# Patient Record
Sex: Male | Born: 1955 | Race: White | Hispanic: No | Smoking: Current every day smoker
Health system: Southern US, Community
[De-identification: ages and names within clinical notes are randomized; demographics above are authoritative.]

## PROBLEM LIST (undated history)

## (undated) DIAGNOSIS — J449 Chronic obstructive pulmonary disease, unspecified: Secondary | ICD-10-CM

## (undated) DIAGNOSIS — M1990 Unspecified osteoarthritis, unspecified site: Secondary | ICD-10-CM

## (undated) HISTORY — PX: OTHER SURGICAL HISTORY: SHX169

---

## 1998-07-07 ENCOUNTER — Emergency Department (HOSPITAL_COMMUNITY): Admission: EM | Admit: 1998-07-07 | Discharge: 1998-07-07 | Payer: Self-pay | Admitting: Emergency Medicine

## 1998-07-07 ENCOUNTER — Encounter: Payer: Self-pay | Admitting: Emergency Medicine

## 2014-02-27 ENCOUNTER — Emergency Department: Payer: Self-pay | Admitting: Emergency Medicine

## 2014-02-27 LAB — CBC
HCT: 43.7 % (ref 40.0–52.0)
HGB: 14.8 g/dL (ref 13.0–18.0)
MCH: 34.6 pg — ABNORMAL HIGH (ref 26.0–34.0)
MCHC: 33.7 g/dL (ref 32.0–36.0)
MCV: 103 fL — ABNORMAL HIGH (ref 80–100)
Platelet: 320 10*3/uL (ref 150–440)
RBC: 4.26 10*6/uL — ABNORMAL LOW (ref 4.40–5.90)
RDW: 13.3 % (ref 11.5–14.5)
WBC: 6.6 10*3/uL (ref 3.8–10.6)

## 2014-02-27 LAB — PROTIME-INR
INR: 0.9
Prothrombin Time: 12.3 secs (ref 11.5–14.7)

## 2016-01-16 ENCOUNTER — Encounter: Payer: Self-pay | Admitting: Emergency Medicine

## 2016-01-16 ENCOUNTER — Emergency Department: Payer: BLUE CROSS/BLUE SHIELD

## 2016-01-16 ENCOUNTER — Inpatient Hospital Stay
Admission: EM | Admit: 2016-01-16 | Discharge: 2016-01-18 | DRG: 191 | Disposition: A | Discharge status: Home / Self Care | Payer: BLUE CROSS/BLUE SHIELD | Attending: Internal Medicine | Admitting: Internal Medicine

## 2016-01-16 DIAGNOSIS — E871 Hypo-osmolality and hyponatremia: Secondary | ICD-10-CM | POA: Diagnosis present

## 2016-01-16 DIAGNOSIS — F17210 Nicotine dependence, cigarettes, uncomplicated: Secondary | ICD-10-CM | POA: Diagnosis present

## 2016-01-16 DIAGNOSIS — J440 Chronic obstructive pulmonary disease with acute lower respiratory infection: Principal | ICD-10-CM | POA: Diagnosis present

## 2016-01-16 DIAGNOSIS — R0902 Hypoxemia: Secondary | ICD-10-CM

## 2016-01-16 DIAGNOSIS — Z8249 Family history of ischemic heart disease and other diseases of the circulatory system: Secondary | ICD-10-CM

## 2016-01-16 DIAGNOSIS — J441 Chronic obstructive pulmonary disease with (acute) exacerbation: Secondary | ICD-10-CM | POA: Diagnosis present

## 2016-01-16 DIAGNOSIS — J209 Acute bronchitis, unspecified: Secondary | ICD-10-CM | POA: Diagnosis present

## 2016-01-16 DIAGNOSIS — R0602 Shortness of breath: Secondary | ICD-10-CM | POA: Diagnosis present

## 2016-01-16 HISTORY — DX: Chronic obstructive pulmonary disease, unspecified: J44.9

## 2016-01-16 LAB — CBC
HCT: 46.2 % (ref 40.0–52.0)
HEMOGLOBIN: 15.9 g/dL (ref 13.0–18.0)
MCH: 34.3 pg — AB (ref 26.0–34.0)
MCHC: 34.4 g/dL (ref 32.0–36.0)
MCV: 99.8 fL (ref 80.0–100.0)
PLATELETS: 285 10*3/uL (ref 150–440)
RBC: 4.63 MIL/uL (ref 4.40–5.90)
RDW: 13.1 % (ref 11.5–14.5)
WBC: 5 10*3/uL (ref 3.8–10.6)

## 2016-01-16 LAB — TROPONIN I

## 2016-01-16 LAB — BASIC METABOLIC PANEL
ANION GAP: 9 (ref 5–15)
BUN: <5 mg/dL — ABNORMAL LOW (ref 6–20)
CALCIUM: 8.5 mg/dL — AB (ref 8.9–10.3)
CO2: 29 mmol/L (ref 22–32)
CREATININE: 0.41 mg/dL — AB (ref 0.61–1.24)
Chloride: 92 mmol/L — ABNORMAL LOW (ref 101–111)
Glucose, Bld: 125 mg/dL — ABNORMAL HIGH (ref 65–99)
Potassium: 3.1 mmol/L — ABNORMAL LOW (ref 3.5–5.1)
SODIUM: 130 mmol/L — AB (ref 135–145)

## 2016-01-16 LAB — OSMOLALITY: Osmolality: 274 mOsm/kg — ABNORMAL LOW (ref 275–295)

## 2016-01-16 LAB — BRAIN NATRIURETIC PEPTIDE: B NATRIURETIC PEPTIDE 5: 97 pg/mL (ref 0.0–100.0)

## 2016-01-16 MED ORDER — ACETAMINOPHEN 650 MG RE SUPP: 650.0000 mg | Freq: Four times a day (QID) | RECTAL | Status: DC | PRN

## 2016-01-16 MED ORDER — MOMETASONE FURO-FORMOTEROL FUM 200-5 MCG/ACT IN AERO
2.0000 | INHALATION_SPRAY | Freq: Two times a day (BID) | RESPIRATORY_TRACT | Status: DC
  Administered 2016-01-16 – 2016-01-18 (×4): 2 via RESPIRATORY_TRACT
  Filled 2016-01-16: qty 8.8

## 2016-01-16 MED ORDER — SODIUM CHLORIDE 0.9 % IV SOLN
INTRAVENOUS | Status: DC
  Administered 2016-01-16 – 2016-01-17 (×3): via INTRAVENOUS

## 2016-01-16 MED ORDER — SODIUM CHLORIDE 0.9% FLUSH
3.0000 mL | Freq: Two times a day (BID) | INTRAVENOUS | Status: DC
  Administered 2016-01-17: 3 mL via INTRAVENOUS

## 2016-01-16 MED ORDER — TIOTROPIUM BROMIDE MONOHYDRATE 18 MCG IN CAPS
18.0000 ug | ORAL_CAPSULE | Freq: Every day | RESPIRATORY_TRACT | Status: DC
  Administered 2016-01-16 – 2016-01-18 (×3): 18 ug via RESPIRATORY_TRACT
  Filled 2016-01-16 (×2): qty 5

## 2016-01-16 MED ORDER — IPRATROPIUM-ALBUTEROL 0.5-2.5 (3) MG/3ML IN SOLN
3.0000 mL | Freq: Four times a day (QID) | RESPIRATORY_TRACT | Status: DC
  Administered 2016-01-16 – 2016-01-18 (×7): 3 mL via RESPIRATORY_TRACT
  Filled 2016-01-16 (×7): qty 3

## 2016-01-16 MED ORDER — IPRATROPIUM-ALBUTEROL 0.5-2.5 (3) MG/3ML IN SOLN
3.0000 mL | Freq: Once | RESPIRATORY_TRACT | Status: AC
  Administered 2016-01-16: 3 mL via RESPIRATORY_TRACT
  Filled 2016-01-16: qty 3

## 2016-01-16 MED ORDER — LEVOFLOXACIN IN D5W 750 MG/150ML IV SOLN
750.0000 mg | Freq: Once | INTRAVENOUS | Status: AC
  Administered 2016-01-16: 750 mg via INTRAVENOUS
  Filled 2016-01-16: qty 150

## 2016-01-16 MED ORDER — ACETAMINOPHEN 325 MG PO TABS: 650.0000 mg | ORAL_TABLET | Freq: Four times a day (QID) | ORAL | Status: DC | PRN

## 2016-01-16 MED ORDER — INFLUENZA VAC SPLIT QUAD 0.5 ML IM SUSY: 0.5000 mL | PREFILLED_SYRINGE | INTRAMUSCULAR | Status: DC

## 2016-01-16 MED ORDER — METHYLPREDNISOLONE SODIUM SUCC 125 MG IJ SOLR
60.0000 mg | Freq: Four times a day (QID) | INTRAMUSCULAR | Status: DC
  Administered 2016-01-17 – 2016-01-18 (×6): 60 mg via INTRAVENOUS
  Filled 2016-01-16 (×6): qty 2

## 2016-01-16 MED ORDER — ENOXAPARIN SODIUM 40 MG/0.4ML ~~LOC~~ SOLN: 40.0000 mg | SUBCUTANEOUS | Status: DC

## 2016-01-16 MED ORDER — METHYLPREDNISOLONE SODIUM SUCC 125 MG IJ SOLR
60.0000 mg | Freq: Four times a day (QID) | INTRAMUSCULAR | Status: DC
  Administered 2016-01-16: 60 mg via INTRAVENOUS
  Filled 2016-01-16: qty 2

## 2016-01-16 MED ORDER — ENOXAPARIN SODIUM 30 MG/0.3ML ~~LOC~~ SOLN
30.0000 mg | SUBCUTANEOUS | Status: DC
  Administered 2016-01-16 – 2016-01-17 (×2): 30 mg via SUBCUTANEOUS
  Filled 2016-01-16 (×2): qty 0.3

## 2016-01-16 MED ORDER — PNEUMOCOCCAL VAC POLYVALENT 25 MCG/0.5ML IJ INJ: 0.5000 mL | INJECTION | INTRAMUSCULAR | Status: DC

## 2016-01-16 MED ORDER — LEVOFLOXACIN IN D5W 750 MG/150ML IV SOLN: 750.0000 mg | Freq: Once | INTRAVENOUS | Status: DC

## 2016-01-16 MED ORDER — POTASSIUM CHLORIDE CRYS ER 20 MEQ PO TBCR
40.0000 meq | EXTENDED_RELEASE_TABLET | ORAL | Status: AC
  Administered 2016-01-16 (×2): 40 meq via ORAL
  Filled 2016-01-16 (×2): qty 2

## 2016-01-16 MED ORDER — ONDANSETRON HCL 4 MG/2ML IJ SOLN: 4.0000 mg | Freq: Four times a day (QID) | INTRAMUSCULAR | Status: DC | PRN

## 2016-01-16 MED ORDER — ONDANSETRON HCL 4 MG PO TABS: 4.0000 mg | ORAL_TABLET | Freq: Four times a day (QID) | ORAL | Status: DC | PRN

## 2016-01-16 MED ORDER — NICOTINE 21 MG/24HR TD PT24
21.0000 mg | MEDICATED_PATCH | Freq: Every day | TRANSDERMAL | Status: DC
  Administered 2016-01-16 – 2016-01-18 (×3): 21 mg via TRANSDERMAL
  Filled 2016-01-16 (×3): qty 1

## 2016-01-16 MED ORDER — LEVOFLOXACIN IN D5W 500 MG/100ML IV SOLN
500.0000 mg | INTRAVENOUS | Status: DC
  Administered 2016-01-17 – 2016-01-18 (×2): 500 mg via INTRAVENOUS
  Filled 2016-01-16 (×2): qty 100

## 2016-01-16 MED ORDER — SODIUM CHLORIDE 0.9% FLUSH: 3.0000 mL | INTRAVENOUS | Status: DC | PRN

## 2016-01-16 MED ORDER — METHYLPREDNISOLONE SODIUM SUCC 125 MG IJ SOLR
125.0000 mg | Freq: Once | INTRAMUSCULAR | Status: AC
  Administered 2016-01-16: 125 mg via INTRAVENOUS
  Filled 2016-01-16: qty 2

## 2016-01-16 MED ORDER — SODIUM CHLORIDE 0.9 % IV SOLN
250.0000 mL | INTRAVENOUS | Status: DC | PRN
Start: 2016-01-16 — End: 2016-01-18

## 2016-01-16 NOTE — ED Notes (Signed)
EKG report given to MD.

## 2016-01-16 NOTE — H&P (Addendum)
[facility] [place] at [facility]   PATIENT NAME: Michael Padilla    MR#:  [id number]  DATE OF BIRTH:  Apr 07, 1956  DATE OF ADMISSION:  01/16/2016  PRIMARY CARE PHYSICIAN: No primary care provider on file.   REQUESTING/REFERRING PHYSICIAN: McShane MD  CHIEF COMPLAINT:   Chief Complaint  Patient presents with  . Shortness of Breath    HISTORY OF PRESENT ILLNESS: Michael Padilla  is a 60 y.o. male with a known history of  COPD and nicotine addiction who presents with shortness of breath and cough for one weeks duration. Patient reports that he has a history of COPD and has not use any medications in the past 7-8 years. He continues to smoke more than a pack a day. He reports that his been coughing and wheezing. Has not had any fevers or chills. Denies any chest pains.  PAST MEDICAL HISTORY:   Past Medical History  Diagnosis Date  . COPD (chronic obstructive pulmonary disease) (HCC)     PAST SURGICAL HISTORY: Past Surgical History  Procedure Laterality Date  . None      SOCIAL HISTORY:  Social History  Substance Use Topics  . Smoking status: Current Every Day Smoker -- 1.00 packs/day  . Smokeless tobacco: Not on file  . Alcohol Use: 0.6 oz/week    1 Standard drinks or equivalent per week    FAMILY HISTORY:  Family History  Problem Relation Age of Onset  . Hypertension Mother     DRUG ALLERGIES: No Known Allergies  REVIEW OF SYSTEMS:   CONSTITUTIONAL: No fever, fatigue or weakness.  EYES: No blurred or double vision.  EARS, NOSE, AND THROAT: No tinnitus or ear pain.  RESPIRATORY: Positive cough and shortness of breath, no wheezing or hemoptysis.  CARDIOVASCULAR: No chest pain, orthopnea, edema.  GASTROINTESTINAL: No nausea, vomiting, diarrhea or abdominal pain.  GENITOURINARY: No dysuria, hematuria.  ENDOCRINE: No polyuria, nocturia,  HEMATOLOGY: No anemia, easy bruising or bleeding SKIN: No rash or lesion. MUSCULOSKELETAL: No joint pain  or arthritis.   NEUROLOGIC: No tingling, numbness, weakness.  PSYCHIATRY: No anxiety or depression.   MEDICATIONS AT HOME:  Prior to Admission medications   Not on File      PHYSICAL EXAMINATION:   VITAL SIGNS: Blood pressure 112/82, pulse 73, temperature 97.6 F (36.4 C), temperature source Oral, resp. rate 18, height 5\' 7"  (1.702 m), weight 55.339 kg (122 lb), SpO2 92 %.  GENERAL:  60 y.o.-year-old patient lying in the bed with no acute distress.  EYES: Pupils equal, round, reactive to light and accommodation. No scleral icterus. Extraocular muscles intact.  HEENT: Head atraumatic, normocephalic. Oropharynx and nasopharynx clear.  NECK:  Supple, no jugular venous distention. No thyroid enlargement, no tenderness.  LUNGS: Decreased breath sounds bilaterally without any rales rhonchi or wheezing  CARDIOVASCULAR: S1, S2 normal. No murmurs, rubs, or gallops.  ABDOMEN: Soft, nontender, nondistended. Bowel sounds present. No organomegaly or mass.  EXTREMITIES: No pedal edema, cyanosis, or clubbing.  NEUROLOGIC: Cranial nerves II through XII are intact. Muscle strength 5/5 in all extremities. Sensation intact. Gait not checked.  PSYCHIATRIC: The patient is alert and oriented x 3.  SKIN: No obvious rash, lesion, or ulcer.   LABORATORY PANEL:   CBC  Recent Labs Lab 01/16/16 1130  WBC 5.0  HGB 15.9  HCT 46.2  PLT 285  MCV 99.8  MCH 34.3*  MCHC 34.4  RDW 13.1   ------------------------------------------------------------------------------------------------------------------  Chemistries   Recent Labs Lab 01/16/16 1130  NA 130*  K 3.1*  CL 92*  CO2 29  GLUCOSE 125*  BUN <5*  CREATININE 0.41*  CALCIUM 8.5*   ------------------------------------------------------------------------------------------------------------------ estimated creatinine clearance is 76.8 mL/min (by C-G formula based on Cr of  0.41). ------------------------------------------------------------------------------------------------------------------ No results for input(s): TSH, T4TOTAL, T3FREE, THYROIDAB in the last 72 hours.  Invalid input(s): FREET3   Coagulation profile No results for input(s): INR, PROTIME in the last 168 hours. ------------------------------------------------------------------------------------------------------------------- No results for input(s): DDIMER in the last 72 hours. -------------------------------------------------------------------------------------------------------------------  Cardiac Enzymes  Recent Labs Lab 01/16/16 1130 01/16/16 1404  TROPONINI <0.03 <0.03   ------------------------------------------------------------------------------------------------------------------ Invalid input(s): POCBNP  ---------------------------------------------------------------------------------------------------------------  Urinalysis No results found for: COLORURINE, APPEARANCEUR, LABSPEC, PHURINE, GLUCOSEU, HGBUR, BILIRUBINUR, KETONESUR, PROTEINUR, UROBILINOGEN, NITRITE, LEUKOCYTESUR   RADIOLOGY: Dg Chest 2 View  01/16/2016  CLINICAL DATA:  Cough and weakness for 1 week.  Smoker. EXAM: CHEST  2 VIEW COMPARISON:  None. FINDINGS: Advanced COPD with hyperinflation. No areas of consolidation or volume loss. Chronic appearing CP angle blunting. Normal heart size. Thoracic atherosclerosis. Coronary artery calcification. Thoracic spine degenerative change. IMPRESSION: Advanced COPD.  No definite active infiltrates or failure. Electronically Signed   By: Elsie Stain M.D.   On: 01/16/2016 11:46    EKG: Orders placed or performed during the hospital encounter of 01/16/16  . ED EKG  . ED EKG    IMPRESSION AND PLAN: Patient is a 60 year old with nicotine addiction presents with shortness of breath and cough  1. Acute on chronic COPD exasperation: At this time will admit the patient  to the hospital place him on nebulizers and Solu-Medrol. And antibiotics for acute bronchitis. I will start him on inhalers and use as well. Patient will  need outpatient pulmonary follow-up.  2. Acute bronchitis levaquin   3. Hyponatremia could be due to dehydration also SIADH possible with his severe COPD changes on the x-ray at this time will try normal saline. Recheck potassium in the morning I'll also check serum and urine osmolalities  4. Nicotine addiction: smoking cessation provided 4 min spent, nicotine patch started      All the records are reviewed and case discussed with ED provider. Management plans discussed with the patient, family and they are in agreement.  CODE STATUS: Code Status History    This patient does not have a recorded code status. Please follow your organizational policy for patients in this situation.       TOTAL TIME TAKING CARE OF THIS PATIENT: 55 minutes.    Auburn Bilberry M.D on 01/16/2016 at 4:13 PM  Between 7am to 6pm - Pager - [phone]  After 6pm go to [email or web address] - password [email or web address] [facility]  [place] [facility]  Office  [phone]  CC: Primary care physician; No primary care provider on file.

## 2016-01-16 NOTE — ED Notes (Signed)
Pt placed on 2L O2 via n/c for O2 sat 86-87% by MD.

## 2016-01-16 NOTE — ED Notes (Signed)
SOB x 1 week, denies fevers.

## 2016-01-16 NOTE — ED Provider Notes (Addendum)
[facility] Emergency Department Provider Note  ____________________________________________   I have reviewed the triage vital signs and the nursing notes.   HISTORY  Chief Complaint Shortness of Breath    HPI Michael Padilla is a 60 y.o. male who presents today complaining of cough. He has a history of tobacco abuse for multiple years. He states he has been having a cough for the last week which is deep and productive occasionally. No fever. He went to an urgent care and they told him that his socks and sats were somewhat low and he was sent here. Patient denies any chest pain, no lower extremity edema. He does state he is getting winded when he walks around. This is she states because of the cough. He denies any nausea or diaphoresis. No other URI symptoms. Does not know himself to have a history of COPD. Symptoms of been there for approximately one week.  History reviewed. No pertinent past medical history.  There are no active problems to display for this patient.   History reviewed. No pertinent past surgical history.  No current outpatient prescriptions on file.  Allergies Review of patient's allergies indicates no known allergies.  No family history on file.  Social History Social History  Substance Use Topics  . Smoking status: Current Every Day Smoker -- 1.00 packs/day  . Smokeless tobacco: None  . Alcohol Use: Yes    Review of Systems Constitutional: No fever/chills Eyes: No visual changes. ENT: No sore throat. No stiff neck no neck pain Cardiovascular: Denies chest pain. Respiratory: See history of present illness or shortness of breath Gastrointestinal:   no vomiting.  No diarrhea.  No constipation. Genitourinary: Negative for dysuria. Musculoskeletal: Negative lower extremity swelling Skin: Negative for rash. Neurological: Negative for headaches, focal weakness or numbness. 10-point ROS otherwise  negative.  ____________________________________________   PHYSICAL EXAM:  VITAL SIGNS: ED Triage Vitals  Enc Vitals Group     BP 01/16/16 1101 156/90 mmHg     Pulse Rate 01/16/16 1101 93     Resp 01/16/16 1101 22     Temp 01/16/16 1101 97.6 F (36.4 C)     Temp Source 01/16/16 1101 Oral     SpO2 01/16/16 1101 93 %     Weight 01/16/16 1101 122 lb (55.339 kg)     Height 01/16/16 1101  (1.702 m)     Head Cir --      Peak Flow --      Pain Score --      Pain Loc --      Pain Edu? --      Excl. in GC? --     Constitutional: Alert and oriented. Well appearing and in no acute distress.Sitting in the chair reading a newspaper with his legs crossed Eyes: Conjunctivae are normal. PERRL. EOMI. Head: Atraumatic. Nose: No congestion/rhinnorhea. Mouth/Throat: Mucous membranes are moist.  Oropharynx non-erythematous. Neck: No stridor.   Nontender with no meningismus Cardiovascular: Normal rate, regular rhythm. Grossly normal heart sounds.  Good peripheral circulation. Respiratory: Normal respiratory effort.  No retractions. Diminished in the bases otherwise no acute pathology Abdominal: Soft and nontender. No distention. No guarding no rebound Back:  There is no focal tenderness or step off there is no midline tenderness there are no lesions noted. there is no CVA tenderness Musculoskeletal: No lower extremity tenderness. No joint effusions, no DVT signs strong distal pulses no edema Neurologic:  Normal speech and language. No gross focal neurologic deficits are appreciated.  Skin:  Skin is warm, dry and intact. No rash noted. Psychiatric: Mood and affect are normal. Speech and behavior are normal.  ____________________________________________   LABS (all labs ordered are listed, but only abnormal results are displayed)  Labs Reviewed  CBC - Abnormal; Notable for the following:    MCH 34.3 (*)    All other components within normal limits  BASIC METABOLIC PANEL  TROPONIN I   BRAIN NATRIURETIC PEPTIDE   ____________________________________________  EKG  I personally interpreted any EKGs ordered by me or triage Normal sinus rhythm rate 74 bpm, there is are BB and LAFB. There is repolarization appearing with good morphology but nonetheless present S relation to 3 and aVF which I do not take to be acutely ischemic ____________________________________________  RADIOLOGY  I reviewed any imaging ordered by me or triage that were performed during my shift and, if possible, patient and/or family made aware of any abnormal findings. ____________________________________________   PROCEDURES  Procedure(s) performed: None  Critical Care performed: None  ____________________________________________   INITIAL IMPRESSION / ASSESSMENT AND PLAN / ED COURSE  Pertinent labs & imaging results that were available during my care of the patient were reviewed by me and considered in my medical decision making (see chart for details).  Patient here with a cough. We gave him an albuterol treatment is sats of improved. He is in no acute distress. There is no evidence of pneumonia on chest x-ray. He is not having any chest pain however he does have exertional dyspnea has been smoking for years. I suspect this is likely to a COPD flare and we will treat it as such however as a precaution I did do an EKG and cardiac enzymes because of his significant smoking history. If troponin is negative, we'll reassess. Patient in no distress and has had no chest pain. EKG is noted, this is not known to be acute, and again with no chest pain and no symptoms at this time I will not send to the Cath Lab based on this EKG  ----------------------------------------- 2:18 PM on 01/16/2016 -----------------------------------------  Workup reassuring here however patient persistently hypoxic. His sats are 6686 when I'm in the room after I ambulate him. Routinely he is down to 88-89%. This may be  baseline for him but it is not known to be. Family states that he has been much more sleepy recently. We will admit him for further evaluation of his persistent recurrent hypoxia while here. ____________________________________________   FINAL CLINICAL IMPRESSION(S) / ED DIAGNOSES  Final diagnoses:  None      This chart was dictated using voice recognition software.  Despite best efforts to proofread,  errors can occur which can change meaning.     Jeanmarie PlantJames A Anuja Manka, MD 01/16/16 1212  Jeanmarie PlantJames A Caylor Cerino, MD 01/16/16 1420

## 2016-01-17 LAB — BASIC METABOLIC PANEL
Anion gap: 7 (ref 5–15)
BUN: 6 mg/dL (ref 6–20)
CHLORIDE: 100 mmol/L — AB (ref 101–111)
CO2: 26 mmol/L (ref 22–32)
Calcium: 8.2 mg/dL — ABNORMAL LOW (ref 8.9–10.3)
Creatinine, Ser: 0.39 mg/dL — ABNORMAL LOW (ref 0.61–1.24)
GFR calc non Af Amer: >60 mL/min (ref >60)
Glucose, Bld: 160 mg/dL — ABNORMAL HIGH (ref 65–99)
POTASSIUM: 4.1 mmol/L (ref 3.5–5.1)
SODIUM: 133 mmol/L — AB (ref 135–145)

## 2016-01-17 LAB — CBC
HCT: 42 % (ref 40.0–52.0)
HEMOGLOBIN: 14.5 g/dL (ref 13.0–18.0)
MCH: 33.9 pg (ref 26.0–34.0)
MCHC: 34.5 g/dL (ref 32.0–36.0)
MCV: 98.5 fL (ref 80.0–100.0)
Platelets: 302 10*3/uL (ref 150–440)
RBC: 4.26 MIL/uL — AB (ref 4.40–5.90)
RDW: 13 % (ref 11.5–14.5)
WBC: 6.5 10*3/uL (ref 3.8–10.6)

## 2016-01-17 NOTE — Plan of Care (Signed)
Problem: Phase I Progression Outcomes Goal: Flu/PneumoVaccines if indicated Outcome: Not Met (add Reason) Pt refused  Problem: Discharge Progression Outcomes Goal: Flu vaccine received if indicated Outcome: Not Met (add Reason) Pt refused Goal: Pneumonia vaccine received if indicated Outcome: Not Met (add Reason) Pt refused

## 2016-01-17 NOTE — Progress Notes (Signed)
[facility] [place] at [facility]   PATIENT NAME: Michael Padilla    MR#:  [id number]  DATE OF BIRTH:  Feb 06, 1956  SUBJECTIVE:  Shortness of breath and cough improved, but still needs oxygen  REVIEW OF SYSTEMS:  CONSTITUTIONAL: No fever, fatigue or weakness.  EYES: No blurred or double vision.  EARS, NOSE, AND THROAT: No tinnitus or ear pain.  RESPIRATORY: positive cough, shortness of breath, wheezing denies hemoptysis.  CARDIOVASCULAR: No chest pain, orthopnea, edema.  GASTROINTESTINAL: No nausea, vomiting, diarrhea or abdominal pain.  GENITOURINARY: No dysuria, hematuria.  ENDOCRINE: No polyuria, nocturia,  HEMATOLOGY: No anemia, easy bruising or bleeding SKIN: No rash or lesion. MUSCULOSKELETAL: No joint pain or arthritis.   NEUROLOGIC: No tingling, numbness, weakness.  PSYCHIATRY: No anxiety or depression.   DRUG ALLERGIES:  No Known Allergies  VITALS:  Blood pressure 114/70, pulse 78, temperature 98.1 F (36.7 C), temperature source Oral, resp. rate 20, height 5\' 7"  (1.702 m), weight 53.298 kg (117 lb 8 oz), SpO2 95 %.  PHYSICAL EXAMINATION:  VITAL SIGNS: Filed Vitals:   01/17/16 0606 01/17/16 1300  BP: 117/72 114/70  Pulse: 70 78  Temp: 98 F (36.7 C) 98.1 F (36.7 C)  Resp: 18 20   GENERAL:60 y.o.male currently in no acute distress.  HEAD: Normocephalic, atraumatic.  EYES: Pupils equal, round, reactive to light. Extraocular muscles intact. No scleral icterus.  MOUTH: Moist mucosal membrane. Dentition intact. No abscess noted.  EAR, NOSE, THROAT: Clear without exudates. No external lesions.  NECK: Supple. No thyromegaly. No nodules. No JVD.  PULMONARY: scant expiratory wheeze without rails or rhonci. No use of accessory muscles, Good respiratory effort. good air entry bilaterally CHEST: Nontender to palpation.  CARDIOVASCULAR: S1 and S2. Regular rate and rhythm. No murmurs, rubs, or gallops. No edema. Pedal pulses 2+ bilaterally.   GASTROINTESTINAL: Soft, nontender, nondistended. No masses. Positive bowel sounds. No hepatosplenomegaly.  MUSCULOSKELETAL: No swelling, clubbing, or edema. Range of motion full in all extremities.  NEUROLOGIC: Cranial nerves II through XII are intact. No gross focal neurological deficits. Sensation intact. Reflexes intact.  SKIN: No ulceration, lesions, rashes, or cyanosis. Skin warm and dry. Turgor intact.  PSYCHIATRIC: Mood, affect within normal limits. The patient is awake, alert and oriented x 3. Insight, judgment intact.      LABORATORY PANEL:   CBC  Recent Labs Lab 01/17/16 0306  WBC 6.5  HGB 14.5  HCT 42.0  PLT 302   ------------------------------------------------------------------------------------------------------------------  Chemistries   Recent Labs Lab 01/17/16 0306  NA 133*  K 4.1  CL 100*  CO2 26  GLUCOSE 160*  BUN 6  CREATININE 0.39*  CALCIUM 8.2*   ------------------------------------------------------------------------------------------------------------------  Cardiac Enzymes  Recent Labs Lab 01/16/16 1404  TROPONINI <0.03   ------------------------------------------------------------------------------------------------------------------  RADIOLOGY:  Dg Chest 2 View  01/16/2016  CLINICAL DATA:  Cough and weakness for 1 week.  Smoker. EXAM: CHEST  2 VIEW COMPARISON:  None. FINDINGS: Advanced COPD with hyperinflation. No areas of consolidation or volume loss. Chronic appearing CP angle blunting. Normal heart size. Thoracic atherosclerosis. Coronary artery calcification. Thoracic spine degenerative change. IMPRESSION: Advanced COPD.  No definite active infiltrates or failure. Electronically Signed   By: Elsie StainJohn T Curnes M.D.   On: 01/16/2016 11:46    EKG:   Orders placed or performed during the hospital encounter of 01/16/16  . ED EKG  . ED EKG    ASSESSMENT AND PLAN:   60 year old caucasian male admitted 01/16/16 with copd exacerbation  1. Chronic obstructive pulmonary disease exacerbation: Provide DuoNeb treatments q. 4 hours, decrease steroids, continue levaquin 3/5. Continue with home medications.  2. Hyponatremia: gentle iv fluids, decrease rate 3. Venous thromboembolism prophylatic: lovenox       All the records are reviewed and case discussed with Care Management/Social Workerr. Management plans discussed with the patient, family and they are in agreement.  CODE STATUS: full  TOTAL TIME TAKING CARE OF THIS PATIENT: 28 minutes.   POSSIBLE D/C IN 1-2 DAYS, DEPENDING ON CLINICAL CONDITION.   [name],  [name].D on 01/17/2016 at 3:15 PM  Between 7am to 6pm - Pager - [phone]  After 6pm: House Pager: - [phone]  [name]  Office  [phone]  CC: Primary care physician; No primary care provider on file.

## 2016-01-18 MED ORDER — PREDNISONE 10 MG (21) PO TBPK: 10.0000 mg | ORAL_TABLET | Freq: Every day | ORAL | Status: DC

## 2016-01-18 MED ORDER — MOMETASONE FURO-FORMOTEROL FUM 200-5 MCG/ACT IN AERO: 2.0000 | INHALATION_SPRAY | Freq: Two times a day (BID) | RESPIRATORY_TRACT | Status: DC

## 2016-01-18 MED ORDER — TIOTROPIUM BROMIDE MONOHYDRATE 18 MCG IN CAPS: 18.0000 ug | ORAL_CAPSULE | Freq: Every day | RESPIRATORY_TRACT | Status: DC

## 2016-01-18 NOTE — Progress Notes (Signed)
To whomever it may concern  Michael Padilla  [facility] 01/16/2016 -- 01/18/2016 and will be able to return to work 01/21/16 unless feels better and thinks is able to return sooner Please call with concerns/questions Marge Duncansave Kimberli Winne MD [phone]

## 2016-01-18 NOTE — Discharge Summary (Signed)
[facility] - [place] at [facility]   PATIENT NAME: Michael Padilla    MR#:  [id number]  DATE OF BIRTH:  Mar 06, 1956  DATE OF ADMISSION:  01/16/2016 ADMITTING PHYSICIAN: Auburn Bilberry, MD  DATE OF DISCHARGE: No discharge date for patient encounter.  PRIMARY CARE PHYSICIAN: No primary care provider on file.    ADMISSION DIAGNOSIS:  Hypoxia [R09.02] Chronic obstructive pulmonary disease with acute exacerbation (HCC) [J44.1]  DISCHARGE DIAGNOSIS:  COPD exacerbation-improved  SECONDARY DIAGNOSIS:   Past Medical History  Diagnosis Date  . COPD (chronic obstructive pulmonary disease) [facility])     HOSPITAL COURSE:  Michael Padilla  is a 60 y.o. male admitted 01/16/2016 with chief complaint shortness of breath. Please see H&P performed by Dr. Allena Katz for further information. On admission patient started on breathing treatments, steroids as well as Levaquin for COPD exacerbation. He originally required oxygen supplementation to maintain oxygen saturation. Throughout hospitalization he has made improvement and is stable for discharge.  DISCHARGE CONDITIONS:   Stable/improved  CONSULTS OBTAINED:     DRUG ALLERGIES:  No Known Allergies  DISCHARGE MEDICATIONS:   Current Discharge Medication List    START taking these medications   Details  mometasone-formoterol (DULERA) 200-5 MCG/ACT AERO Inhale 2 puffs into the lungs 2 (two) times daily. Qty: 8.8 g, Refills: 0    predniSONE (STERAPRED UNI-PAK 21 TAB) 10 MG (21) TBPK tablet Take 1 tablet (10 mg total) by mouth daily.  oral 1 day, then  oral for 2 days, then  oral 2 days, then stop Qty: 10 tablet, Refills: 0    tiotropium (SPIRIVA) 18 MCG inhalation capsule Place 1 capsule (18 mcg total) into inhaler and inhale daily. Qty: 30 capsule, Refills: 12         DISCHARGE INSTRUCTIONS:    DIET:  Regular diet  DISCHARGE CONDITION:  Stable  ACTIVITY:  Activity as tolerated  OXYGEN:  Home  Oxygen: No.   Oxygen Delivery: room air  DISCHARGE LOCATION:  home   If you experience worsening of your admission symptoms, develop shortness of breath, life threatening emergency, suicidal or homicidal thoughts you must seek medical attention immediately by calling 911 or calling your MD immediately  if symptoms less severe.  You Must read complete instructions/literature along with all the possible adverse reactions/side effects for all the Medicines you take and that have been prescribed to you. Take any new Medicines after you have completely understood and accpet all the possible adverse reactions/side effects.   Please note  You were cared for by a hospitalist during your hospital stay. If you have any questions about your discharge medications or the care you received while you were in the hospital after you are discharged, you can call the unit and asked to speak with the hospitalist on call if the hospitalist that took care of you is not available. Once you are discharged, your primary care physician will handle any further medical issues. Please note that NO REFILLS for any discharge medications will be authorized once you are discharged, as it is imperative that you return to your primary care physician (or establish a relationship with a primary care physician if you do not have one) for your aftercare needs so that they can reassess your need for medications and monitor your lab values.    On the day of Discharge:   VITAL SIGNS:  Blood pressure 131/77, pulse 87, temperature 97.9 F (36.6 C), temperature source Axillary, resp. rate 17, height  (1.702 m),  weight 53.298 kg (117 lb 8 oz), SpO2 91 %.  I/O:   Intake/Output Summary (Last 24 hours) at 01/18/16 0858 Last data filed at 01/18/16 [id number]  Gross per 24 hour  Intake 3149.05 ml  Output   1450 ml  Net 1699.05 ml    PHYSICAL EXAMINATION:  GENERAL:  60 y.o.-year-old patient lying in the bed with no acute distress.   EYES: Pupils equal, round, reactive to light and accommodation. No scleral icterus. Extraocular muscles intact.  HEENT: Head atraumatic, normocephalic. Oropharynx and nasopharynx clear.  NECK:  Supple, no jugular venous distention. No thyroid enlargement, no tenderness.  LUNGS: Normal breath sounds bilaterally, no wheezing, rales,rhonchi or crepitation. No use of accessory muscles of respiration.  CARDIOVASCULAR: S1, S2 normal. No murmurs, rubs, or gallops.  ABDOMEN: Soft, non-tender, non-distended. Bowel sounds present. No organomegaly or mass.  EXTREMITIES: No pedal edema, cyanosis, or clubbing.  NEUROLOGIC: Cranial nerves II through XII are intact. Muscle strength 5/5 in all extremities. Sensation intact. Gait not checked.  PSYCHIATRIC: The patient is alert and oriented x 3.  SKIN: No obvious rash, lesion, or ulcer.   DATA REVIEW:   CBC  Recent Labs Lab 01/17/16 0306  WBC 6.5  HGB 14.5  HCT 42.0  PLT 302    Chemistries   Recent Labs Lab 01/17/16 0306  NA 133*  K 4.1  CL 100*  CO2 26  GLUCOSE 160*  BUN 6  CREATININE 0.39*  CALCIUM 8.2*    Cardiac Enzymes  Recent Labs Lab 01/16/16 1404  TROPONINI <0.03    Microbiology Results  No results found for this or any previous visit.  RADIOLOGY:  Dg Chest 2 View  01/16/2016  CLINICAL DATA:  Cough and weakness for 1 week.  Smoker. EXAM: CHEST  2 VIEW COMPARISON:  None. FINDINGS: Advanced COPD with hyperinflation. No areas of consolidation or volume loss. Chronic appearing CP angle blunting. Normal heart size. Thoracic atherosclerosis. Coronary artery calcification. Thoracic spine degenerative change. IMPRESSION: Advanced COPD.  No definite active infiltrates or failure. Electronically Signed   By: Elsie StainJohn T Curnes M.D.   On: 01/16/2016 11:46     Management plans discussed with the patient, family and they are in agreement.  CODE STATUS:     Code Status Orders        Start     Ordered   01/16/16 1747  Full code    Continuous     01/16/16 1746    Code Status History    Date Active Date Inactive Code Status Order ID Comments User Context   This patient has a current code status but no historical code status.      TOTAL TIME TAKING CARE OF THIS PATIENT: 28 minutes.    Keyasia Jolliff,  Mardi MainlandDavid K M.D on 01/18/2016 at 8:58 AM  Between 7am to 6pm - Pager - [phone]  After 6pm go to [email or web address] - password [email or web address] [facility]  [facility]  Office  [phone]  CC: Primary care physician; No primary care provider on file.

## 2016-01-18 NOTE — Progress Notes (Signed)
Pt A and O x 4. VSS. Pt tolerating diet well. No complaints of pain or nausea. IV removed intact, prescriptions given. Pt voiced understanding of discharge instructions with no further questions. Pt discharged via wheelchair with axillary.   

## 2016-01-18 NOTE — Progress Notes (Signed)
Notified Dr. Clint GuyHower of O2 sats on exertion. No complaints of shortness of breath. 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 MD believes that pt does not need home O2.

## 2016-12-16 ENCOUNTER — Emergency Department
Admission: EM | Admit: 2016-12-16 | Discharge: 2016-12-16 | Disposition: A | Discharge status: Home / Self Care | Payer: BLUE CROSS/BLUE SHIELD | Attending: Emergency Medicine | Admitting: Emergency Medicine

## 2016-12-16 ENCOUNTER — Emergency Department: Payer: BLUE CROSS/BLUE SHIELD

## 2016-12-16 DIAGNOSIS — M79605 Pain in left leg: Secondary | ICD-10-CM | POA: Diagnosis present

## 2016-12-16 DIAGNOSIS — F17200 Nicotine dependence, unspecified, uncomplicated: Secondary | ICD-10-CM | POA: Diagnosis not present

## 2016-12-16 DIAGNOSIS — J449 Chronic obstructive pulmonary disease, unspecified: Secondary | ICD-10-CM | POA: Insufficient documentation

## 2016-12-16 DIAGNOSIS — M5410 Radiculopathy, site unspecified: Secondary | ICD-10-CM | POA: Diagnosis not present

## 2016-12-16 MED ORDER — MELOXICAM 15 MG PO TABS: 15.0000 mg | ORAL_TABLET | Freq: Every day | ORAL | 0 refills | Status: DC

## 2016-12-16 NOTE — ED Notes (Signed)
Pt c/o L leg pain that started yesterday while he was in the shower. Pt c/o pain from L hip to L knee that is sharp shooting in nature, and heaviness in his calf. Denies any redness, swelling, heat to the touch. Pt is able to walk with some difficulty at this time.

## 2016-12-16 NOTE — Discharge Instructions (Signed)
Your x-rays show some degenerative changes in your lower back. Follow up with either your primary care doctor or orthopedics. Take the meloxicam daily.

## 2016-12-16 NOTE — ED Provider Notes (Signed)
[facility] Emergency Department Provider Note ____________________________________________  Time seen: Approximately 12:58 PM  I have reviewed the triage vital signs and the nursing notes.   HISTORY  Chief Complaint Leg Pain    HPI Michael Padilla is a 61 y.o. male who presents to the emergency department for evaluation of left leg pain. He states that yesterday, he was getting out of the shower and his leg "gave out." He states that today he has had sharp shooting pains down the front of his leg. He denies hip pain or knee pain. He does have a history of low back pain that radiated into his right leg in the past, but never the left. He has not taken any medications for his current symptoms.   Past Medical History:  Diagnosis Date  . COPD (chronic obstructive pulmonary disease) [facility])     Patient Active Problem List   Diagnosis Date Noted  . SOB (shortness of breath) 01/16/2016    Past Surgical History:  Procedure Laterality Date  . none      Prior to Admission medications   Medication Sig Start Date End Date Taking? Authorizing Provider  meloxicam (MOBIC) 15 MG tablet Take 1 tablet (15 mg total) by mouth daily. 12/16/16   Chinita Pester, FNP  mometasone-formoterol (DULERA) 200-5 MCG/ACT AERO Inhale 2 puffs into the lungs 2 (two) times daily. 01/18/16   Wyatt Haste, MD  predniSONE (STERAPRED UNI-PAK 21 TAB) 10 MG (21) TBPK tablet Take 1 tablet (10 mg total) by mouth daily. 40mg  oral 1 day, then 20mg  oral for 2 days, then 10mg  oral 2 days, then stop 01/18/16   Wyatt Haste, MD  tiotropium (SPIRIVA) 18 MCG inhalation capsule Place 1 capsule (18 mcg total) into inhaler and inhale daily. 01/18/16   Wyatt Haste, MD    Allergies Patient has no known allergies.  Family History  Problem Relation Age of Onset  . Hypertension Mother     Social History Social History  Substance Use Topics  . Smoking status: Current Every Day Smoker    Packs/day:  1.00  . Smokeless tobacco: Not on file  . Alcohol use 0.6 oz/week    1 Standard drinks or equivalent per week    Review of Systems Constitutional: No recent illness. Cardiovascular: Denies chest pain or palpitations. Respiratory: Denies shortness of breath. Musculoskeletal: Pain in left upper leg. Skin: Negative for rash, wound, lesion. Neurological: Negative for focal weakness or numbness.  ____________________________________________   PHYSICAL EXAM:  VITAL SIGNS: ED Triage Vitals  Enc Vitals Group     BP 12/16/16 0823 (!) 152/93     Pulse Rate 12/16/16 0823 (!) 109     Resp 12/16/16 0823 18     Temp 12/16/16 0823 98.9 F (37.2 C)     Temp Source 12/16/16 0823 Oral     SpO2 12/16/16 0823 96 %     Weight 12/16/16 0823 120 lb (54.4 kg)     Height 12/16/16 0823 5\' 6"  (1.676 m)     Head Circumference --      Peak Flow --      Pain Score 12/16/16 0824 7     Pain Loc --      Pain Edu? --      Excl. in GC? --     Constitutional: Alert and oriented. Well appearing and in no acute distress. Eyes: Conjunctivae are normal. EOMI. Head: Atraumatic. Neck: No stridor.  Respiratory: Normal respiratory effort.   Musculoskeletal:  Full, unrestricted motion of the left hip and knee. No obvious injury or deformity. No focal tenderness of the lumbar spine or femur.  Neurologic:  Normal speech and language. No gross focal neurologic deficits are appreciated. Speech is normal. No gait instability. Skin:  Skin is warm, dry and intact. Atraumatic. Psychiatric: Mood and affect are normal. Speech and behavior are normal.  ____________________________________________   LABS (all labs ordered are listed, but only abnormal results are displayed)  Labs Reviewed - No data to display ____________________________________________  RADIOLOGY  Diffuse multi-level degenerative changes in the lumbar spine.  ____________________________________________   PROCEDURES  Procedure(s) performed:  None   ____________________________________________   INITIAL IMPRESSION / ASSESSMENT AND PLAN / ED COURSE  Patient was advised to follow up with his primary care provider or orthopedics for symptoms that are not improving over the week. He was given a prescription for meloxicam. He was advised to return to the ER for symptoms that change or worsen if unable to schedule an appointment.  Pertinent labs & imaging results that were available during my care of the patient were reviewed by me and considered in my medical decision making (see chart for details).  ____________________________________________   FINAL CLINICAL IMPRESSION(S) / ED DIAGNOSES  Final diagnoses:  Radicular pain of left lower extremity       Chinita PesterCari B Seleena Reimers, FNP 12/16/16 1326    Sharman CheekPhillip Stafford, MD 12/20/16 1110

## 2016-12-16 NOTE — ED Triage Notes (Signed)
Pt states having left leg pain throughout the day yesterday with shooting pains radiating through the left leg, pt states last night he attempted to shower and the left leg gave out, pt denies hx of problems with the left leg or back issues

## 2017-01-04 ENCOUNTER — Other Ambulatory Visit: Payer: Self-pay | Admitting: Orthopedic Surgery

## 2017-01-04 DIAGNOSIS — M47816 Spondylosis without myelopathy or radiculopathy, lumbar region: Secondary | ICD-10-CM

## 2017-01-04 DIAGNOSIS — M5416 Radiculopathy, lumbar region: Secondary | ICD-10-CM

## 2017-01-04 DIAGNOSIS — R29898 Other symptoms and signs involving the musculoskeletal system: Secondary | ICD-10-CM

## 2017-01-18 ENCOUNTER — Ambulatory Visit: Payer: BLUE CROSS/BLUE SHIELD

## 2019-12-04 ENCOUNTER — Emergency Department
Admission: EM | Admit: 2019-12-04 | Discharge: 2019-12-04 | Disposition: A | Discharge status: Home / Self Care | Payer: Self-pay | Attending: Emergency Medicine | Admitting: Emergency Medicine

## 2019-12-04 ENCOUNTER — Encounter: Payer: Self-pay | Admitting: Emergency Medicine

## 2019-12-04 ENCOUNTER — Other Ambulatory Visit: Payer: Self-pay

## 2019-12-04 ENCOUNTER — Emergency Department: Payer: Self-pay

## 2019-12-04 DIAGNOSIS — S39012A Strain of muscle, fascia and tendon of lower back, initial encounter: Secondary | ICD-10-CM

## 2019-12-04 DIAGNOSIS — K5900 Constipation, unspecified: Secondary | ICD-10-CM

## 2019-12-04 DIAGNOSIS — Y9389 Activity, other specified: Secondary | ICD-10-CM | POA: Insufficient documentation

## 2019-12-04 DIAGNOSIS — Y929 Unspecified place or not applicable: Secondary | ICD-10-CM | POA: Insufficient documentation

## 2019-12-04 DIAGNOSIS — X58XXXA Exposure to other specified factors, initial encounter: Secondary | ICD-10-CM | POA: Insufficient documentation

## 2019-12-04 DIAGNOSIS — J449 Chronic obstructive pulmonary disease, unspecified: Secondary | ICD-10-CM | POA: Insufficient documentation

## 2019-12-04 DIAGNOSIS — F17210 Nicotine dependence, cigarettes, uncomplicated: Secondary | ICD-10-CM | POA: Insufficient documentation

## 2019-12-04 DIAGNOSIS — S22080A Wedge compression fracture of T11-T12 vertebra, initial encounter for closed fracture: Secondary | ICD-10-CM

## 2019-12-04 DIAGNOSIS — Y999 Unspecified external cause status: Secondary | ICD-10-CM | POA: Insufficient documentation

## 2019-12-04 DIAGNOSIS — M5136 Other intervertebral disc degeneration, lumbar region: Secondary | ICD-10-CM | POA: Insufficient documentation

## 2019-12-04 HISTORY — DX: Unspecified osteoarthritis, unspecified site: M19.90

## 2019-12-04 LAB — URINALYSIS, COMPLETE (UACMP) WITH MICROSCOPIC
Bacteria, UA: NONE SEEN
Bilirubin Urine: NEGATIVE
Glucose, UA: NEGATIVE mg/dL
Hgb urine dipstick: NEGATIVE
Ketones, ur: 20 mg/dL — AB
Leukocytes,Ua: NEGATIVE
Nitrite: NEGATIVE
Protein, ur: 30 mg/dL — AB
Specific Gravity, Urine: 1.02 (ref 1.005–1.030)
Squamous Epithelial / LPF: NONE SEEN (ref 0–5)
pH: 6 (ref 5.0–8.0)

## 2019-12-04 MED ORDER — HYDROMORPHONE HCL 1 MG/ML IJ SOLN
0.5000 mg | Freq: Once | INTRAMUSCULAR | Status: AC
  Administered 2019-12-04: 13:00:00 0.5 mg via INTRAMUSCULAR
  Filled 2019-12-04: qty 1

## 2019-12-04 MED ORDER — TRAMADOL HCL 50 MG PO TABS: 50.0000 mg | ORAL_TABLET | Freq: Four times a day (QID) | ORAL | 0 refills | Status: DC | PRN

## 2019-12-04 MED ORDER — KETOROLAC TROMETHAMINE 30 MG/ML IJ SOLN
30.0000 mg | Freq: Once | INTRAMUSCULAR | Status: AC
  Administered 2019-12-04: 30 mg via INTRAMUSCULAR
  Filled 2019-12-04: qty 1

## 2019-12-04 MED ORDER — SENNOSIDES-DOCUSATE SODIUM 8.6-50 MG PO TABS: 2.0000 | ORAL_TABLET | Freq: Every day | ORAL | 0 refills | Status: DC | PRN

## 2019-12-04 MED ORDER — CYCLOBENZAPRINE HCL 10 MG PO TABS: 10.0000 mg | ORAL_TABLET | Freq: Three times a day (TID) | ORAL | 0 refills | Status: DC | PRN

## 2019-12-04 MED ORDER — ORPHENADRINE CITRATE 30 MG/ML IJ SOLN
30.0000 mg | Freq: Two times a day (BID) | INTRAMUSCULAR | Status: DC
  Administered 2019-12-04: 30 mg via INTRAMUSCULAR
  Filled 2019-12-04: qty 2

## 2019-12-04 NOTE — ED Triage Notes (Signed)
Pt in via [facility] from home, complaints of lower back pain x 3 days, denies any recent injury.  Per EMS pt ambulatory on scene with assistance.  NAD noted upon arrival.

## 2019-12-04 NOTE — ED Notes (Signed)
Pt states he has a walker at home.  

## 2019-12-04 NOTE — ED Notes (Signed)
Received call from son, Thor Nannini, states pt's other son is on the way; approximate ETA 45 minutes.    Pt updated.

## 2019-12-04 NOTE — ED Notes (Signed)
First nurse made aware of pt waiting in lobby

## 2019-12-04 NOTE — ED Notes (Signed)
Pt transported to Xray. 

## 2019-12-04 NOTE — ED Provider Notes (Signed)
[facility] Emergency Department Provider Note   ____________________________________________   First MD Initiated Contact with Patient 12/04/19 1300     (approximate)  I have reviewed the triage vital signs and the nursing notes.   HISTORY  Chief Complaint Back Pain    HPI Michael Padilla is a 64 y.o. male patient complain of 3 days of bilateral nontraumatic back pain.  Patient state requires assistance getting in and out of bed.  Patient denies radicular component to his back pain.  Patient has bladder bowel dysfunction.  Patient has history of degenerative disc disease lumbar spine.  Patient rates his pain as a 9/10.  Patient described pain as "achy".  No palliative measure for complaint.         Past Medical History:  Diagnosis Date  . Arthritis   . COPD (chronic obstructive pulmonary disease) [facility])     Patient Active Problem List   Diagnosis Date Noted  . SOB (shortness of breath) 01/16/2016    Past Surgical History:  Procedure Laterality Date  . none      Prior to Admission medications   Medication Sig Start Date End Date Taking? Authorizing Provider  cyclobenzaprine (FLEXERIL) 10 MG tablet Take 1 tablet (10 mg total) by mouth 3 (three) times daily as needed. 12/04/19   Sable Feil, PA-C  meloxicam (MOBIC) 15 MG tablet Take 1 tablet (15 mg total) by mouth daily. 12/16/16   Triplett, Cari B, FNP  mometasone-formoterol (DULERA) 200-5 MCG/ACT AERO Inhale 2 puffs into the lungs 2 (two) times daily. 01/18/16   Hower, Aaron Mose, MD  predniSONE (STERAPRED UNI-PAK 21 TAB) 10 MG (21) TBPK tablet Take 1 tablet (10 mg total) by mouth daily. 40mg  oral 1 day, then 20mg  oral for 2 days, then 10mg  oral 2 days, then stop 01/18/16   Hower, Aaron Mose, MD  senna-docusate (SENOKOT-S) 8.6-50 MG tablet Take 2 tablets by mouth daily as needed for mild constipation. 12/04/19 12/03/20  Sable Feil, PA-C  tiotropium (SPIRIVA) 18 MCG inhalation capsule Place 1  capsule (18 mcg total) into inhaler and inhale daily. 01/18/16   Hower, Aaron Mose, MD  traMADol (ULTRAM) 50 MG tablet Take 1 tablet (50 mg total) by mouth every 6 (six) hours as needed for moderate pain. 12/04/19   Sable Feil, PA-C    Allergies Patient has no known allergies.  Family History  Problem Relation Age of Onset  . Hypertension Mother     Social History Social History   Tobacco Use  . Smoking status: Current Every Day Smoker    Packs/day: 1.00    Types: Cigarettes  Substance Use Topics  . Alcohol use: Yes    Alcohol/week: 1.0 standard drinks    Types: 1 Standard drinks or equivalent per week  . Drug use: No    Review of Systems Constitutional: No fever/chills Eyes: No visual changes. ENT: No sore throat. Cardiovascular: Denies chest pain. Respiratory: Denies shortness of breath. Gastrointestinal: No abdominal pain.  No nausea, no vomiting.  No diarrhea.  No constipation. Genitourinary: Negative for dysuria. Musculoskeletal: Positive for back pain. Skin: Negative for rash. Neurological: Negative for headaches, focal weakness or numbness.   ____________________________________________   PHYSICAL EXAM:  VITAL SIGNS: ED Triage Vitals  Enc Vitals Group     BP 12/04/19 1303 135/89     Pulse Rate 12/04/19 1303 (!) 116     Resp 12/04/19 1303 20     Temp 12/04/19 1303 99.7 F (37.6 C)  Temp Source 12/04/19 1303 Oral     SpO2 12/04/19 1303 93 %     Weight --      Height --      Head Circumference --      Peak Flow --      Pain Score 12/04/19 1304 9     Pain Loc --      Pain Edu? --      Excl. in GC? --     Constitutional: Alert and oriented. Well appearing and in no acute distress. Neck: No cervical spine tenderness to palpation. Hematological/Lymphatic/Immunilogical: No cervical lymphadenopathy. Cardiovascular: Normal rate, regular rhythm. Grossly normal heart sounds.  Good peripheral circulation. Respiratory: Normal respiratory effort.  No  retractions. Lungs CTAB. Gastrointestinal: Soft and nontender. No distention. No abdominal bruits. No CVA tenderness. Musculoskeletal: No lower extremity tenderness nor edema.  No joint effusions.  Patient's decreased range of motion is all fields.  Patient ambulates with assistance. Neurologic:  Normal speech and language. No gross focal neurologic deficits are appreciated. No gait instability. Skin:  Skin is warm, dry and intact. No rash noted. Psychiatric: Mood and affect are normal. Speech and behavior are normal.  ____________________________________________   LABS (all labs ordered are listed, but only abnormal results are displayed)  Labs Reviewed  URINALYSIS, COMPLETE (UACMP) WITH MICROSCOPIC - Abnormal; Notable for the following components:      Result Value   Color, Urine YELLOW (*)    APPearance CLEAR (*)    Ketones, ur 20 (*)    Protein, ur 30 (*)    All other components within normal limits   ____________________________________________  EKG   ____________________________________________  RADIOLOGY  ED MD interpretation:    Official radiology report(s): DG Lumbar Spine 2-3 Views  Result Date: 12/04/2019 CLINICAL DATA:  Progressive lumbosacral back pain for 3 days. No radicular component. No recent injury. EXAM: LUMBAR SPINE - 2-3 VIEW COMPARISON:  Radiograph 12/16/2016 FINDINGS: Mild T12 superior endplate compression fracture, new from 2018 but age indeterminate. Lumbar vertebral body heights are preserved. The alignment is maintained. Disc space narrowing at L4-L5 and L5-S1 with endplate spurring. Lower lumbar facet hypertrophy. Posterior elements appear intact. Sacroiliac joints are congruent. Aortic atherosclerosis. IMPRESSION: 1. Mild T12 superior endplate compression fracture, new from 2018 but age indeterminate. 2. Degenerative disc disease and facet hypertrophy, most prominent at L4-L5 and L5-S1. This is not significantly changed from 2018. Electronically Signed    By: Narda Rutherford M.D.   On: 12/04/2019 14:17    ____________________________________________   PROCEDURES  Procedure(s) performed (including Critical Care):  Procedures   ____________________________________________   INITIAL IMPRESSION / ASSESSMENT AND PLAN / ED COURSE  As part of my medical decision making, I reviewed the following data within the electronic MEDICAL RECORD NUMBER     Patient presents with 3 days of back pain.  Patient denies injury.  Patient ambulates with assistance of walker.  Discussed x-ray findings with patient was remarkable only for mild compression fracture T12 and degenerative changes of the lumbar spine.  It was noted that the patient had moderate stool burden.  Discussed lab results with patient and advised to increase fluid intake.  Patient given discharge care instruction advised to establish care for PCP.    Terel Kandis Ban was evaluated in Emergency Department on 12/04/2019 for the symptoms described in the history of present illness. He was evaluated in the context of the global COVID-19 pandemic, which necessitated consideration that the patient might be at risk for infection with  the SARS-CoV-2 virus that causes COVID-19. Institutional protocols and algorithms that pertain to the evaluation of patients at risk for COVID-19 are in a state of rapid change based on information released by regulatory bodies including the CDC and federal and state organizations. These policies and algorithms were followed during the patient's care in the ED.       ____________________________________________   FINAL CLINICAL IMPRESSION(S) / ED DIAGNOSES  Final diagnoses:  Strain of lumbar region, initial encounter  Compression fracture of T12 vertebra, initial encounter (HCC)  Constipation in male     ED Discharge Orders         Ordered    traMADol (ULTRAM) 50 MG tablet  Every 6 hours PRN     12/04/19 1507    cyclobenzaprine (FLEXERIL) 10 MG tablet  3  times daily PRN     12/04/19 1507    senna-docusate (SENOKOT-S) 8.6-50 MG tablet  Daily PRN     12/04/19 1507           Note:  This document was prepared using Dragon voice recognition software and may include unintentional dictation errors.    Joni Reining, PA-C 12/04/19 1514    Sharman Cheek, MD 12/04/19 [phone]

## 2019-12-04 NOTE — Discharge Instructions (Signed)
Follow discharge care instructions.  Advised to ambulate with assistance of walker.  Take medication as needed for pain.  Advised establish care for PCP.

## 2019-12-04 NOTE — ED Notes (Signed)
Pt ambulatory with walker to and from bathroom, approximately 100 feet.  Pt stable with walker.  PA, [employer].

## 2020-05-05 ENCOUNTER — Telehealth: Payer: Self-pay | Admitting: General Practice

## 2020-05-05 NOTE — Telephone Encounter (Signed)
Individual has been contacted 3+ times regarding ED referral. No further attempts to contact individual will be made. 

## 2020-11-17 ENCOUNTER — Other Ambulatory Visit: Payer: Self-pay

## 2020-11-17 ENCOUNTER — Inpatient Hospital Stay
Admission: EM | Admit: 2020-11-17 | Discharge: 2020-11-25 | DRG: 177 | Disposition: A | Discharge status: Home / Self Care | Payer: Medicare HMO | Attending: Internal Medicine | Admitting: Internal Medicine

## 2020-11-17 ENCOUNTER — Encounter: Payer: Self-pay | Admitting: *Deleted

## 2020-11-17 ENCOUNTER — Emergency Department: Payer: Medicare HMO

## 2020-11-17 DIAGNOSIS — Z791 Long term (current) use of non-steroidal anti-inflammatories (NSAID): Secondary | ICD-10-CM | POA: Diagnosis not present

## 2020-11-17 DIAGNOSIS — Z7951 Long term (current) use of inhaled steroids: Secondary | ICD-10-CM

## 2020-11-17 DIAGNOSIS — J9601 Acute respiratory failure with hypoxia: Secondary | ICD-10-CM | POA: Diagnosis not present

## 2020-11-17 DIAGNOSIS — Z8249 Family history of ischemic heart disease and other diseases of the circulatory system: Secondary | ICD-10-CM

## 2020-11-17 DIAGNOSIS — Z79899 Other long term (current) drug therapy: Secondary | ICD-10-CM | POA: Diagnosis not present

## 2020-11-17 DIAGNOSIS — E876 Hypokalemia: Secondary | ICD-10-CM | POA: Diagnosis present

## 2020-11-17 DIAGNOSIS — E440 Moderate protein-calorie malnutrition: Secondary | ICD-10-CM | POA: Diagnosis present

## 2020-11-17 DIAGNOSIS — E871 Hypo-osmolality and hyponatremia: Secondary | ICD-10-CM | POA: Diagnosis present

## 2020-11-17 DIAGNOSIS — J441 Chronic obstructive pulmonary disease with (acute) exacerbation: Secondary | ICD-10-CM | POA: Diagnosis not present

## 2020-11-17 DIAGNOSIS — U071 COVID-19: Secondary | ICD-10-CM | POA: Diagnosis not present

## 2020-11-17 DIAGNOSIS — J449 Chronic obstructive pulmonary disease, unspecified: Secondary | ICD-10-CM | POA: Diagnosis not present

## 2020-11-17 DIAGNOSIS — W19XXXA Unspecified fall, initial encounter: Secondary | ICD-10-CM | POA: Diagnosis not present

## 2020-11-17 DIAGNOSIS — R531 Weakness: Secondary | ICD-10-CM | POA: Diagnosis not present

## 2020-11-17 DIAGNOSIS — B342 Coronavirus infection, unspecified: Secondary | ICD-10-CM | POA: Diagnosis not present

## 2020-11-17 DIAGNOSIS — Z6821 Body mass index (BMI) 21.0-21.9, adult: Secondary | ICD-10-CM | POA: Diagnosis not present

## 2020-11-17 DIAGNOSIS — E860 Dehydration: Secondary | ICD-10-CM | POA: Diagnosis present

## 2020-11-17 DIAGNOSIS — J440 Chronic obstructive pulmonary disease with acute lower respiratory infection: Secondary | ICD-10-CM | POA: Diagnosis present

## 2020-11-17 DIAGNOSIS — E861 Hypovolemia: Secondary | ICD-10-CM | POA: Diagnosis present

## 2020-11-17 DIAGNOSIS — J9690 Respiratory failure, unspecified, unspecified whether with hypoxia or hypercapnia: Secondary | ICD-10-CM | POA: Diagnosis not present

## 2020-11-17 DIAGNOSIS — F17210 Nicotine dependence, cigarettes, uncomplicated: Secondary | ICD-10-CM | POA: Diagnosis present

## 2020-11-17 DIAGNOSIS — J1282 Pneumonia due to coronavirus disease 2019: Secondary | ICD-10-CM | POA: Diagnosis present

## 2020-11-17 DIAGNOSIS — J984 Other disorders of lung: Secondary | ICD-10-CM | POA: Diagnosis present

## 2020-11-17 DIAGNOSIS — A0839 Other viral enteritis: Secondary | ICD-10-CM | POA: Diagnosis present

## 2020-11-17 DIAGNOSIS — E222 Syndrome of inappropriate secretion of antidiuretic hormone: Secondary | ICD-10-CM | POA: Diagnosis not present

## 2020-11-17 DIAGNOSIS — K529 Noninfective gastroenteritis and colitis, unspecified: Secondary | ICD-10-CM | POA: Diagnosis not present

## 2020-11-17 DIAGNOSIS — R0602 Shortness of breath: Secondary | ICD-10-CM | POA: Diagnosis not present

## 2020-11-17 DIAGNOSIS — J189 Pneumonia, unspecified organism: Secondary | ICD-10-CM | POA: Diagnosis not present

## 2020-11-17 DIAGNOSIS — R000 Tachycardia, unspecified: Secondary | ICD-10-CM | POA: Diagnosis not present

## 2020-11-17 DIAGNOSIS — J439 Emphysema, unspecified: Secondary | ICD-10-CM | POA: Diagnosis not present

## 2020-11-17 DIAGNOSIS — J9621 Acute and chronic respiratory failure with hypoxia: Secondary | ICD-10-CM | POA: Diagnosis not present

## 2020-11-17 DIAGNOSIS — Y92009 Unspecified place in unspecified non-institutional (private) residence as the place of occurrence of the external cause: Secondary | ICD-10-CM

## 2020-11-17 LAB — COMPREHENSIVE METABOLIC PANEL
ALT: 34 U/L (ref 0–44)
AST: 62 U/L — ABNORMAL HIGH (ref 15–41)
Albumin: 3.3 g/dL — ABNORMAL LOW (ref 3.5–5.0)
Alkaline Phosphatase: 65 U/L (ref 38–126)
Anion gap: 15 (ref 5–15)
BUN: 7 mg/dL — ABNORMAL LOW (ref 8–23)
CO2: 19 mmol/L — ABNORMAL LOW (ref 22–32)
Calcium: 8 mg/dL — ABNORMAL LOW (ref 8.9–10.3)
Chloride: 83 mmol/L — ABNORMAL LOW (ref 98–111)
Creatinine, Ser: 0.57 mg/dL — ABNORMAL LOW (ref 0.61–1.24)
GFR, Estimated: >60 mL/min (ref >60)
Glucose, Bld: 129 mg/dL — ABNORMAL HIGH (ref 70–99)
Potassium: 3.5 mmol/L (ref 3.5–5.1)
Sodium: 117 mmol/L — CL (ref 135–145)
Total Bilirubin: 1.2 mg/dL (ref 0.3–1.2)
Total Protein: 6.8 g/dL (ref 6.5–8.1)

## 2020-11-17 LAB — LACTIC ACID, PLASMA: Lactic Acid, Venous: 2.3 mmol/L (ref 0.5–1.9)

## 2020-11-17 LAB — CBC WITH DIFFERENTIAL/PLATELET
Abs Immature Granulocytes: 0.07 10*3/uL (ref 0.00–0.07)
Basophils Absolute: 0 10*3/uL (ref 0.0–0.1)
Basophils Relative: 0 %
Eosinophils Absolute: 0 10*3/uL (ref 0.0–0.5)
Eosinophils Relative: 0 %
HCT: 46.4 % (ref 39.0–52.0)
Hemoglobin: 17.4 g/dL — ABNORMAL HIGH (ref 13.0–17.0)
Immature Granulocytes: 1 %
Lymphocytes Relative: 5 %
Lymphs Abs: 0.3 10*3/uL — ABNORMAL LOW (ref 0.7–4.0)
MCH: 32.8 pg (ref 26.0–34.0)
MCHC: 37.5 g/dL — ABNORMAL HIGH (ref 30.0–36.0)
MCV: 87.5 fL (ref 80.0–100.0)
Monocytes Absolute: 1 10*3/uL (ref 0.1–1.0)
Monocytes Relative: 15 %
Neutro Abs: 5.1 10*3/uL (ref 1.7–7.7)
Neutrophils Relative %: 79 %
Platelets: 361 10*3/uL (ref 150–400)
RBC: 5.3 MIL/uL (ref 4.22–5.81)
RDW: 12.9 % (ref 11.5–15.5)
WBC: 6.5 10*3/uL (ref 4.0–10.5)
nRBC: 0 % (ref 0.0–0.2)

## 2020-11-17 LAB — PROTIME-INR
INR: 1 (ref 0.8–1.2)
Prothrombin Time: 13 seconds (ref 11.4–15.2)

## 2020-11-17 LAB — OSMOLALITY: Osmolality: 245 mOsm/kg — CL (ref 275–295)

## 2020-11-17 LAB — POC SARS CORONAVIRUS 2 AG -  ED: SARS Coronavirus 2 Ag: POSITIVE — AB

## 2020-11-17 LAB — APTT: aPTT: 41 seconds — ABNORMAL HIGH (ref 24–36)

## 2020-11-17 LAB — TROPONIN I (HIGH SENSITIVITY): Troponin I (High Sensitivity): 11 ng/L (ref <18)

## 2020-11-17 MED ORDER — ALBUTEROL SULFATE HFA 108 (90 BASE) MCG/ACT IN AERS
2.0000 | INHALATION_SPRAY | RESPIRATORY_TRACT | Status: DC | PRN
  Administered 2020-11-18 – 2020-11-21 (×2): 2 via RESPIRATORY_TRACT
  Filled 2020-11-17: qty 6.7

## 2020-11-17 MED ORDER — GUAIFENESIN-DM 100-10 MG/5ML PO SYRP: 10.0000 mL | ORAL_SOLUTION | ORAL | Status: DC | PRN

## 2020-11-17 MED ORDER — METHYLPREDNISOLONE SODIUM SUCC 125 MG IJ SOLR
125.0000 mg | INTRAMUSCULAR | Status: AC
  Administered 2020-11-17: 125 mg via INTRAVENOUS
  Filled 2020-11-17: qty 2

## 2020-11-17 MED ORDER — HYDROCOD POLST-CPM POLST ER 10-8 MG/5ML PO SUER: 5.0000 mL | Freq: Two times a day (BID) | ORAL | Status: DC | PRN

## 2020-11-17 MED ORDER — SODIUM CHLORIDE 0.9 % IV SOLN
100.0000 mg | Freq: Every day | INTRAVENOUS | Status: AC
  Administered 2020-11-18 – 2020-11-21 (×4): 100 mg via INTRAVENOUS
  Filled 2020-11-17 (×4): qty 20

## 2020-11-17 MED ORDER — ACETAMINOPHEN 325 MG PO TABS: 650.0000 mg | ORAL_TABLET | Freq: Four times a day (QID) | ORAL | Status: DC | PRN

## 2020-11-17 MED ORDER — ENOXAPARIN SODIUM 40 MG/0.4ML ~~LOC~~ SOLN
40.0000 mg | SUBCUTANEOUS | Status: DC
  Administered 2020-11-17 – 2020-11-24 (×8): 40 mg via SUBCUTANEOUS
  Filled 2020-11-17 (×8): qty 0.4

## 2020-11-17 MED ORDER — IPRATROPIUM-ALBUTEROL 0.5-2.5 (3) MG/3ML IN SOLN: 3.0000 mL | Freq: Once | RESPIRATORY_TRACT | Status: DC

## 2020-11-17 MED ORDER — ONDANSETRON HCL 4 MG/2ML IJ SOLN
4.0000 mg | Freq: Four times a day (QID) | INTRAMUSCULAR | Status: DC | PRN
  Administered 2020-11-24: 4 mg via INTRAVENOUS
  Filled 2020-11-17: qty 2

## 2020-11-17 MED ORDER — METHYLPREDNISOLONE SODIUM SUCC 40 MG IJ SOLR
40.0000 mg | Freq: Two times a day (BID) | INTRAMUSCULAR | Status: DC
  Administered 2020-11-18 – 2020-11-23 (×11): 40 mg via INTRAVENOUS
  Filled 2020-11-17 (×11): qty 1

## 2020-11-17 MED ORDER — SODIUM CHLORIDE 0.9 % IV SOLN: INTRAVENOUS | Status: AC

## 2020-11-17 MED ORDER — NICOTINE 21 MG/24HR TD PT24
21.0000 mg | MEDICATED_PATCH | Freq: Every day | TRANSDERMAL | Status: DC
  Administered 2020-11-17 – 2020-11-25 (×9): 21 mg via TRANSDERMAL
  Filled 2020-11-17 (×9): qty 1

## 2020-11-17 MED ORDER — LACTATED RINGERS IV BOLUS
1000.0000 mL | Freq: Once | INTRAVENOUS | Status: AC
  Administered 2020-11-17: 1000 mL via INTRAVENOUS

## 2020-11-17 MED ORDER — ONDANSETRON HCL 4 MG PO TABS: 4.0000 mg | ORAL_TABLET | Freq: Four times a day (QID) | ORAL | Status: DC | PRN

## 2020-11-17 MED ORDER — IPRATROPIUM-ALBUTEROL 20-100 MCG/ACT IN AERS
1.0000 | INHALATION_SPRAY | Freq: Four times a day (QID) | RESPIRATORY_TRACT | Status: DC
  Administered 2020-11-17 – 2020-11-25 (×31): 1 via RESPIRATORY_TRACT
  Filled 2020-11-17: qty 4

## 2020-11-17 MED ORDER — ZINC SULFATE 220 (50 ZN) MG PO CAPS
220.0000 mg | ORAL_CAPSULE | Freq: Every day | ORAL | Status: DC
  Administered 2020-11-18 – 2020-11-25 (×8): 220 mg via ORAL
  Filled 2020-11-17 (×8): qty 1

## 2020-11-17 MED ORDER — ASCORBIC ACID 500 MG PO TABS
500.0000 mg | ORAL_TABLET | Freq: Every day | ORAL | Status: DC
  Administered 2020-11-18 – 2020-11-25 (×8): 500 mg via ORAL
  Filled 2020-11-17 (×8): qty 1

## 2020-11-17 MED ORDER — SODIUM CHLORIDE 0.9 % IV SOLN
200.0000 mg | Freq: Once | INTRAVENOUS | Status: AC
  Administered 2020-11-18: 200 mg via INTRAVENOUS
  Filled 2020-11-17: qty 200

## 2020-11-17 NOTE — Progress Notes (Signed)
Remdesivir - Pharmacy Brief Note   O:  CXR: "Hyperinflation with emphysematous disease and chronic bronchitic changes. Vague left lung base opacity may reflect acute superimposed mild pneumonia." SpO2: 72-95% on RA   A/P:  Remdesivir 200 mg IVPB once followed by 100 mg IVPB daily x 4 days.   Otelia Sergeant, PharmD, MBA 11/17/2020 10:20 PM

## 2020-11-17 NOTE — ED Triage Notes (Addendum)
Pt tio triage via wheelchair.  Son states pt was in the floor today for approx 8 hours while he was at work . Pt reports weakness and that his legs gave out.  Pt has sob.  No chest pain.   Hx copd.  Pt has a cough.  cig smoker 1ppd.  Pt alert  Speech clear.  Pt has had diarrhea for 4-5 days, drinks 40 oz each day of etoh.

## 2020-11-17 NOTE — ED Notes (Signed)
Date and time results received: 11/17/20 2053 (use smartphrase ".now" to insert current time)  Test: sodium Critical Value: 117  Name of Provider Notified: Fanny Bien, MD  Orders Received? Or Actions Taken?: Orders Received - See Orders for details

## 2020-11-17 NOTE — ED Provider Notes (Signed)
[facility] Emergency Department Provider Note   ____________________________________________   Event Date/Time   First MD Initiated Contact with Patient 11/17/20 1936     (approximate)  I have reviewed the triage vital signs and the nursing notes.   HISTORY  Chief Complaint Weakness    HPI Michael Padilla is a 65 y.o. male with a history of COPD  Patient has had 4 to 5 days of loose watery stools.  Denies any pain with it no abdominal pain no black or bloody stools.  He reports he has been having frequent diarrhea for 4 to 5 days.  He is also had a little bit of shortness of breath because he spent 8 hours "on the floor today"  He got up this morning and was so weak that he felt weak in both legs, he ended up having to go to the ground and laid on the ground unable to get himself up for 8 hours.  Since then he developed a slight feeling of shortness of breath and some wheezing but reports he usually use an inhaler treatment at home.  Additionally, has had very loose watery nonbloody stools.  No chest pain or trouble.  No fevers.  No evidence of recent antibiotic use   Past Medical History:  Diagnosis Date  . Arthritis   . COPD (chronic obstructive pulmonary disease) [facility])     Patient Active Problem List   Diagnosis Date Noted  . Gastroenteritis due to COVID-19 virus 11/17/2020  . Pneumonia due to COVID-19 virus 11/17/2020  . Hyponatremia 11/17/2020  . Generalized weakness 11/17/2020  . COPD with acute exacerbation (HCC) 11/17/2020  . SOB (shortness of breath) 01/16/2016    Past Surgical History:  Procedure Laterality Date  . none      Prior to Admission medications   Medication Sig Start Date End Date Taking? Authorizing Provider  cyclobenzaprine (FLEXERIL) 10 MG tablet Take 1 tablet (10 mg total) by mouth 3 (three) times daily as needed. 12/04/19   Joni Reining, PA-C  meloxicam (MOBIC) 15 MG tablet Take 1 tablet (15 mg total) by mouth  daily. 12/16/16   Triplett, Cari B, FNP  mometasone-formoterol (DULERA) 200-5 MCG/ACT AERO Inhale 2 puffs into the lungs 2 (two) times daily. 01/18/16   Hower, Cletis [place], MD  predniSONE (STERAPRED UNI-PAK 21 TAB) 10 MG (21) TBPK tablet Take 1 tablet (10 mg total) by mouth daily. 40mg  oral 1 day, then 20mg  oral for 2 days, then 10mg  oral 2 days, then stop 01/18/16   Hower, , MD  senna-docusate (SENOKOT-S) 8.6-50 MG tablet Take 2 tablets by mouth daily as needed for mild constipation. 12/04/19 12/03/20  01-30-1979, PA-C  tiotropium (SPIRIVA) 18 MCG inhalation capsule Place 1 capsule (18 mcg total) into inhaler and inhale daily. 01/18/16   Hower, 01/31/21, MD  traMADol (ULTRAM) 50 MG tablet Take 1 tablet (50 mg total) by mouth every 6 (six) hours as needed for moderate pain. 12/04/19   01/20/16, PA-C    Allergies Patient has no known allergies.  Family History  Problem Relation Age of Onset  . Hypertension Mother     Social History Social History   Tobacco Use  . Smoking status: Current Every Day Smoker    Packs/day: 1.00    Types: Cigarettes  . Smokeless tobacco: Never Used  Substance Use Topics  . Alcohol use: Yes    Alcohol/week: 1.0 standard drink    Types: 1 Standard drinks  or equivalent per week  . Drug use: No    Review of Systems Constitutional: No fever/chills but feeling progressively more weak over the last few days Denies any injury or falling today, but reports he got up and fell just so weak he ended up lowering himself to the floor and couldn't get up Eyes: No visual changes. ENT: No sore throat. Cardiovascular: Denies chest pain. Respiratory: Slight shortness of breath reports from his COPD.  No cough. Gastrointestinal: No abdominal pain.  See HPI Genitourinary: Negative for dysuria. Musculoskeletal: Negative for back pain. Skin: Negative for rash.  Reports that the skin on his nose and upper chest and back of his ears his eyes had a bit of a purplish  color to it and its been normal for him. Neurological: Negative for headaches, areas of focal weakness or numbness.    ____________________________________________   PHYSICAL EXAM:  VITAL SIGNS: ED Triage Vitals  Enc Vitals Group     BP 11/17/20 1922 (!) 92/57     Pulse Rate 11/17/20 1922 72     Resp 11/17/20 1922 20     Temp 11/17/20 1922 97.8 F (36.6 C)     Temp Source 11/17/20 1922 Oral     SpO2 11/17/20 1922 91 %     Weight 11/17/20 1923 140 lb (63.5 kg)     Height 11/17/20 1923 5\' 8"  (1.727 m)     Head Circumference --      Peak Flow --      Pain Score --      Pain Loc --      Pain Edu? --      Excl. in GC? --     Constitutional: Alert and oriented.  He is ill-appearing, appears very fatigued somewhat pale in complexion and potentially a bit cyanotic in his nailbeds.  He is not in acute distress or extremitas but appears ill.  No respiratory distress Eyes: Conjunctivae are normal. Head: Atraumatic. Nose: No congestion/rhinnorhea. Mouth/Throat: Mucous membranes are very dry. Neck: No stridor.  Cardiovascular: Moderately tachycardic rate, regular rhythm. Grossly normal heart sounds.  Good peripheral circulation. Respiratory: Normal respiratory effort.  No retractions.  Patient has mild end expiratory wheezing throughout all lobes.  He speaks in full clear sentences however.  Does not show signs of respiratory distress Gastrointestinal: Soft and nontender. No distention.  He appears somewhat cachectic Musculoskeletal: No lower extremity tenderness nor edema. Neurologic:  Normal speech and language. No gross focal neurologic deficits are appreciated.  Skin:  Skin is cool, slow perfusion in the nailbeds. No rash noted. Psychiatric: Mood and affect are normal. Speech and behavior are normal.  ____________________________________________   LABS (all labs ordered are listed, but only abnormal results are displayed)  Labs Reviewed  LACTIC ACID, PLASMA - Abnormal;  Notable for the following components:      Result Value   Lactic Acid, Venous 2.3 (*)    All other components within normal limits  COMPREHENSIVE METABOLIC PANEL - Abnormal; Notable for the following components:   Sodium 117 (*)    Chloride 83 (*)    CO2 19 (*)    Glucose, Bld 129 (*)    BUN 7 (*)    Creatinine, Ser 0.57 (*)    Calcium 8.0 (*)    Albumin 3.3 (*)    AST 62 (*)    All other components within normal limits  CBC WITH DIFFERENTIAL/PLATELET - Abnormal; Notable for the following components:   Hemoglobin 17.4 (*)  MCHC 37.5 (*)    Lymphs Abs 0.3 (*)    All other components within normal limits  APTT - Abnormal; Notable for the following components:   aPTT 41 (*)    All other components within normal limits  POC SARS CORONAVIRUS 2 AG -  ED - Abnormal; Notable for the following components:   SARS Coronavirus 2 Ag Positive (*)    All other components within normal limits  CULTURE, BLOOD (SINGLE)  PROTIME-INR  URINALYSIS, COMPLETE (UACMP) WITH MICROSCOPIC  BASIC METABOLIC PANEL  CK  PROCALCITONIN  HIV ANTIBODY (ROUTINE TESTING W REFLEX)  CBC WITH DIFFERENTIAL/PLATELET  COMPREHENSIVE METABOLIC PANEL  C-REACTIVE PROTEIN  FIBRIN DERIVATIVES D-DIMER ([facility] ONLY)  FERRITIN  MAGNESIUM  PHOSPHORUS  OSMOLALITY, URINE  OSMOLALITY  TROPONIN I (HIGH SENSITIVITY)   ____________________________________________  EKG  Reviewed inter by me at 1955 Heart rate 130 QRS 120 QTc 500 Sinus tachycardia, frequent PACs.  Somnolent difficult baseline, but overall appears sinus tachycardia without obvious evidence of ischemia.  Nonspecific T wave abnormality.  EKG also performed at 1920 reviewed interpreted as same except heart rate higher at 140 ____________________________________________  RADIOLOGY  DG Chest Port 1 View  Result Date: 11/17/2020 CLINICAL DATA:  Weakness EXAM: PORTABLE CHEST 1 VIEW COMPARISON:  01/16/2016 FINDINGS: Hyperinflation with emphysematous disease and  chronic bronchitic changes. Vague left lung base opacity may reflect acute superimposed mild pneumonia. Normal cardiomediastinal silhouette with aortic atherosclerosis. No pneumothorax IMPRESSION: Hyperinflation with emphysematous disease and chronic bronchitic changes. Vague left lung base opacity may reflect acute superimposed mild pneumonia. Electronically Signed   By: Jasmine PangKim  Fujinaga M.D.   On: 11/17/2020 19:58    Imaging reviewed, possible mild lung opacity of lung base on left ____________________________________________   PROCEDURES  Procedure(s) performed: None  Procedures  Critical Care performed: Yes, see critical care note(s)  CRITICAL CARE Performed by: Sharyn CreamerMark Quale   Total critical care time: 30 minutes  Critical care time was exclusive of separately billable procedures and treating other patients.  Critical care was necessary to treat or prevent imminent or life-threatening deterioration.  Critical care was time spent personally by me on the following activities: development of treatment plan with patient and/or surrogate as well as nursing, discussions with consultants, evaluation of patient's response to treatment, examination of patient, obtaining history from patient or surrogate, ordering and performing treatments and interventions, ordering and review of laboratory studies, ordering and review of radiographic studies, pulse oximetry and re-evaluation of patient's condition.  ____________________________________________   INITIAL IMPRESSION / ASSESSMENT AND PLAN / ED COURSE  Pertinent labs & imaging results that were available during my care of the patient were reviewed by me and considered in my medical decision making (see chart for details).     Clinical Course as of 11/17/20 2314  Tue Nov 17, 2020  1941 Just seen evaluated by me.  He does appear somewhat poorly perfused in his nailbeds with slow capillary refill.  Will order fluid bolus and sepsis panel [MQ]   2017 Discussed COVID-19 positive test result with patient.  His son who is also at the bedside received this information.  They do live together and has been helping his father for the last several days.  Patient himself is not vaccinated but is amenable to receiving Covid specific treatments as needed.  I certainly anticipate admission to hospital awaiting return of additional lab testing at this time.  Patient and son understand agreeable with this plan.  I did also discussed with the patient's son  and notified him that COVID-19 extremely contagious and signs and symptoms to watch for, patient son reports they are in close contact and he is himself not experiencing symptoms at this time [MQ]  2055 Patient's labs resulted with significant hyponatremia.  Will hold on further fluid resuscitation at this time as patient has not received 1 L of fluid, do not wish to rapidly correct sodium at this time.  Patient remains mildly hypotensive but does feel improved.  He and his son understand plan for admission. [MQ]  2231 Repeat chemistry is pending, was delayed as the patient had 2 previous samples clotted [MQ]    Clinical Course User Index [MQ] Sharyn Creamer, MD   Patient appears to have symptoms consistent with COVID-19 gastroenteritis with associated mild pulmonary infiltrate potentially.  He appears dehydrated with significant hyponatremia.  Discussed case and care with the hospitalist Dr. Allena Katz.  Patient will be admitted for further care and management.  Repeat chemistry pending  ____________________________________________   FINAL CLINICAL IMPRESSION(S) / ED DIAGNOSES  Final diagnoses:  Weakness  Dehydration  Gastroenteritis due to COVID-19 virus  COPD with acute exacerbation (HCC)        Note:  This document was prepared using Dragon voice recognition software and may include unintentional dictation errors       Sharyn Creamer, MD 11/17/20 2315

## 2020-11-17 NOTE — H&P (Signed)
History and Physical    Michael Padilla JYN:[id number] DOB: 01-Apr-1956 DOA: 11/17/2020  PCP: Patient, No Pcp Per  Patient coming from: Home  I have personally briefly reviewed patient's old medical records in [place]  Chief Complaint: Weakness, diarrhea  HPI: Michael Padilla is a 65 y.o. male with medical history significant for COPD and tobacco use who presents to the ED for evaluation of generalized weakness and diarrhea.  Patient states he has been having almost 1 week of frequent watery nonbloody diarrhea.  He has become progressively weak.  He has had associated lightheadedness/dizziness.  Today he fell at home due to his weakness and was unable to get up on his own.  He says he laid on the floor for almost 8 hours.  He has a chronic cough productive of white sputum which he says is unchanged from baseline.  He says he has not really felt short of breath until earlier today while laying on the ground.  He denies any subjective fevers, chills, diaphoresis, nausea, vomiting, abdominal pain, chest pain, dysuria.  Patient states he is not currently taking any medications.  ED Course:  Initial vitals showed BP 92/57, pulse 128, RR 20, temp 97.8 F, SPO2 91% on room air.  Labs significant for sodium 117, potassium 3.5, chloride 83, bicarb 19, BUN 7, creatinine 0.57, serum glucose 129, AST 62, ALT 34, alk phos 65, total bilirubin 1.2, WBC 6.5, hemoglobin 17.4, platelets 361,000, lactic acid 2.3, high-sensitivity troponin I 11.  CK pending.  Blood cultures obtained and pending.  POC SARS-CoV-2 antigen test is positive.  Portable chest x-ray shows hyperinflated lung fields with emphysematous disease and chronic bronchitic changes.  Left lung base opacity noted.  Patient was given 1 L LR and IV Solu-Medrol 125 mg.  The hospitalist service was consulted to admit for further evaluation and management.  Review of Systems: All systems reviewed and are negative except as documented in  history of present illness above.   Past Medical History:  Diagnosis Date  . Arthritis   . COPD (chronic obstructive pulmonary disease) ([place])     Past Surgical History:  Procedure Laterality Date  . none      Social History:  reports that he has been smoking cigarettes. He has been smoking about 1.00 pack per day. He has never used smokeless tobacco. He reports current alcohol use of about 1.0 standard drink of alcohol per week. He reports that he does not use drugs.  No Known Allergies  Family History  Problem Relation Age of Onset  . Hypertension Mother      Prior to Admission medications   Medication Sig Start Date End Date Taking? Authorizing Provider  cyclobenzaprine (FLEXERIL) 10 MG tablet Take 1 tablet (10 mg total) by mouth 3 (three) times daily as needed. 12/04/19   Sable Feil, PA-C  meloxicam (MOBIC) 15 MG tablet Take 1 tablet (15 mg total) by mouth daily. 12/16/16   Triplett, Cari B, FNP  mometasone-formoterol (DULERA) 200-5 MCG/ACT AERO Inhale 2 puffs into the lungs 2 (two) times daily. 01/18/16   Hower, Aaron Mose, MD  predniSONE (STERAPRED UNI-PAK 21 TAB) 10 MG (21) TBPK tablet Take 1 tablet (10 mg total) by mouth daily. 65m oral 1 day, then 221moral for 2 days, then 1082mral 2 days, then stop 01/18/16   Hower, DavAaron MoseD  senna-docusate (SENOKOT-S) 8.6-50 MG tablet Take 2 tablets by mouth daily as needed for mild constipation. 12/04/19 12/03/20  SmiMardee Postin  K, PA-C  tiotropium (SPIRIVA) 18 MCG inhalation capsule Place 1 capsule (18 mcg total) into inhaler and inhale daily. 01/18/16   Hower, Aaron Mose, MD  traMADol (ULTRAM) 50 MG tablet Take 1 tablet (50 mg total) by mouth every 6 (six) hours as needed for moderate pain. 12/04/19   Sable Feil, PA-C    Physical Exam: Vitals:   11/17/20 2030 11/17/20 2037 11/17/20 2045 11/17/20 2100  BP: (!) 97/51  97/60 (!) 93/52  Pulse: (!) 109  (!) 101 (!) 106  Resp: (!) _0 Temp:      TempSrc:      SpO2: (!) 89%  95% (!) 72% 92%  Weight:      Height:       Constitutional: Thin man resting supine in bed, NAD, calm, comfortable Eyes: PERRL, lids and conjunctivae normal ENMT: Mucous membranes are dry. Posterior pharynx clear of any exudate or lesions.Normal dentition.  Neck: normal, supple, no masses. Respiratory: Distant breath sounds with faint end expiratory wheezing.  Normal respiratory effort. No accessory muscle use.  Cardiovascular: Tachycardic, no murmurs / rubs / gallops. No extremity edema. 2+ pedal pulses. Abdomen: no tenderness, no masses palpated. No hepatosplenomegaly. Bowel sounds positive.  Musculoskeletal: no clubbing / cyanosis. No joint deformity upper and lower extremities. Good ROM, no contractures. Normal muscle tone.  Skin: no rashes, lesions, ulcers. No induration Neurologic: CN 2-12 grossly intact. Sensation intact. Strength 5/5 in all 4.  Psychiatric: Normal judgment and insight. Alert and oriented x 3. Normal mood.  Labs on Admission: I have personally reviewed following labs and imaging studies  CBC: Recent Labs  Lab 11/17/20 1931  WBC 6.5  NEUTROABS 5.1  HGB 17.4*  HCT 46.4  MCV 87.5  PLT 485   Basic Metabolic Panel: Recent Labs  Lab 11/17/20 1931  NA 117*  K 3.5  CL 83*  CO2 19*  GLUCOSE 129*  BUN 7*  CREATININE 0.57*  CALCIUM 8.0*   GFR: Estimated Creatinine Clearance: 82.7 mL/min (A) (by C-G formula based on SCr of 0.57 mg/dL (L)). Liver Function Tests: Recent Labs  Lab 11/17/20 1931  AST 62*  ALT 34  ALKPHOS 65  BILITOT 1.2  PROT 6.8  ALBUMIN 3.3*   No results for input(s): LIPASE, AMYLASE in the last 168 hours. No results for input(s): AMMONIA in the last 168 hours. Coagulation Profile: Recent Labs  Lab 11/17/20 2006  INR 1.0   Cardiac Enzymes: No results for input(s): CKTOTAL, CKMB, CKMBINDEX, TROPONINI in the last 168 hours. BNP (last 3 results) No results for input(s): PROBNP in the last 8760 hours. HbA1C: No results for  input(s): HGBA1C in the last 72 hours. CBG: No results for input(s): GLUCAP in the last 168 hours. Lipid Profile: No results for input(s): CHOL, HDL, LDLCALC, TRIG, CHOLHDL, LDLDIRECT in the last 72 hours. Thyroid Function Tests: No results for input(s): TSH, T4TOTAL, FREET4, T3FREE, THYROIDAB in the last 72 hours. Anemia Panel: No results for input(s): VITAMINB12, FOLATE, FERRITIN, TIBC, IRON, RETICCTPCT in the last 72 hours. Urine analysis:    Component Value Date/Time   COLORURINE YELLOW (A) 12/04/2019 1435   APPEARANCEUR CLEAR (A) 12/04/2019 1435   LABSPEC 1.020 12/04/2019 1435   PHURINE 6.0 12/04/2019 1435   GLUCOSEU NEGATIVE 12/04/2019 1435   HGBUR NEGATIVE 12/04/2019 1435   BILIRUBINUR NEGATIVE 12/04/2019 1435   KETONESUR 20 (A) 12/04/2019 1435   PROTEINUR 30 (A) 12/04/2019 1435   NITRITE NEGATIVE 12/04/2019 1435   LEUKOCYTESUR NEGATIVE 12/04/2019  [place] on Admission: DG Chest Port 1 View  Result Date: 11/17/2020 CLINICAL DATA:  Weakness EXAM: PORTABLE CHEST 1 VIEW COMPARISON:  01/16/2016 FINDINGS: Hyperinflation with emphysematous disease and chronic bronchitic changes. Vague left lung base opacity may reflect acute superimposed mild pneumonia. Normal cardiomediastinal silhouette with aortic atherosclerosis. No pneumothorax IMPRESSION: Hyperinflation with emphysematous disease and chronic bronchitic changes. Vague left lung base opacity may reflect acute superimposed mild pneumonia. Electronically Signed   By: Donavan Foil M.D.   On: 11/17/2020 19:58    EKG: Personally reviewed. Sinus tachycardia, RBBB, PVCs, motion artifact.  Assessment/Plan Principal Problem:   Gastroenteritis due to COVID-19 virus Active Problems:   Pneumonia due to COVID-19 virus   Hyponatremia   Generalized weakness   COPD with acute exacerbation ([place])  Michael Padilla is a 65 y.o. male with medical history significant for COPD and tobacco use who was admitted with COVID-19  pneumonia and gastroenteritis.  COVID-19 pneumonia and gastroenteritis: SARS-CoV-2 antigen + 11/17/2020.  Patient with persistent GI symptoms and CXR findings suggestive of developing early left lung base pneumonia. -Start IV Solu-Medrol 40 mg twice daily -Start IV remdesivir -Supplemental oxygen as needed -Incentive spirometer, flutter valve, scheduled Combivent with as needed albuterol -Mobilize with PT  Hyponatremia: Hypovolemic hyponatremia due to GI losses.  Sodium 117 on admission.  Given 1 L LR in the ED.  Start gentle IV NS_0  mL/hour overnight.  COPD with acute exacerbation: Triggered by COVID-19 pneumonia.  Continue steroids and inhalers as above.  Generalized weakness: Secondary to COVID-19 viral illness and dehydration.  Request PT eval.  Tobacco use: Smoking half pack per day.  Nicotine patch ordered.  DVT prophylaxis: Lovenox Code Status: Full code, confirmed with patient Family Communication: Discussed with patient, he has discussed with family Disposition Plan: From home and likely discharge to home pending improvement in COVID-19 pneumonia, gastroenteritis, and hyponatremia Consults called: None Level of care: Med-Surg Admission status:  Status is: Inpatient  Remains inpatient appropriate because:Hemodynamically unstable, Persistent severe electrolyte disturbances, IV treatments appropriate due to intensity of illness or inability to take PO and Inpatient level of care appropriate due to severity of illness   Dispo: The patient is from: Home              Anticipated d/c is to: Home              Anticipated d/c date is: 2 days              Patient currently is not medically stable to d/c.   Difficult to place patient No  Zada Finders MD [facility]  If 7PM-7AM, please contact night-coverage [email or web address]  11/17/2020, 9:35 PM

## 2020-11-18 DIAGNOSIS — J1282 Pneumonia due to coronavirus disease 2019: Secondary | ICD-10-CM

## 2020-11-18 DIAGNOSIS — E871 Hypo-osmolality and hyponatremia: Secondary | ICD-10-CM

## 2020-11-18 DIAGNOSIS — J441 Chronic obstructive pulmonary disease with (acute) exacerbation: Secondary | ICD-10-CM

## 2020-11-18 LAB — COMPREHENSIVE METABOLIC PANEL
ALT: 30 U/L (ref 0–44)
AST: 46 U/L — ABNORMAL HIGH (ref 15–41)
Albumin: 2.9 g/dL — ABNORMAL LOW (ref 3.5–5.0)
Alkaline Phosphatase: 59 U/L (ref 38–126)
Anion gap: 11 (ref 5–15)
BUN: 7 mg/dL — ABNORMAL LOW (ref 8–23)
CO2: 26 mmol/L (ref 22–32)
Calcium: 7.8 mg/dL — ABNORMAL LOW (ref 8.9–10.3)
Chloride: 84 mmol/L — ABNORMAL LOW (ref 98–111)
Creatinine, Ser: 0.43 mg/dL — ABNORMAL LOW (ref 0.61–1.24)
GFR, Estimated: >60 mL/min (ref >60)
Glucose, Bld: 140 mg/dL — ABNORMAL HIGH (ref 70–99)
Potassium: 3.6 mmol/L (ref 3.5–5.1)
Sodium: 121 mmol/L — ABNORMAL LOW (ref 135–145)
Total Bilirubin: 0.7 mg/dL (ref 0.3–1.2)
Total Protein: 6.1 g/dL — ABNORMAL LOW (ref 6.5–8.1)

## 2020-11-18 LAB — CBC WITH DIFFERENTIAL/PLATELET
Abs Immature Granulocytes: 0.05 10*3/uL (ref 0.00–0.07)
Basophils Absolute: 0 10*3/uL (ref 0.0–0.1)
Basophils Relative: 0 %
Eosinophils Absolute: 0 10*3/uL (ref 0.0–0.5)
Eosinophils Relative: 0 %
HCT: 46.4 % (ref 39.0–52.0)
Hemoglobin: 16.9 g/dL (ref 13.0–17.0)
Immature Granulocytes: 1 %
Lymphocytes Relative: 5 %
Lymphs Abs: 0.3 10*3/uL — ABNORMAL LOW (ref 0.7–4.0)
MCH: 32.4 pg (ref 26.0–34.0)
MCHC: 36.4 g/dL — ABNORMAL HIGH (ref 30.0–36.0)
MCV: 89.1 fL (ref 80.0–100.0)
Monocytes Absolute: 0.3 10*3/uL (ref 0.1–1.0)
Monocytes Relative: 6 %
Neutro Abs: 4.4 10*3/uL (ref 1.7–7.7)
Neutrophils Relative %: 88 %
Platelets: 325 10*3/uL (ref 150–400)
RBC: 5.21 MIL/uL (ref 4.22–5.81)
RDW: 13.1 % (ref 11.5–15.5)
WBC: 5 10*3/uL (ref 4.0–10.5)
nRBC: 0 % (ref 0.0–0.2)

## 2020-11-18 LAB — BASIC METABOLIC PANEL
Anion gap: 14 (ref 5–15)
BUN: 7 mg/dL — ABNORMAL LOW (ref 8–23)
CO2: 22 mmol/L (ref 22–32)
Calcium: 8 mg/dL — ABNORMAL LOW (ref 8.9–10.3)
Chloride: 82 mmol/L — ABNORMAL LOW (ref 98–111)
Creatinine, Ser: 0.48 mg/dL — ABNORMAL LOW (ref 0.61–1.24)
GFR, Estimated: >60 mL/min (ref >60)
Glucose, Bld: 112 mg/dL — ABNORMAL HIGH (ref 70–99)
Potassium: 3.7 mmol/L (ref 3.5–5.1)
Sodium: 118 mmol/L — CL (ref 135–145)

## 2020-11-18 LAB — PHOSPHORUS: Phosphorus: 2.6 mg/dL (ref 2.5–4.6)

## 2020-11-18 LAB — FIBRIN DERIVATIVES D-DIMER (ARMC ONLY): Fibrin derivatives D-dimer (ARMC): 1144.36 ng/mL (FEU) — ABNORMAL HIGH (ref 0.00–499.00)

## 2020-11-18 LAB — CK: Total CK: 167 U/L (ref 49–397)

## 2020-11-18 LAB — PROCALCITONIN: Procalcitonin: 0.12 ng/mL

## 2020-11-18 LAB — MAGNESIUM: Magnesium: 2.3 mg/dL (ref 1.7–2.4)

## 2020-11-18 LAB — HIV ANTIBODY (ROUTINE TESTING W REFLEX): HIV Screen 4th Generation wRfx: NONREACTIVE

## 2020-11-18 LAB — C-REACTIVE PROTEIN: CRP: 10.2 mg/dL — ABNORMAL HIGH (ref <1.0)

## 2020-11-18 LAB — LACTIC ACID, PLASMA: Lactic Acid, Venous: 1.3 mmol/L (ref 0.5–1.9)

## 2020-11-18 LAB — FERRITIN: Ferritin: 476 ng/mL — ABNORMAL HIGH (ref 24–336)

## 2020-11-18 MED ORDER — SODIUM CHLORIDE 1 G PO TABS
1.0000 g | ORAL_TABLET | Freq: Every day | ORAL | Status: DC
  Administered 2020-11-18 – 2020-11-19 (×2): 1 g via ORAL
  Filled 2020-11-18 (×3): qty 1

## 2020-11-18 MED ORDER — SODIUM CHLORIDE 0.9 % IV SOLN: INTRAVENOUS | Status: DC

## 2020-11-18 NOTE — ED Notes (Signed)
Report given to Kendall RN 

## 2020-11-18 NOTE — Evaluation (Signed)
Physical Therapy Evaluation Patient Details Name: Michael Padilla MRN: [id number] DOB: 1956-03-24 Today's Date: 11/18/2020   History of Present Illness  Pt is a 65 y.o. male with medical history significant for COPD and tobacco use who presents to the ED for evaluation of generalized weakness and diarrhea. Pt also did have a fall at home, unable to get up.    Clinical Impression  Patient alert, oriented to self, place, situation but could not recall month/year (knew it was Wednesday). Reported at baseline he is independent, occasionally takes a sponge bath if he feels tired to avoid the tub/shower. No other falls.   The patient demonstrated supine <> sit with supervision. Sit <> stand with RW and CGA twice from elevated surface. First attempt on 3L pt desaturated to 70s, HR in 130s-140s. Returned to sitting and bumped to 6L, but had to return to supine to rest to increased to high 80s low 90s. Returned to standing and pt able to ambulate ~25ft with RW and CGA, but HR elevated to 150s. Returned to 3L at end of session, in bed with all needs in reach.  Overall the patient demonstrated deficits (see "PT Problem List") that impede the patient's functional abilities, safety, and mobility and would benefit from skilled PT intervention. Recommendation is SNF due to current level of assistance needed, decreased endurance/activity tolerance and acute changes from PLOF.     Follow Up Recommendations SNF    Equipment Recommendations  Rolling walker with 5" wheels    Recommendations for Other Services       Precautions / Restrictions Precautions Precautions: Fall Precaution Comments: watch HR/spO2 Restrictions Weight Bearing Restrictions: No      Mobility  Bed Mobility Overal bed mobility: Needs Assistance Bed Mobility: Supine to Sit;Sit to Supine     Supine to sit: Supervision;HOB elevated Sit to supine: Supervision;HOB elevated        Transfers Overall transfer level: Needs  assistance Equipment used: Rolling walker (2 wheeled) Transfers: Sit to/from Stand Sit to Stand: Min guard;From elevated surface            Ambulation/Gait Ambulation/Gait assistance: Min guard Gait Distance (Feet): 5 Feet Assistive device: Rolling walker (2 wheeled)       General Gait Details: pt able to step forwards/backwards at EOB, HR in 150s further mobility deferred  Stairs            Wheelchair Mobility    Modified Rankin (Stroke Patients Only)       Balance Overall balance assessment: Needs assistance Sitting-balance support: Bilateral upper extremity supported Sitting balance-Leahy Scale: Fair       Standing balance-Leahy Scale: Poor Standing balance comment: reliant on UE support                             Pertinent Vitals/Pain Pain Assessment: No/denies pain    Home Living Family/patient expects to be discharged to:: Private residence Living Arrangements: Children Available Help at Discharge: Available PRN/intermittently Type of Home: Apartment Home Access: Level entry     Home Layout: One level Home Equipment: Walker - 4 wheels;Walker - standard      Prior Function Level of Independence: Independent         Comments: no other falls prior to fall before admission to ED     Hand Dominance   Dominant Hand: Right    Extremity/Trunk Assessment   Upper Extremity Assessment Upper Extremity Assessment: Overall WFL for tasks assessed  Lower Extremity Assessment Lower Extremity Assessment: Generalized weakness    Cervical / Trunk Assessment Cervical / Trunk Assessment: Normal  Communication   Communication: No difficulties  Cognition Arousal/Alertness: Awake/alert Behavior During Therapy: WFL for tasks assessed/performed Overall Cognitive Status: Within Functional Limits for tasks assessed                                        General Comments      Exercises Other Exercises Other  Exercises: spO2 into the 70s with first standing, increasd to 6L. with rest break and time increased to high 80s with 6L, and able to ambulate a short distance at bedside. Returned to 3L at rest, spO2 >88%   Assessment/Plan    PT Assessment Patient needs continued PT services  PT Problem List Decreased strength;Decreased mobility;Decreased activity tolerance;Decreased balance;Decreased knowledge of use of DME       PT Treatment Interventions DME instruction;Therapeutic exercise;Balance training;Gait training;Stair training;Neuromuscular re-education;Functional mobility training;Therapeutic activities;Patient/family education    PT Goals (Current goals can be found in the Care Plan section)  Acute Rehab PT Goals Patient Stated Goal: to get stronger PT Goal Formulation: With patient Time For Goal Achievement: 12/02/20 Potential to Achieve Goals: Good    Frequency Min 2X/week   Barriers to discharge        Co-evaluation               AM-PAC PT "6 Clicks" Mobility  Outcome Measure Help needed turning from your back to your side while in a flat bed without using bedrails?: None Help needed moving from lying on your back to sitting on the side of a flat bed without using bedrails?: None Help needed moving to and from a bed to a chair (including a wheelchair)?: A Little Help needed standing up from a chair using your arms (e.g., wheelchair or bedside chair)?: A Little Help needed to walk in hospital room?: A Lot Help needed climbing 3-5 steps with a railing? : A Lot 6 Click Score: 18    End of Session Equipment Utilized During Treatment: Gait belt Activity Tolerance: Treatment limited secondary to medical complications (Comment);Other (comment) (elevated HR) Patient left: in bed;with call bell/phone within reach Nurse Communication: Mobility status PT Visit Diagnosis: Other abnormalities of gait and mobility (R26.89);Muscle weakness (generalized) (M62.81);Difficulty in  walking, not elsewhere classified (R26.2)    Time: 5329-9242 PT Time Calculation (min) (ACUTE ONLY): 34 min   Charges:   PT Evaluation $PT Eval Low Complexity: 1 Low PT Treatments $Therapeutic Exercise: 8-22 mins       Olga Coaster PT, DPT 1:18 PM,11/18/20

## 2020-11-18 NOTE — ED Notes (Signed)
Will move pt as soon as room on floor is clean

## 2020-11-18 NOTE — Progress Notes (Addendum)
PROGRESS NOTE    Michael Padilla  LDJ:[id number] DOB: 10-12-1956 DOA: 11/17/2020 PCP: Patient, No Pcp Per   Chief complaint.  Nausea vomiting diarrhea. Brief Narrative:  Michael Padilla is a 65 y.o. male with medical history significant for COPD and tobacco use who presents to the ED for evaluation of generalized weakness and diarrhea.  Sodium level was 117, bicarb 19.  He was giving IV fluids for dehydration.  Patient was also positive for Covid.  Currently on 2 L oxygen, he is on remdesivir and steroids.   Assessment & Plan:   Principal Problem:   Gastroenteritis due to COVID-19 virus Active Problems:   Pneumonia due to COVID-19 virus   Hyponatremia   Generalized weakness   COPD with acute exacerbation (HCC)  1.  Hyponatremia secondary to dehydration. COVID-19 pneumonia. Viral gastroenteritis secondary to Covid. Saturation dropped down to 89%, is placed on 2 L oxygen.  Currently does not have a significant short of breath. Continue steroids and remdesivir. Patient also has significant dehydration secondary to gastroenteritis. Sodium level still low today, will continue normal saline.  2.  COPD exacerbation. Continue IV steroids. Patient does not have additional bronchospasm today.  Addendum: 0300.Patient has increased O2 requirement. DC NS. Fluid restriction, sodium chloride 1 gram daily.     DVT prophylaxis: Lovenox Code Status: Full Family Communication:  Disposition Plan:  .   Status is: Inpatient  Remains inpatient appropriate because:Inpatient level of care appropriate due to severity of illness   Dispo: The patient is from: Home              Anticipated d/c is to: Home              Anticipated d/c date is: 2 days              Patient currently is not medically stable to d/c.   Difficult to place patient No        I/O last 3 completed shifts: In: 1165.5 [IV Piggyback:1165.5] Out: -  No intake/output data recorded.     Consultants:    None  Procedures: None  Antimicrobials: None  Subjective: Patient feels better today, nausea vomiting improving, tolerating diet.  No additional bowel movement since admission to the hospital. On 2 L oxygen, denies any short of breath, no cough. No fever or chills. No dysuria or hematuria. No headache or dizziness.   Objective: Vitals:   11/18/20 1015 11/18/20 1100 11/18/20 1115 11/18/20 1227  BP: 121/90 120/89 (!) 117/91 (!) 139/94  Pulse: 65  (!) 111 (!) 113  Resp: 18  16 20   Temp: 97.8 F (36.6 C)     TempSrc: Oral     SpO2: 94%  90% 93%  Weight:      Height:        Intake/Output Summary (Last 24 hours) at 11/18/2020 1258 Last data filed at 11/17/2020 2125 Gross per 24 hour  Intake 1165.5 ml  Output --  Net 1165.5 ml   Filed Weights   11/17/20 1923  Weight: 63.5 kg    Examination:  General exam: Appears calm and comfortable  Respiratory system: Clear to auscultation. Respiratory effort normal. Cardiovascular system: S1 & S2 heard, RRR. No JVD, murmurs, rubs, gallops or clicks. No pedal edema. Gastrointestinal system: Abdomen is nondistended, soft and nontender. No organomegaly or masses felt. Normal bowel sounds heard. Central nervous system: Alert and oriented. No focal neurological deficits. Extremities: Symmetric 5 x 5 power. Skin: No rashes, lesions or ulcers Psychiatry:  Mood & affect appropriate.     Data Reviewed: I have personally reviewed following labs and imaging studies  CBC: Recent Labs  Lab 11/17/20 1931 11/18/20 0455  WBC 6.5 5.0  NEUTROABS 5.1 4.4  HGB 17.4* 16.9  HCT 46.4 46.4  MCV 87.5 89.1  PLT 361 325   Basic Metabolic Panel: Recent Labs  Lab 11/17/20 1931 11/17/20 2221 11/18/20 0455  NA 117* 118* 121*  K 3.5 3.7 3.6  CL 83* 82* 84*  CO2 19* 22 26  GLUCOSE 129* 112* 140*  BUN 7* 7* 7*  CREATININE 0.57* 0.48* 0.43*  CALCIUM 8.0* 8.0* 7.8*  MG  --   --  2.3  PHOS  --   --  2.6   GFR: Estimated Creatinine  Clearance: 82.7 mL/min (A) (by C-G formula based on SCr of 0.43 mg/dL (L)). Liver Function Tests: Recent Labs  Lab 11/17/20 1931 11/18/20 0455  AST 62* 46*  ALT 34 30  ALKPHOS 65 59  BILITOT 1.2 0.7  PROT 6.8 6.1*  ALBUMIN 3.3* 2.9*   No results for input(s): LIPASE, AMYLASE in the last 168 hours. No results for input(s): AMMONIA in the last 168 hours. Coagulation Profile: Recent Labs  Lab 11/17/20 2006  INR 1.0   Cardiac Enzymes: Recent Labs  Lab 11/17/20 2221  CKTOTAL 167   BNP (last 3 results) No results for input(s): PROBNP in the last 8760 hours. HbA1C: No results for input(s): HGBA1C in the last 72 hours. CBG: No results for input(s): GLUCAP in the last 168 hours. Lipid Profile: No results for input(s): CHOL, HDL, LDLCALC, TRIG, CHOLHDL, LDLDIRECT in the last 72 hours. Thyroid Function Tests: No results for input(s): TSH, T4TOTAL, FREET4, T3FREE, THYROIDAB in the last 72 hours. Anemia Panel: Recent Labs    11/18/20 0455  FERRITIN 476*   Sepsis Labs: Recent Labs  Lab 11/17/20 1948 11/17/20 2221 11/18/20 0620  PROCALCITON  --  0.12  --   LATICACIDVEN 2.3*  --  1.3    Recent Results (from the past 240 hour(s))  Blood culture (routine single)     Status: None (Preliminary result)   Collection Time: 11/17/20  8:06 PM   Specimen: BLOOD  Result Value Ref Range Status   Specimen Description BLOOD LEFT ANTECUBITAL  Final   Special Requests   Final    BOTTLES DRAWN AEROBIC AND ANAEROBIC Blood Culture adequate volume   Culture   Final    NO GROWTH < 12 HOURS Performed at [facility], [address] [place], [place] [address]    Report Status PENDING  Incomplete         Radiology Studies: DG Chest Port 1 View  Result Date: 11/17/2020 CLINICAL DATA:  Weakness EXAM: PORTABLE CHEST 1 VIEW COMPARISON:  01/16/2016 FINDINGS: Hyperinflation with emphysematous disease and chronic bronchitic changes. Vague left lung base opacity may reflect  acute superimposed mild pneumonia. Normal cardiomediastinal silhouette with aortic atherosclerosis. No pneumothorax IMPRESSION: Hyperinflation with emphysematous disease and chronic bronchitic changes. Vague left lung base opacity may reflect acute superimposed mild pneumonia. Electronically Signed   By: Jasmine Pang M.D.   On: 11/17/2020 19:58        Scheduled Meds: . vitamin C  500 mg Oral Daily  . enoxaparin (LOVENOX) injection  40 mg Subcutaneous Q24H  . Ipratropium-Albuterol  1 puff Inhalation Q6H  . methylPREDNISolone (SOLU-MEDROL) injection  40 mg Intravenous Q12H  . nicotine  21 mg Transdermal Daily  . zinc sulfate  220 mg Oral  Daily   Continuous Infusions: . remdesivir 100 mg in NS 100 mL Stopped (11/18/20 1020)     LOS: 1 day    Time spent: 27 minutes     Marrion Coy, MD [facility]   To contact the attending provider between 7A-7P or the covering provider during after hours 7P-7A, please log into the web site [email or web address] and access using universal [place] password for that web site. If you do not have the password, please call the hospital operator.  11/18/2020, 12:58 PM

## 2020-11-18 NOTE — ED Notes (Signed)
MD Para March was notified of Osmolality 245

## 2020-11-19 DIAGNOSIS — J9601 Acute respiratory failure with hypoxia: Secondary | ICD-10-CM

## 2020-11-19 DIAGNOSIS — U071 COVID-19: Secondary | ICD-10-CM

## 2020-11-19 LAB — COMPREHENSIVE METABOLIC PANEL
ALT: 25 U/L (ref 0–44)
AST: 33 U/L (ref 15–41)
Albumin: 2.7 g/dL — ABNORMAL LOW (ref 3.5–5.0)
Alkaline Phosphatase: 53 U/L (ref 38–126)
Anion gap: 9 (ref 5–15)
BUN: 12 mg/dL (ref 8–23)
CO2: 26 mmol/L (ref 22–32)
Calcium: 7.9 mg/dL — ABNORMAL LOW (ref 8.9–10.3)
Chloride: 92 mmol/L — ABNORMAL LOW (ref 98–111)
Creatinine, Ser: 0.45 mg/dL — ABNORMAL LOW (ref 0.61–1.24)
GFR, Estimated: >60 mL/min (ref >60)
Glucose, Bld: 144 mg/dL — ABNORMAL HIGH (ref 70–99)
Potassium: 3.2 mmol/L — ABNORMAL LOW (ref 3.5–5.1)
Sodium: 127 mmol/L — ABNORMAL LOW (ref 135–145)
Total Bilirubin: 0.7 mg/dL (ref 0.3–1.2)
Total Protein: 5.6 g/dL — ABNORMAL LOW (ref 6.5–8.1)

## 2020-11-19 LAB — URINALYSIS, COMPLETE (UACMP) WITH MICROSCOPIC
Bacteria, UA: NONE SEEN
Bilirubin Urine: NEGATIVE
Glucose, UA: 50 mg/dL — AB
Hgb urine dipstick: NEGATIVE
Ketones, ur: NEGATIVE mg/dL
Leukocytes,Ua: NEGATIVE
Nitrite: NEGATIVE
Protein, ur: NEGATIVE mg/dL
Specific Gravity, Urine: 1.019 (ref 1.005–1.030)
Squamous Epithelial / HPF: NONE SEEN (ref 0–5)
pH: 6 (ref 5.0–8.0)

## 2020-11-19 LAB — CBC WITH DIFFERENTIAL/PLATELET
Abs Immature Granulocytes: 0.08 10*3/uL — ABNORMAL HIGH (ref 0.00–0.07)
Basophils Absolute: 0 10*3/uL (ref 0.0–0.1)
Basophils Relative: 0 %
Eosinophils Absolute: 0 10*3/uL (ref 0.0–0.5)
Eosinophils Relative: 0 %
HCT: 41.4 % (ref 39.0–52.0)
Hemoglobin: 15.1 g/dL (ref 13.0–17.0)
Immature Granulocytes: 1 %
Lymphocytes Relative: 5 %
Lymphs Abs: 0.6 10*3/uL — ABNORMAL LOW (ref 0.7–4.0)
MCH: 32.6 pg (ref 26.0–34.0)
MCHC: 36.5 g/dL — ABNORMAL HIGH (ref 30.0–36.0)
MCV: 89.4 fL (ref 80.0–100.0)
Monocytes Absolute: 1 10*3/uL (ref 0.1–1.0)
Monocytes Relative: 8 %
Neutro Abs: 10.4 10*3/uL — ABNORMAL HIGH (ref 1.7–7.7)
Neutrophils Relative %: 86 %
Platelets: 397 10*3/uL (ref 150–400)
RBC: 4.63 MIL/uL (ref 4.22–5.81)
RDW: 13.2 % (ref 11.5–15.5)
WBC: 12 10*3/uL — ABNORMAL HIGH (ref 4.0–10.5)
nRBC: 0 % (ref 0.0–0.2)

## 2020-11-19 LAB — FERRITIN: Ferritin: 486 ng/mL — ABNORMAL HIGH (ref 24–336)

## 2020-11-19 LAB — PHOSPHORUS: Phosphorus: 2.1 mg/dL — ABNORMAL LOW (ref 2.5–4.6)

## 2020-11-19 LAB — FIBRIN DERIVATIVES D-DIMER (ARMC ONLY): Fibrin derivatives D-dimer (ARMC): 734.63 ng/mL (FEU) — ABNORMAL HIGH (ref 0.00–499.00)

## 2020-11-19 LAB — MAGNESIUM: Magnesium: 2.3 mg/dL (ref 1.7–2.4)

## 2020-11-19 LAB — C-REACTIVE PROTEIN: CRP: 5 mg/dL — ABNORMAL HIGH (ref <1.0)

## 2020-11-19 LAB — OSMOLALITY, URINE: Osmolality, Ur: 637 mOsm/kg (ref 300–900)

## 2020-11-19 MED ORDER — POTASSIUM & SODIUM PHOSPHATES 280-160-250 MG PO PACK
2.0000 | PACK | Freq: Three times a day (TID) | ORAL | Status: AC
  Administered 2020-11-19 – 2020-11-20 (×4): 2 via ORAL
  Filled 2020-11-19 (×4): qty 2

## 2020-11-19 MED ORDER — POTASSIUM CHLORIDE 10 MEQ/100ML IV SOLN
10.0000 meq | INTRAVENOUS | Status: AC
  Administered 2020-11-19 (×2): 10 meq via INTRAVENOUS
  Filled 2020-11-19 (×2): qty 100

## 2020-11-19 NOTE — Progress Notes (Signed)
PROGRESS NOTE    Michael Padilla  VZD:[id number] DOB: 1956-04-06 DOA: 11/17/2020 PCP: Patient, No Pcp Per   Chief complaint.  Nausea vomiting and diarrhea. Brief Narrative:  Michael Padilla a 65 y.o.malewith medical history significant forCOPDand tobacco usewho presents to the ED for evaluation of generalized weakness and diarrhea.  Sodium level was 117, bicarb 19.  He was giving IV fluids for dehydration.  Patient was also positive for Covid.  Currently on 2 L oxygen, he is on remdesivir and steroids.   Assessment & Plan:   Principal Problem:   Gastroenteritis due to COVID-19 virus Active Problems:   Pneumonia due to COVID-19 virus   Hyponatremia   Generalized weakness   COPD with acute exacerbation (HCC)  #1.  Hyponatremia secondary to dehydration. Hypokalemia and hypophosphatemia. COVID-19 pneumonia. Viral gastroenteritis secondary to Covid. Hypoxemia secondary to Covid infection. Patient is now requiring 3 L oxygen, fluids were discontinued later yesterday. Continue salt tablets 1 g daily for hyponatremia, sodium level gradually better. Continue remdesivir and steroids for hypoxemia associated with Covid. Obtain potassium and phosphorus.  2.  COPD exacerbation. Continue IV steroids.     DVT prophylaxis: Lovenox Code Status: full Family Communication: Called, could not reach son  Disposition Plan:  .   Status is: Inpatient  Remains inpatient appropriate because:Inpatient level of care appropriate due to severity of illness   Dispo: The patient is from: Home              Anticipated d/c is to: Home              Anticipated d/c date is: 2 days              Patient currently is not medically stable to d/c.   Difficult to place patient No        I/O last 3 completed shifts: In: 1485.5 [P.O.:320; IV Piggyback:1165.5] Out: -  Total I/O In: -  Out: 500 [Urine:500]     Consultants:   None  Procedures: None  Antimicrobials:  None  Subjective: Currently on 3 L oxygen, but he does not feel short of breath. Denies any abdominal pain or nausea vomiting.  Diarrhea has resolved. No fever or chills No dysuria hematuria. No headache or dizziness. No chest pain or palpitation.  Objective: Vitals:   11/18/20 2207 11/18/20 2245 11/19/20 0601 11/19/20 0745  BP: (!) 129/102 (!) 124/98 131/87 (!) 123/100  Pulse: (!) 112  (!) 57 91  Resp: 18  19   Temp: 98.2 F (36.8 C)  97.7 F (36.5 C) 98 F (36.7 C)  TempSrc: Oral  Oral Oral  SpO2: 95%  97% 98%  Weight:      Height:        Intake/Output Summary (Last 24 hours) at 11/19/2020 1029 Last data filed at 11/19/2020 0911 Gross per 24 hour  Intake 320 ml  Output 500 ml  Net -180 ml   Filed Weights   11/17/20 1923  Weight: 63.5 kg    Examination:  General exam: Appears calm and comfortable  Respiratory system: Decreased breathing sounds. Respiratory effort normal. Cardiovascular system: S1 & S2 heard, RRR. No JVD, murmurs, rubs, gallops or clicks. No pedal edema. Gastrointestinal system: Abdomen is nondistended, soft and nontender. No organomegaly or masses felt. Normal bowel sounds heard. Central nervous system: Alert and oriented. No focal neurological deficits. Extremities: Symmetric Skin: No rashes, lesions or ulcers Psychiatry: Mood & affect appropriate.     Data Reviewed: I have personally reviewed  following labs and imaging studies  CBC: Recent Labs  Lab 11/17/20 1931 11/18/20 0455 11/19/20 0558  WBC 6.5 5.0 12.0*  NEUTROABS 5.1 4.4 10.4*  HGB 17.4* 16.9 15.1  HCT 46.4 46.4 41.4  MCV 87.5 89.1 89.4  PLT 361 325 397   Basic Metabolic Panel: Recent Labs  Lab 11/17/20 1931 11/17/20 2221 11/18/20 0455 11/19/20 0558  NA 117* 118* 121* 127*  K 3.5 3.7 3.6 3.2*  CL 83* 82* 84* 92*  CO2 19* 22 26 26   GLUCOSE 129* 112* 140* 144*  BUN 7* 7* 7* 12  CREATININE 0.57* 0.48* 0.43* 0.45*  CALCIUM 8.0* 8.0* 7.8* 7.9*  MG  --   --  2.3 2.3   PHOS  --   --  2.6 2.1*   GFR: Estimated Creatinine Clearance: 82.7 mL/min (A) (by C-G formula based on SCr of 0.45 mg/dL (L)). Liver Function Tests: Recent Labs  Lab 11/17/20 1931 11/18/20 0455 11/19/20 0558  AST 62* 46* 33  ALT 34 30 25  ALKPHOS 65 59 53  BILITOT 1.2 0.7 0.7  PROT 6.8 6.1* 5.6*  ALBUMIN 3.3* 2.9* 2.7*   No results for input(s): LIPASE, AMYLASE in the last 168 hours. No results for input(s): AMMONIA in the last 168 hours. Coagulation Profile: Recent Labs  Lab 11/17/20 2006  INR 1.0   Cardiac Enzymes: Recent Labs  Lab 11/17/20 2221  CKTOTAL 167   BNP (last 3 results) No results for input(s): PROBNP in the last 8760 hours. HbA1C: No results for input(s): HGBA1C in the last 72 hours. CBG: No results for input(s): GLUCAP in the last 168 hours. Lipid Profile: No results for input(s): CHOL, HDL, LDLCALC, TRIG, CHOLHDL, LDLDIRECT in the last 72 hours. Thyroid Function Tests: No results for input(s): TSH, T4TOTAL, FREET4, T3FREE, THYROIDAB in the last 72 hours. Anemia Panel: Recent Labs    11/18/20 0455 11/19/20 0558  FERRITIN 476* 486*   Sepsis Labs: Recent Labs  Lab 11/17/20 1948 11/17/20 2221 11/18/20 0620  PROCALCITON  --  0.12  --   LATICACIDVEN 2.3*  --  1.3    Recent Results (from the past 240 hour(s))  Blood culture (routine single)     Status: None (Preliminary result)   Collection Time: 11/17/20  8:06 PM   Specimen: BLOOD  Result Value Ref Range Status   Specimen Description BLOOD LEFT ANTECUBITAL  Final   Special Requests   Final    BOTTLES DRAWN AEROBIC AND ANAEROBIC Blood Culture adequate volume   Culture   Final    NO GROWTH 2 DAYS Performed at [facility], [address] [place], [place]    Report Status PENDING  Incomplete         Radiology Studies: DG Chest Port 1 View  Result Date: 11/17/2020 CLINICAL DATA:  Weakness EXAM: PORTABLE CHEST 1 VIEW COMPARISON:  01/16/2016 FINDINGS:  Hyperinflation with emphysematous disease and chronic bronchitic changes. Vague left lung base opacity may reflect acute superimposed mild pneumonia. Normal cardiomediastinal silhouette with aortic atherosclerosis. No pneumothorax IMPRESSION: Hyperinflation with emphysematous disease and chronic bronchitic changes. Vague left lung base opacity may reflect acute superimposed mild pneumonia. Electronically Signed   By: 01/18/2016 M.D.   On: 11/17/2020 19:58        Scheduled Meds: . vitamin C  500 mg Oral Daily  . enoxaparin (LOVENOX) injection  40 mg Subcutaneous Q24H  . Ipratropium-Albuterol  1 puff Inhalation Q6H  . methylPREDNISolone (SOLU-MEDROL) injection  40 mg Intravenous Q12H  .  nicotine  21 mg Transdermal Daily  . sodium chloride  1 g Oral Daily  . zinc sulfate  220 mg Oral Daily   Continuous Infusions: . potassium chloride 10 mEq (11/19/20 1029)  . remdesivir 100 mg in NS 100 mL 100 mg (11/19/20 0752)     LOS: 2 days    Time spent: 28 minutes    Marrion Coy, MD [facility]   To contact the attending provider between 7A-7P or the covering provider during after hours 7P-7A, please log into the web site [email or web address] and access using universal [place] password for that web site. If you do not have the password, please call the hospital operator.  11/19/2020, 10:29 AM

## 2020-11-20 ENCOUNTER — Inpatient Hospital Stay: Payer: Medicare HMO

## 2020-11-20 LAB — COMPREHENSIVE METABOLIC PANEL
ALT: 24 U/L (ref 0–44)
AST: 32 U/L (ref 15–41)
Albumin: 2.6 g/dL — ABNORMAL LOW (ref 3.5–5.0)
Alkaline Phosphatase: 54 U/L (ref 38–126)
Anion gap: 8 (ref 5–15)
BUN: 12 mg/dL (ref 8–23)
CO2: 28 mmol/L (ref 22–32)
Calcium: 7.6 mg/dL — ABNORMAL LOW (ref 8.9–10.3)
Chloride: 96 mmol/L — ABNORMAL LOW (ref 98–111)
Creatinine, Ser: 0.55 mg/dL — ABNORMAL LOW (ref 0.61–1.24)
GFR, Estimated: >60 mL/min (ref >60)
Glucose, Bld: 149 mg/dL — ABNORMAL HIGH (ref 70–99)
Potassium: 3.5 mmol/L (ref 3.5–5.1)
Sodium: 132 mmol/L — ABNORMAL LOW (ref 135–145)
Total Bilirubin: 0.8 mg/dL (ref 0.3–1.2)
Total Protein: 5.5 g/dL — ABNORMAL LOW (ref 6.5–8.1)

## 2020-11-20 LAB — CBC WITH DIFFERENTIAL/PLATELET
Abs Immature Granulocytes: 0.07 10*3/uL (ref 0.00–0.07)
Basophils Absolute: 0 10*3/uL (ref 0.0–0.1)
Basophils Relative: 0 %
Eosinophils Absolute: 0 10*3/uL (ref 0.0–0.5)
Eosinophils Relative: 0 %
HCT: 41.7 % (ref 39.0–52.0)
Hemoglobin: 14.7 g/dL (ref 13.0–17.0)
Immature Granulocytes: 1 %
Lymphocytes Relative: 5 %
Lymphs Abs: 0.5 10*3/uL — ABNORMAL LOW (ref 0.7–4.0)
MCH: 32 pg (ref 26.0–34.0)
MCHC: 35.3 g/dL (ref 30.0–36.0)
MCV: 90.7 fL (ref 80.0–100.0)
Monocytes Absolute: 0.8 10*3/uL (ref 0.1–1.0)
Monocytes Relative: 7 %
Neutro Abs: 9.2 10*3/uL — ABNORMAL HIGH (ref 1.7–7.7)
Neutrophils Relative %: 87 %
Platelets: 442 10*3/uL — ABNORMAL HIGH (ref 150–400)
RBC: 4.6 MIL/uL (ref 4.22–5.81)
RDW: 13.6 % (ref 11.5–15.5)
WBC: 10.6 10*3/uL — ABNORMAL HIGH (ref 4.0–10.5)
nRBC: 0 % (ref 0.0–0.2)

## 2020-11-20 LAB — FERRITIN: Ferritin: 429 ng/mL — ABNORMAL HIGH (ref 24–336)

## 2020-11-20 LAB — MAGNESIUM: Magnesium: 2.3 mg/dL (ref 1.7–2.4)

## 2020-11-20 LAB — BRAIN NATRIURETIC PEPTIDE: B Natriuretic Peptide: 177.1 pg/mL — ABNORMAL HIGH (ref 0.0–100.0)

## 2020-11-20 LAB — C-REACTIVE PROTEIN: CRP: 2.8 mg/dL — ABNORMAL HIGH (ref <1.0)

## 2020-11-20 LAB — PHOSPHORUS: Phosphorus: 2.8 mg/dL (ref 2.5–4.6)

## 2020-11-20 LAB — FIBRIN DERIVATIVES D-DIMER (ARMC ONLY): Fibrin derivatives D-dimer (ARMC): 567.62 ng/mL (FEU) — ABNORMAL HIGH (ref 0.00–499.00)

## 2020-11-20 MED ORDER — FUROSEMIDE 40 MG PO TABS
40.0000 mg | ORAL_TABLET | Freq: Once | ORAL | Status: AC
  Administered 2020-11-20: 40 mg via ORAL
  Filled 2020-11-20: qty 1

## 2020-11-20 NOTE — Care Plan (Signed)
Tele called to notify leads are not on. CNA asked to change leads as this Clinical research associate was in with a low sugar in another room. This Clinical research associate asked CNA to help and job was not completed. This writer went into room, changed battery, tele leads and oxygen probe at this time and called Chasity and verified that all monitors were working and that she can see them. Pt in bed with callbell in reach.

## 2020-11-20 NOTE — Care Plan (Signed)
Oxygen wean to 90% on room air. No distress noted. Pulse is 95.Lasix po given at this time. Pt in chair.

## 2020-11-20 NOTE — Progress Notes (Signed)
Physical Therapy Treatment Patient Details Name: Michael Padilla MRN: [id number] DOB: Mar 23, 1956 Today's Date: 11/20/2020    History of Present Illness Pt is a 65 y.o. male with medical history significant for COPD and tobacco use who presents to the ED for evaluation of generalized weakness and diarrhea. Pt also did have a fall at home, unable to get up.    PT Comments    Patient received in bed, agrees to PT session. He reports he is feeling pretty good. O2 sats on [place] at 95% at rest. Patient is mod independent with bed mobility. Sit to stand with min assist. Ambulated 10 feet in room with hand held assist. Patient is very unsteady with ambulation. O2 desaturated to 79% after ambulating short distance. Patient not aware of this, feels like he is always a little sob due to smoking and COPD. Patient will continue to benefit from skilled PT while here to improve activity tolerance, strength and safety with mobility.       Follow Up Recommendations  SNF     Equipment Recommendations  Rolling walker with 5" wheels    Recommendations for Other Services       Precautions / Restrictions Precautions Precautions: Fall Restrictions Weight Bearing Restrictions: No    Mobility  Bed Mobility Overal bed mobility: Modified Independent Bed Mobility: Supine to Sit     Supine to sit: Modified independent (Device/Increase time)        Transfers Overall transfer level: Needs assistance Equipment used: 1 person hand held assist Transfers: Sit to/from Stand Sit to Stand: Min assist         General transfer comment: patient able to stand with min assist. Able to stand without UE support to use urinal.  Ambulation/Gait Ambulation/Gait assistance: Min assist Gait Distance (Feet): 10 Feet Assistive device: 1 person hand held assist Gait Pattern/deviations: Step-to pattern;Decreased stride length;Staggering right;Staggering left Gait velocity: decr   General Gait Details: Patient able  to ambulate 10 feet in room. Very unsteady, would benefit from RW, but was not using prior to admission. Patient HR remianed in normal limits, but O2 sats on 2-3 liters dropped into 70s. Recovering back to 80s with seated rest.   Stairs             Wheelchair Mobility    Modified Rankin (Stroke Patients Only)       Balance Overall balance assessment: Needs assistance Sitting-balance support: Feet supported Sitting balance-Leahy Scale: Good     Standing balance support: Single extremity supported;During functional activity Standing balance-Leahy Scale: Poor Standing balance comment: needs rw at this time for safety                            Cognition Arousal/Alertness: Awake/alert Behavior During Therapy: WFL for tasks assessed/performed Overall Cognitive Status: Within Functional Limits for tasks assessed                                        Exercises Other Exercises Other Exercises: B LAQ in sitting x 10 reps    General Comments        Pertinent Vitals/Pain Pain Assessment: No/denies pain    Home Living                      Prior Function  PT Goals (current goals can now be found in the care plan section) Acute Rehab PT Goals Patient Stated Goal: to get stronger PT Goal Formulation: With patient Time For Goal Achievement: 12/02/20 Potential to Achieve Goals: Good Progress towards PT goals: Progressing toward goals    Frequency    Min 2X/week      PT Plan Current plan remains appropriate    Co-evaluation              AM-PAC PT "6 Clicks" Mobility   Outcome Measure  Help needed turning from your back to your side while in a flat bed without using bedrails?: None Help needed moving from lying on your back to sitting on the side of a flat bed without using bedrails?: None Help needed moving to and from a bed to a chair (including a wheelchair)?: A Little Help needed standing up from a  chair using your arms (e.g., wheelchair or bedside chair)?: A Little Help needed to walk in hospital room?: A Lot Help needed climbing 3-5 steps with a railing? : A Lot 6 Click Score: 18    End of Session Equipment Utilized During Treatment: Gait belt;Oxygen Activity Tolerance: Treatment limited secondary to medical complications (Comment);Other (comment) (patient quickly desaturates with mobility. Patient with decreased awareness of this.) Patient left: in chair;with call bell/phone within reach Nurse Communication: Mobility status PT Visit Diagnosis: Other abnormalities of gait and mobility (R26.89);Muscle weakness (generalized) (M62.81);Difficulty in walking, not elsewhere classified (R26.2);History of falling (Z91.81);Unsteadiness on feet (R26.81)     Time: 5631-4970 PT Time Calculation (min) (ACUTE ONLY): 21 min  Charges:  $Therapeutic Activity: 8-22 mins                     Pharoah Goggins, PT, GCS 11/20/20,11:57 AM

## 2020-11-20 NOTE — Care Management Important Message (Signed)
Important Message  Patient Details  Name: Michael Padilla MRN: [id number] Date of Birth: 1955/11/27   Medicare Important Message Given:  Yes  Reviewed with patient via room phone due to isolation status.  Copy of Medicare IM to be delivered to patient via nursing staff.     [name] 11/20/2020, 1:16 PM

## 2020-11-20 NOTE — NC FL2 (Signed)
[place] MEDICAID FL2 LEVEL OF CARE SCREENING TOOL     IDENTIFICATION  Patient Name: Michael Padilla Birthdate: 1956-08-26 Sex: male Admission Date (Current Location): 11/17/2020  [place] and [place] Number:  Chiropodist and Address:  [facility], [address] [place], [place] [address]      Provider Number: [id number]  Attending Physician Name and Address:  Marrion Coy, MD  Relative Name and Phone Number:       Current Level of Care: Hospital Recommended Level of Care: Skilled Nursing Facility Prior Approval Number:    Date Approved/Denied:   PASRR Number: [id number]  Discharge Plan: SNF    Current Diagnoses: Patient Active Problem List   Diagnosis Date Noted  . Acute hypoxemic respiratory failure due to COVID-19 (HCC) 11/19/2020  . Gastroenteritis due to COVID-19 virus 11/17/2020  . Pneumonia due to COVID-19 virus 11/17/2020  . Hyponatremia 11/17/2020  . Generalized weakness 11/17/2020  . COPD with acute exacerbation (HCC) 11/17/2020  . SOB (shortness of breath) 01/16/2016    Orientation RESPIRATION BLADDER Height & Weight     Self,Time,Situation,Place  O2 ([place] 2L) Continent Weight: 140 lb (63.5 kg) Height:  5\' 8"  (172.7 cm)  BEHAVIORAL SYMPTOMS/MOOD NEUROLOGICAL BOWEL NUTRITION STATUS      Continent Diet (regular diet, liquid restriction )  AMBULATORY STATUS COMMUNICATION OF NEEDS Skin   Limited Assist Verbally Normal                       Personal Care Assistance Level of Assistance  Dressing,Feeding,Bathing Bathing Assistance: Limited assistance Feeding assistance: Independent Dressing Assistance: Limited assistance     Functional Limitations Info  Sight,Hearing,Speech Sight Info: Adequate Hearing Info: Adequate Speech Info: Adequate    SPECIAL CARE FACTORS FREQUENCY  PT (By licensed PT),OT (By licensed OT)     PT Frequency: 5x OT Frequency: 5x            Contractures Contractures  Info: Not present    Additional Factors Info  Code Status,Allergies Code Status Info: full code Allergies Info: no known allergies           Current Medications (11/20/2020):  This is the current hospital active medication list Current Facility-Administered Medications  Medication Dose Route Frequency Provider Last Rate Last Admin  . acetaminophen (TYLENOL) tablet 650 mg  650 mg Oral Q6H PRN 11/22/2020 R, MD      . albuterol (VENTOLIN HFA) 108 (90 Base) MCG/ACT inhaler 2 puff  2 puff Inhalation Q4H PRN Darreld Mclean, MD   2 puff at 11/18/20 0904  . ascorbic acid (VITAMIN C) tablet 500 mg  500 mg Oral Daily 11/20/20 R, MD   500 mg at 11/20/20 11/22/20  . chlorpheniramine-HYDROcodone (TUSSIONEX) 10-8 MG/5ML suspension 5 mL  5 mL Oral Q12H PRN 6195 R, MD      . enoxaparin (LOVENOX) injection 40 mg  40 mg Subcutaneous Q24H Darreld Mclean, MD   40 mg at 11/19/20 2026  . guaiFENesin-dextromethorphan (ROBITUSSIN DM) 100-10 MG/5ML syrup 10 mL  10 mL Oral Q4H PRN 2027 R, MD      . Ipratropium-Albuterol (COMBIVENT) respimat 1 puff  1 puff Inhalation Q6H Darreld Mclean, MD   1 puff at 11/20/20 1158  . methylPREDNISolone sodium succinate (SOLU-MEDROL) 40 mg/mL injection 40 mg  40 mg Intravenous Q12H 11/22/20, MD   40 mg at 11/20/20 0752  . nicotine (NICODERM CQ - dosed in mg/24 hours) patch 21  mg  21 mg Transdermal Daily Charlsie Quest, MD   21 mg at 11/20/20 0753  . ondansetron (ZOFRAN) tablet 4 mg  4 mg Oral Q6H PRN Charlsie Quest, MD       Or  . ondansetron (ZOFRAN) injection 4 mg  4 mg Intravenous Q6H PRN Charlsie Quest, MD      . remdesivir 100 mg in sodium chloride 0.9 % 100 mL IVPB  100 mg Intravenous Daily Darreld Mclean R, MD 200 mL/hr at 11/20/20 0752 100 mg at 11/20/20 0752  . zinc sulfate capsule 220 mg  220 mg Oral Daily Charlsie Quest, MD   220 mg at 11/20/20 0141     Discharge Medications: Please see discharge summary for a list of discharge  medications.  Relevant Imaging Results:  Relevant Lab Results:   Additional Information SSN:[id number]  Reuel Boom [place] Held, LCSW

## 2020-11-20 NOTE — Progress Notes (Signed)
PROGRESS NOTE    ROSSER Padilla  ZHY:[id number] DOB: 07-May-1956 DOA: 11/17/2020 PCP: Patient, No Pcp Per   Chief complaint of shortness of breath. Brief Narrative:  Michael L Braxtonis a 65 y.o.malewith medical history significant forCOPDand tobacco usewho presents to the ED for evaluation of generalized weakness and diarrhea.Sodium level was 117, bicarb 19. He was giving IV fluids for dehydration. Patient was also positive for Covid. Currently on 2 L oxygen, he is on remdesivir and steroids.   Assessment & Plan:   Principal Problem:   Gastroenteritis due to COVID-19 virus Active Problems:   Pneumonia due to COVID-19 virus   Hyponatremia   Generalized weakness   COPD with acute exacerbation (HCC)   Acute hypoxemic respiratory failure due to COVID-19 (HCC)  #1.  Hyponatremia secondary to dehydration. Hypokalemia and hypophosphatemia. COVID-19 pneumonia. Viral gastroenteritis secondary to Covid. Hypoxemia secondary to Covid infection. Patient condition initially improving.  Sodium level close to normal.  Patient does not feel any short of breath, currently on 2 L oxygen.  Patient states that the his oxygen saturation at home was always below 88%.  We will try to wean oxygen off. Patient BNP 177, will give a dose of oral Lasix.  #2.  COPD exacerbation. Continue 1 more day of Solu-Medrol.  DVT prophylaxis: Lovenox Code Status: Full Family Communication: None Disposition Plan:  .   Status is: Inpatient  Remains inpatient appropriate because:Inpatient level of care appropriate due to severity of illness   Dispo: The patient is from: Home              Anticipated d/c is to: Home              Anticipated d/c date is: 1 day              Patient currently is not medically stable to d/c.   Difficult to place patient No        I/O last 3 completed shifts: In: 660 [P.O.:560; IV Piggyback:100] Out: 900 [Urine:900] No intake/output data recorded.      Consultants:   None  Procedures: None  Antimicrobials: None  Subjective: Patient feels well today, he states that he does not feel short of breath.  His oxygen saturation was always 88% and below at the baseline, will try to wean oxygen off. No cough. No chest pain or palpitation. No fever chills  Objective: Vitals:   11/20/20 0044 11/20/20 0315 11/20/20 0331 11/20/20 0749  BP: 124/90  (!) 131/93 (!) 129/95  Pulse: 95  100 87  Resp: 18  20 19   Temp: (!) 97.5 F (36.4 C)  98 F (36.7 C) 97.8 F (36.6 C)  TempSrc:   Oral Oral  SpO2: 100% (!) 86% 92% 98%  Weight:      Height:        Intake/Output Summary (Last 24 hours) at 11/20/2020 1233 Last data filed at 11/20/2020 0600 Gross per 24 hour  Intake 220 ml  Output 400 ml  Net -180 ml   Filed Weights   11/17/20 1923  Weight: 63.5 kg    Examination:  General exam: Appears calm and comfortable  Respiratory system: Decreased breathing sounds without crackles. Respiratory effort normal. Cardiovascular system: S1 & S2 heard, RRR. No JVD, murmurs, rubs, gallops or clicks. No pedal edema. Gastrointestinal system: Abdomen is nondistended, soft and nontender. No organomegaly or masses felt. Normal bowel sounds heard. Central nervous system: Alert and oriented. No focal neurological deficits. Extremities: Symmetric Skin: No rashes, lesions  or ulcers Psychiatry: Judgement and insight appear normal. Mood & affect appropriate.     Data Reviewed: I have personally reviewed following labs and imaging studies  CBC: Recent Labs  Lab 11/17/20 1931 11/18/20 0455 11/19/20 0558 11/20/20 0543  WBC 6.5 5.0 12.0* 10.6*  NEUTROABS 5.1 4.4 10.4* 9.2*  HGB 17.4* 16.9 15.1 14.7  HCT 46.4 46.4 41.4 41.7  MCV 87.5 89.1 89.4 90.7  PLT 361 325 397 442*   Basic Metabolic Panel: Recent Labs  Lab 11/17/20 1931 11/17/20 2221 11/18/20 0455 11/19/20 0558 11/20/20 0543  NA 117* 118* 121* 127* 132*  K 3.5 3.7 3.6 3.2* 3.5  CL  83* 82* 84* 92* 96*  CO2 19* 22 26 26 28   GLUCOSE 129* 112* 140* 144* 149*  BUN 7* 7* 7* 12 12  CREATININE 0.57* 0.48* 0.43* 0.45* 0.55*  CALCIUM 8.0* 8.0* 7.8* 7.9* 7.6*  MG  --   --  2.3 2.3 2.3  PHOS  --   --  2.6 2.1* 2.8   GFR: Estimated Creatinine Clearance: 82.7 mL/min (A) (by C-G formula based on SCr of 0.55 mg/dL (L)). Liver Function Tests: Recent Labs  Lab 11/17/20 1931 11/18/20 0455 11/19/20 0558 11/20/20 0543  AST 62* 46* 33 32  ALT 34 30 25 24   ALKPHOS 65 59 53 54  BILITOT 1.2 0.7 0.7 0.8  PROT 6.8 6.1* 5.6* 5.5*  ALBUMIN 3.3* 2.9* 2.7* 2.6*   No results for input(s): LIPASE, AMYLASE in the last 168 hours. No results for input(s): AMMONIA in the last 168 hours. Coagulation Profile: Recent Labs  Lab 11/17/20 2006  INR 1.0   Cardiac Enzymes: Recent Labs  Lab 11/17/20 2221  CKTOTAL 167   BNP (last 3 results) No results for input(s): PROBNP in the last 8760 hours. HbA1C: No results for input(s): HGBA1C in the last 72 hours. CBG: No results for input(s): GLUCAP in the last 168 hours. Lipid Profile: No results for input(s): CHOL, HDL, LDLCALC, TRIG, CHOLHDL, LDLDIRECT in the last 72 hours. Thyroid Function Tests: No results for input(s): TSH, T4TOTAL, FREET4, T3FREE, THYROIDAB in the last 72 hours. Anemia Panel: Recent Labs    11/19/20 0558 11/20/20 0543  FERRITIN 486* 429*   Sepsis Labs: Recent Labs  Lab 11/17/20 1948 11/17/20 2221 11/18/20 0620  PROCALCITON  --  0.12  --   LATICACIDVEN 2.3*  --  1.3    Recent Results (from the past 240 hour(s))  Blood culture (routine single)     Status: None (Preliminary result)   Collection Time: 11/17/20  8:06 PM   Specimen: BLOOD  Result Value Ref Range Status   Specimen Description BLOOD LEFT ANTECUBITAL  Final   Special Requests   Final    BOTTLES DRAWN AEROBIC AND ANAEROBIC Blood Culture adequate volume   Culture   Final    NO GROWTH 3 DAYS Performed at [facility], [address], [place], [address]    Report Status PENDING  Incomplete         Radiology Studies: DG Chest 1 View  Result Date: 11/20/2020 CLINICAL DATA:  Acute hypoxemic respiratory failure due to COVID-19, weakness, shortness of breath, COPD EXAM: CHEST  1 VIEW COMPARISON:  Portable exam 0808 hours compared to 11/17/2020 FINDINGS: Normal heart size, mediastinal contours, and pulmonary vascularity. Atherosclerotic calcification aorta. Emphysematous and bronchitic changes consistent with COPD. Increased bibasilar opacities consistent with pneumonia No pleural effusion or pneumothorax. Bones demineralized. IMPRESSION: COPD changes with increased bibasilar opacities consistent with  pneumonia. Electronically Signed   By: Ulyses Southward M.D.   On: 11/20/2020 08:25        Scheduled Meds: . vitamin C  500 mg Oral Daily  . enoxaparin (LOVENOX) injection  40 mg Subcutaneous Q24H  . Ipratropium-Albuterol  1 puff Inhalation Q6H  . methylPREDNISolone (SOLU-MEDROL) injection  40 mg Intravenous Q12H  . nicotine  21 mg Transdermal Daily  . zinc sulfate  220 mg Oral Daily   Continuous Infusions: . remdesivir 100 mg in NS 100 mL 100 mg (11/20/20 0752)     LOS: 3 days    Time spent: 28 minutes    Marrion Coy, MD [facility]   To contact the attending provider between 7A-7P or the covering provider during after hours 7P-7A, please log into the web site [email or web address] and access using universal [place] password for that web site. If you do not have the password, please call the hospital operator.  11/20/2020, 12:33 PM

## 2020-11-21 LAB — COMPREHENSIVE METABOLIC PANEL
ALT: 25 U/L (ref 0–44)
AST: 34 U/L (ref 15–41)
Albumin: 2.8 g/dL — ABNORMAL LOW (ref 3.5–5.0)
Alkaline Phosphatase: 64 U/L (ref 38–126)
Anion gap: 12 (ref 5–15)
BUN: 12 mg/dL (ref 8–23)
CO2: 27 mmol/L (ref 22–32)
Calcium: 7.8 mg/dL — ABNORMAL LOW (ref 8.9–10.3)
Chloride: 94 mmol/L — ABNORMAL LOW (ref 98–111)
Creatinine, Ser: 0.54 mg/dL — ABNORMAL LOW (ref 0.61–1.24)
GFR, Estimated: >60 mL/min (ref >60)
Glucose, Bld: 138 mg/dL — ABNORMAL HIGH (ref 70–99)
Potassium: 2.9 mmol/L — ABNORMAL LOW (ref 3.5–5.1)
Sodium: 133 mmol/L — ABNORMAL LOW (ref 135–145)
Total Bilirubin: 0.8 mg/dL (ref 0.3–1.2)
Total Protein: 5.7 g/dL — ABNORMAL LOW (ref 6.5–8.1)

## 2020-11-21 LAB — CBC WITH DIFFERENTIAL/PLATELET
Abs Immature Granulocytes: 0.07 10*3/uL (ref 0.00–0.07)
Basophils Absolute: 0 10*3/uL (ref 0.0–0.1)
Basophils Relative: 0 %
Eosinophils Absolute: 0 10*3/uL (ref 0.0–0.5)
Eosinophils Relative: 0 %
HCT: 43.8 % (ref 39.0–52.0)
Hemoglobin: 16 g/dL (ref 13.0–17.0)
Immature Granulocytes: 1 %
Lymphocytes Relative: 5 %
Lymphs Abs: 0.4 10*3/uL — ABNORMAL LOW (ref 0.7–4.0)
MCH: 32.7 pg (ref 26.0–34.0)
MCHC: 36.5 g/dL — ABNORMAL HIGH (ref 30.0–36.0)
MCV: 89.4 fL (ref 80.0–100.0)
Monocytes Absolute: 0.8 10*3/uL (ref 0.1–1.0)
Monocytes Relative: 10 %
Neutro Abs: 7 10*3/uL (ref 1.7–7.7)
Neutrophils Relative %: 84 %
Platelets: 427 10*3/uL — ABNORMAL HIGH (ref 150–400)
RBC: 4.9 MIL/uL (ref 4.22–5.81)
RDW: 13.5 % (ref 11.5–15.5)
WBC: 8.3 10*3/uL (ref 4.0–10.5)
nRBC: 0 % (ref 0.0–0.2)

## 2020-11-21 LAB — MAGNESIUM: Magnesium: 2.2 mg/dL (ref 1.7–2.4)

## 2020-11-21 LAB — FERRITIN: Ferritin: 364 ng/mL — ABNORMAL HIGH (ref 24–336)

## 2020-11-21 LAB — PHOSPHORUS: Phosphorus: 2.2 mg/dL — ABNORMAL LOW (ref 2.5–4.6)

## 2020-11-21 LAB — FIBRIN DERIVATIVES D-DIMER (ARMC ONLY): Fibrin derivatives D-dimer (ARMC): 518.36 ng/mL (FEU) — ABNORMAL HIGH (ref 0.00–499.00)

## 2020-11-21 LAB — C-REACTIVE PROTEIN: CRP: 1.5 mg/dL — ABNORMAL HIGH (ref <1.0)

## 2020-11-21 MED ORDER — IPRATROPIUM-ALBUTEROL 20-100 MCG/ACT IN AERS: 1.0000 | INHALATION_SPRAY | Freq: Four times a day (QID) | RESPIRATORY_TRACT | 0 refills | Status: DC | PRN

## 2020-11-21 MED ORDER — TRELEGY ELLIPTA 100-62.5-25 MCG/INH IN AEPB: 1.0000 | INHALATION_SPRAY | Freq: Every day | RESPIRATORY_TRACT | 0 refills | Status: DC

## 2020-11-21 MED ORDER — ASCORBIC ACID 500 MG PO TABS: 500.0000 mg | ORAL_TABLET | Freq: Every day | ORAL | 0 refills | Status: AC

## 2020-11-21 MED ORDER — PREDNISONE 10 MG PO TABS: ORAL_TABLET | ORAL | 0 refills | Status: AC

## 2020-11-21 MED ORDER — ZINC SULFATE 220 (50 ZN) MG PO CAPS: 220.0000 mg | ORAL_CAPSULE | Freq: Every day | ORAL | 0 refills | Status: AC

## 2020-11-21 MED ORDER — POTASSIUM CHLORIDE 10 MEQ/100ML IV SOLN
10.0000 meq | INTRAVENOUS | Status: AC
  Administered 2020-11-21 (×4): 10 meq via INTRAVENOUS
  Filled 2020-11-21: qty 100

## 2020-11-21 NOTE — Discharge Summary (Addendum)
Physician Discharge Summary  Patient ID: Michael Padilla MRN: [id number] DOB/AGE: 65-16-1957 65 y.o.  Admit date: 11/17/2020 Discharge date: 11/21/2020  Admission Diagnoses:  Discharge Diagnoses:  Principal Problem:   Gastroenteritis due to COVID-19 virus Active Problems:   Pneumonia due to COVID-19 virus   Hyponatremia   Generalized weakness   COPD with acute exacerbation (HCC)   Acute hypoxemic respiratory failure due to COVID-19 [facility])   Discharged Condition: good  Hospital Course:  Bladyn L Braxtonis a 65 y.o.malewith medical history significant forCOPDand tobacco usewho presents to the ED for evaluation of generalized weakness and diarrhea.Sodium level was 117, bicarb 19. He was giving IV fluids for dehydration. Patient was also positive for Covid. Currently on 2 L oxygen, he is on remdesivir and steroids.  #1. Hyponatremia secondary to dehydration. Hypokalemia and hypophosphatemia. COVID-19 pneumonia. Viral gastroenteritis secondary to Covid. Acute on chronic hypoxemic respiratory failure secondary to Covid infection. Patient condition initially improving.  Sodium level close to normal.  Patient does not feel short of breath today.  He is currently on 1 L oxygen.  Anticipating need long-term oxygen as his baseline oxygen saturation was low. Patient BNP 177, he was giving a dose of Lasix. Condition had improved, he is medically stable to be discharged.  We obtain home oxygen evaluation. Evaluated by physical therapy and Occupational Therapy, recommended SNF placement.  But the patient refused it.  he will be discharged home with home physical therapy and Occupational Therapy  #2.  COPD exacerbation. Received Solu-Medrol.  Condition has improved.  No bronchospasm.  Continue steroid taper.  Also added inhalers for home use. Schedule patient to be seen by pulmonology in 2 or 3 weeks.  #3.  Hypokalemia. We will supplement before discharge.  Patient does not  want to leave today. We will continue to talk about nursing home. Cancel discharge.  Consults: None  Significant Diagnostic Studies:  CHEST  1 VIEW  COMPARISON:  Portable exam 0808 hours compared to 11/17/2020  FINDINGS: Normal heart size, mediastinal contours, and pulmonary vascularity.  Atherosclerotic calcification aorta.  Emphysematous and bronchitic changes consistent with COPD.  Increased bibasilar opacities consistent with pneumonia  No pleural effusion or pneumothorax.  Bones demineralized.  IMPRESSION: COPD changes with increased bibasilar opacities consistent with pneumonia.   Electronically Signed   By: Ulyses Southward M.D.   On: 11/20/2020 08:25    Treatments: Steroids, remdesivir.  Discharge Exam: Blood pressure (!) 151/103, pulse (!) 107, temperature 97.7 F (36.5 C), resp. rate 20, height 5\' 8"  (1.727 m), weight 63.5 kg, SpO2 90 %. General appearance: alert and cooperative Resp: Decreased breath breathing sounds without crackles or wheezes. Cardio: regular rate and rhythm, S1, S2 normal, no murmur, click, rub or gallop GI: soft, non-tender; bowel sounds normal; no masses,  no organomegaly Extremities: extremities normal, atraumatic, no cyanosis or edema  Disposition: Discharge disposition: 01-Home or Self Care       Discharge Instructions    Diet - low sodium heart healthy   Complete by: As directed    Increase activity slowly   Complete by: As directed      Allergies as of 11/21/2020   No Known Allergies     Medication List    TAKE these medications   ascorbic acid 500 MG tablet Commonly known as: VITAMIN C Take 1 tablet (500 mg total) by mouth daily for 14 days. Start taking on: November 22, 2020   Ipratropium-Albuterol 20-100 MCG/ACT Aers respimat Commonly known as: COMBIVENT Inhale 1 puff into  the lungs every 6 (six) hours as needed for wheezing.   predniSONE 10 MG tablet Commonly known as: DELTASONE Take 4 tablets (40  mg total) by mouth daily for 6 days, THEN 2 tablets (20 mg total) daily for 2 days, THEN 1 tablet (10 mg total) daily for 2 days. Start taking on: November 21, 2020   Trelegy Ellipta 100-62.5-25 MCG/INH Aepb Generic drug: Fluticasone-Umeclidin-Vilant Inhale 1 puff into the lungs daily.   zinc sulfate 220 (50 Zn) MG capsule Take 1 capsule (220 mg total) by mouth daily for 14 days. Start taking on: November 22, 2020            Durable Medical Equipment  (From admission, onward)         Start     Ordered   11/21/20 0929  For home use only DME oxygen  Once       Question Answer Comment  Length of Need 6 Months   Mode or (Route) Nasal cannula   Liters per Minute 2   Frequency Continuous (stationary and portable oxygen unit needed)   Oxygen conserving device Yes   Oxygen delivery system Gas      11/21/20 0928          Follow-up Information    Vida Rigger, MD Follow up in 2 week(s).   Specialty: Pulmonary Disease Contact information: [address] [place] [address] [phone]              34 minutes Signed: Marrion Coy 11/21/2020, 9:39 AM

## 2020-11-21 NOTE — Treatment Plan (Signed)
SaO2 on room air at rest: 85 SaO2 on room air while ambulating = 79 SaO2 on 3 liters of O2 while ambulating = 89%  Pt walked 50 feet at this time.

## 2020-11-21 NOTE — Discharge Instructions (Signed)

## 2020-11-21 NOTE — Plan of Care (Addendum)
  Problem: Education: Goal: Knowledge of General Education information will improve Description: Including pain rating scale, medication(s)/side effects and non-pharmacologic comfort measures Outcome: Progressing   Problem: Health Behavior/Discharge Planning: Goal: Ability to manage health-related needs will improve Outcome: Progressing   Problem: Clinical Measurements: Goal: Ability to maintain clinical measurements within normal limits will improve Outcome: Progressing Goal: Will remain free from infection Outcome: Progressing Goal: Diagnostic test results will improve Outcome: Progressing Goal: Respiratory complications will improve Outcome: Progressing   Problem: Activity: Goal: Risk for activity intolerance will decrease Outcome: Progressing   Problem: Safety: Goal: Ability to remain free from injury will improve Outcome: Progressing   Pt still on oxygen at 1lpm/[facility] with oxygen saturation at 93%; desats 86% with activity. Denies SOB.

## 2020-11-21 NOTE — Care Plan (Signed)
Tele called and stated to writer that oxygen was 68. Writer enters room, pt not in distress and manually oxygen with dinamap was 91% on 2 L at this time. Pt sitting in bed watching tv eating dinner. Tele called and made aware.

## 2020-11-21 NOTE — TOC Progression Note (Addendum)
Transition of Care [facility]) - Progression Note    Patient Details  Name: Michael Padilla MRN: [id number] Date of Birth: April 23, 1956  Transition of Care [facility]) CM/SW Contact  Bing Quarry, RN Phone Number: 11/21/2020, 12:19 PM  Clinical Narrative:    11/21/20 1230 Pt. Feels "I can go home instead of a SNF". Patient has no PCP.   Spoke with son and he works 12 hours a day 7 days week for the most part and is very concerned that is father is too weak to be at home that long with no one home, even with PT/OT/RN. He said he father stated on the phone this am that he was just too tired and wanted to be in bed. Son also stated patient did not recognize other son's voice over the phone.   Spoke with patient to explain the different types of post discharge care for him, differentiating between rehab SNF, long term care, and [facility] services. CM was not sure patient understood everything as he tried to be agreeable but told son he wanted to come home not SNF. Patient admitted he had not ambulated more than 10 feet without assistance from PT and oxygen. He also said he has not been getting up to go to bathroom without assistance or using a urinal in bed.   PT noted on 11/20/20 that he desaturated with ambulation with oxygen on: "Ambulated 10 feet in room with hand held assist. Patient is very unsteady with ambulation. O2 desaturated to 79% after ambulating short distance" on 11/20/20.   SNF search has been initiated on 11/20/20. No HH agency acceptance yet pending decision, but at this time discharge to home with [facility] PT/OT/RN does not appear a safe discharge option. Communicated with PT, RN, and provider involved with patient. Will follow up with son as for now SNF search will continue. TOC will continue to monitor and follow for safe discharge planning. Gabriel Cirri RN CM  Called son again to update on decision to discontinue discharge and continue SNF search pending patient condition progressing. Gabriel Cirri RN  CM         Expected Discharge Plan and Services           Expected Discharge Date: 11/21/20                                     Social Determinants of Health (SDOH) Interventions    Readmission Risk Interventions No flowsheet data found.

## 2020-11-22 LAB — CBC WITH DIFFERENTIAL/PLATELET
Abs Immature Granulocytes: 0.05 10*3/uL (ref 0.00–0.07)
Basophils Absolute: 0 10*3/uL (ref 0.0–0.1)
Basophils Relative: 0 %
Eosinophils Absolute: 0 10*3/uL (ref 0.0–0.5)
Eosinophils Relative: 0 %
HCT: 41.7 % (ref 39.0–52.0)
Hemoglobin: 15.3 g/dL (ref 13.0–17.0)
Immature Granulocytes: 1 %
Lymphocytes Relative: 7 %
Lymphs Abs: 0.4 10*3/uL — ABNORMAL LOW (ref 0.7–4.0)
MCH: 33 pg (ref 26.0–34.0)
MCHC: 36.7 g/dL — ABNORMAL HIGH (ref 30.0–36.0)
MCV: 90.1 fL (ref 80.0–100.0)
Monocytes Absolute: 0.7 10*3/uL (ref 0.1–1.0)
Monocytes Relative: 11 %
Neutro Abs: 4.9 10*3/uL (ref 1.7–7.7)
Neutrophils Relative %: 81 %
Platelets: 340 10*3/uL (ref 150–400)
RBC: 4.63 MIL/uL (ref 4.22–5.81)
RDW: 13.6 % (ref 11.5–15.5)
WBC: 6 10*3/uL (ref 4.0–10.5)
nRBC: 0 % (ref 0.0–0.2)

## 2020-11-22 LAB — CULTURE, BLOOD (SINGLE)
Culture: NO GROWTH
Special Requests: ADEQUATE

## 2020-11-22 LAB — COMPREHENSIVE METABOLIC PANEL
ALT: 24 U/L (ref 0–44)
AST: 26 U/L (ref 15–41)
Albumin: 2.7 g/dL — ABNORMAL LOW (ref 3.5–5.0)
Alkaline Phosphatase: 51 U/L (ref 38–126)
Anion gap: 9 (ref 5–15)
BUN: 10 mg/dL (ref 8–23)
CO2: 27 mmol/L (ref 22–32)
Calcium: 7.6 mg/dL — ABNORMAL LOW (ref 8.9–10.3)
Chloride: 93 mmol/L — ABNORMAL LOW (ref 98–111)
Creatinine, Ser: <0.3 mg/dL — ABNORMAL LOW (ref 0.61–1.24)
Glucose, Bld: 116 mg/dL — ABNORMAL HIGH (ref 70–99)
Potassium: 3.5 mmol/L (ref 3.5–5.1)
Sodium: 129 mmol/L — ABNORMAL LOW (ref 135–145)
Total Bilirubin: 1 mg/dL (ref 0.3–1.2)
Total Protein: 5.3 g/dL — ABNORMAL LOW (ref 6.5–8.1)

## 2020-11-22 LAB — FERRITIN: Ferritin: 372 ng/mL — ABNORMAL HIGH (ref 24–336)

## 2020-11-22 LAB — FIBRIN DERIVATIVES D-DIMER (ARMC ONLY): Fibrin derivatives D-dimer (ARMC): 577.98 ng/mL (FEU) — ABNORMAL HIGH (ref 0.00–499.00)

## 2020-11-22 LAB — PHOSPHORUS: Phosphorus: 2.6 mg/dL (ref 2.5–4.6)

## 2020-11-22 LAB — C-REACTIVE PROTEIN: CRP: 0.8 mg/dL (ref <1.0)

## 2020-11-22 LAB — MAGNESIUM: Magnesium: 2.1 mg/dL (ref 1.7–2.4)

## 2020-11-22 MED ORDER — LABETALOL HCL 5 MG/ML IV SOLN
20.0000 mg | INTRAVENOUS | Status: DC | PRN
  Administered 2020-11-22 – 2020-11-24 (×2): 20 mg via INTRAVENOUS
  Filled 2020-11-22 (×2): qty 4

## 2020-11-22 MED ORDER — POTASSIUM CHLORIDE 20 MEQ PO PACK
40.0000 meq | PACK | Freq: Once | ORAL | Status: AC
  Administered 2020-11-22: 40 meq via ORAL
  Filled 2020-11-22: qty 2

## 2020-11-22 MED ORDER — SODIUM CHLORIDE 1 G PO TABS
1.0000 g | ORAL_TABLET | Freq: Every day | ORAL | Status: DC
  Administered 2020-11-22 – 2020-11-23 (×2): 1 g via ORAL
  Filled 2020-11-22 (×2): qty 1

## 2020-11-22 NOTE — Progress Notes (Signed)
PROGRESS NOTE    Michael Padilla  FKC:[id number] DOB: 1956/04/09 DOA: 11/17/2020 PCP: Patient, No Pcp Per   Chief Complaint.  Shortness of breath. Brief Narrative:  Michael Padilla a 65 y.o.malewith medical history significant forCOPDand tobacco usewho presents to the ED for evaluation of generalized weakness and diarrhea.Sodium level was 117, bicarb 19. He was giving IV fluids for dehydration. Patient was also positive for Covid. He has completed remdesivir.  Still on oral steroids. He is medically stable to be discharged, currently pending nursing placement.  Assessment & Plan:   Principal Problem:   Gastroenteritis due to COVID-19 virus Active Problems:   Pneumonia due to COVID-19 virus   Hyponatremia   Generalized weakness   COPD with acute exacerbation (HCC)   Acute hypoxemic respiratory failure due to COVID-19 (HCC)  #1.  Hyponatremia secondary to dehydration. Hypokalemia and hypophosphatemia. Viral gastroenteritis secondary to Covid. Condition improved.  Sodium level still low, patient no longer seems to be dehydrated.  A component of SIADH.  Continue fluid restriction.  Also added sodium 1 g daily.  Recheck a BMP tomorrow while waiting for SNF placement. Potassium 3.5, will give another dose of oral potassium supplement.  2.  COVID-19 pneumonia. Acute on chronic hypoxemic respite failure secondary to Covid pneumonia. COPD exacerbation. Condition had improved.  Currently patient is on 1 L oxygen, continue oral steroids.  3.  Generalized weakness. Continue physical therapy/Occupational Therapy.  Pending nursing placement.     DVT prophylaxis: Lovenox Code Status: Full Family Communication:  Disposition Plan:  .   Status is: Inpatient  Remains inpatient appropriate because:Unsafe d/c plan   Dispo: The patient is from: Home              Anticipated d/c is to: SNF              Anticipated d/c date is: 2 days              Patient currently is  medically stable to d/c.   Difficult to place patient No        I/O last 3 completed shifts: In: 640 [P.O.:240; IV Piggyback:400] Out: 700 [Urine:700] Total I/O In: -  Out: 200 [Urine:200]     Consultants:   None  Procedures: None  Antimicrobials:None  Subjective: Patient doing well today, currently on 1 L oxygen.  He states that he was able to walk with the physical therapy yesterday. Cough chronic, nonproductive. No fever chills. No abdominal pain or nausea vomiting.  Denies any diarrhea constipation.  Objective: Vitals:   11/21/20 2103 11/22/20 0055 11/22/20 0426 11/22/20 0842  BP: (!) 130/95 (!) 146/98 (!) 150/97 (!) 152/95  Pulse: 92 89 93 84  Resp: 20 20 20 20   Temp: 97.6 F (36.4 C) (!) 97.4 F (36.3 C) 97.8 F (36.6 C) 97.7 F (36.5 C)  TempSrc: Oral Oral Oral Oral  SpO2: 97% 92% 93% 94%  Weight:      Height:        Intake/Output Summary (Last 24 hours) at 11/22/2020 0906 Last data filed at 11/22/2020 0800 Gross per 24 hour  Intake 640 ml  Output 200 ml  Net 440 ml   Filed Weights   11/17/20 1923  Weight: 63.5 kg    Examination:  General exam: Appears calm and comfortable  Respiratory system: Decreased breathing sounds without crackles or wheezes.11/19/20 Respiratory effort normal. Cardiovascular system: S1 & S2 heard, RRR. No JVD, murmurs, rubs, gallops or clicks. No pedal edema. Gastrointestinal system: Abdomen  is nondistended, soft and nontender. No organomegaly or masses felt. Normal bowel sounds heard. Central nervous system: Alert and oriented. No focal neurological deficits. Extremities: Symmetric 5 x 5 power. Skin: No rashes, lesions or ulcers Psychiatry:  Mood & affect appropriate.     Data Reviewed: I have personally reviewed following labs and imaging studies  CBC: Recent Labs  Lab 11/18/20 0455 11/19/20 0558 11/20/20 0543 11/21/20 0529 11/22/20 0605  WBC 5.0 12.0* 10.6* 8.3 6.0  NEUTROABS 4.4 10.4* 9.2* 7.0 4.9  HGB 16.9  15.1 14.7 16.0 15.3  HCT 46.4 41.4 41.7 43.8 41.7  MCV 89.1 89.4 90.7 89.4 90.1  PLT 325 397 442* 427* 340   Basic Metabolic Panel: Recent Labs  Lab 11/18/20 0455 11/19/20 0558 11/20/20 0543 11/21/20 0529 11/22/20 0605  NA 121* 127* 132* 133* 129*  K 3.6 3.2* 3.5 2.9* 3.5  CL 84* 92* 96* 94* 93*  CO2 26 26 28 27 27   GLUCOSE 140* 144* 149* 138* 116*  BUN 7* 12 12 12 10   CREATININE 0.43* 0.45* 0.55* 0.54* <0.30*  CALCIUM 7.8* 7.9* 7.6* 7.8* 7.6*  MG 2.3 2.3 2.3 2.2 2.1  PHOS 2.6 2.1* 2.8 2.2* 2.6   GFR: CrCl cannot be calculated (This lab value cannot be used to calculate CrCl because it is not a number: <0.30). Liver Function Tests: Recent Labs  Lab 11/18/20 0455 11/19/20 0558 11/20/20 0543 11/21/20 0529 11/22/20 0605  AST 46* 33 32 34 26  ALT 30 25 24 25 24   ALKPHOS 59 53 54 64 51  BILITOT 0.7 0.7 0.8 0.8 1.0  PROT 6.1* 5.6* 5.5* 5.7* 5.3*  ALBUMIN 2.9* 2.7* 2.6* 2.8* 2.7*   No results for input(s): LIPASE, AMYLASE in the last 168 hours. No results for input(s): AMMONIA in the last 168 hours. Coagulation Profile: Recent Labs  Lab 11/17/20 2006  INR 1.0   Cardiac Enzymes: Recent Labs  Lab 11/17/20 2221  CKTOTAL 167   BNP (last 3 results) No results for input(s): PROBNP in the last 8760 hours. HbA1C: No results for input(s): HGBA1C in the last 72 hours. CBG: No results for input(s): GLUCAP in the last 168 hours. Lipid Profile: No results for input(s): CHOL, HDL, LDLCALC, TRIG, CHOLHDL, LDLDIRECT in the last 72 hours. Thyroid Function Tests: No results for input(s): TSH, T4TOTAL, FREET4, T3FREE, THYROIDAB in the last 72 hours. Anemia Panel: Recent Labs    11/21/20 0529 11/22/20 0605  FERRITIN 364* 372*   Sepsis Labs: Recent Labs  Lab 11/17/20 1948 11/17/20 2221 11/18/20 0620  PROCALCITON  --  0.12  --   LATICACIDVEN 2.3*  --  1.3    Recent Results (from the past 240 hour(s))  Blood culture (routine single)     Status: None   Collection  Time: 11/17/20  8:06 PM   Specimen: BLOOD  Result Value Ref Range Status   Specimen Description BLOOD LEFT ANTECUBITAL  Final   Special Requests   Final    BOTTLES DRAWN AEROBIC AND ANAEROBIC Blood Culture adequate volume   Culture   Final    NO GROWTH 5 DAYS Performed at [facility], [address], [place], [facility] [address]    Report Status 11/22/2020 FINAL  Final         Radiology Studies: No results found.      Scheduled Meds: . vitamin C  500 mg Oral Daily  . enoxaparin (LOVENOX) injection  40 mg Subcutaneous Q24H  . Ipratropium-Albuterol  1 puff Inhalation Q6H  .  methylPREDNISolone (SOLU-MEDROL) injection  40 mg Intravenous Q12H  . nicotine  21 mg Transdermal Daily  . potassium chloride  40 mEq Oral Once  . sodium chloride  1 g Oral Daily  . zinc sulfate  220 mg Oral Daily   Continuous Infusions:   LOS: 5 days    Time spent: 28 minutes    Marrion Coy, MD [facility]   To contact the attending provider between 7A-7P or the covering provider during after hours 7P-7A, please log into the web site [email or web address] and access using universal [place] password for that web site. If you do not have the password, please call the hospital operator.  11/22/2020, 9:06 AM

## 2020-11-23 LAB — BASIC METABOLIC PANEL
Anion gap: 9 (ref 5–15)
BUN: 12 mg/dL (ref 8–23)
CO2: 24 mmol/L (ref 22–32)
Calcium: 7.9 mg/dL — ABNORMAL LOW (ref 8.9–10.3)
Chloride: 96 mmol/L — ABNORMAL LOW (ref 98–111)
Creatinine, Ser: 0.34 mg/dL — ABNORMAL LOW (ref 0.61–1.24)
GFR, Estimated: >60 mL/min (ref >60)
Glucose, Bld: 138 mg/dL — ABNORMAL HIGH (ref 70–99)
Potassium: 4.3 mmol/L (ref 3.5–5.1)
Sodium: 129 mmol/L — ABNORMAL LOW (ref 135–145)

## 2020-11-23 LAB — PHOSPHORUS: Phosphorus: 2.3 mg/dL — ABNORMAL LOW (ref 2.5–4.6)

## 2020-11-23 MED ORDER — METOPROLOL TARTRATE 25 MG PO TABS
25.0000 mg | ORAL_TABLET | Freq: Two times a day (BID) | ORAL | Status: DC
  Administered 2020-11-23 – 2020-11-24 (×2): 25 mg via ORAL
  Filled 2020-11-23 (×3): qty 1

## 2020-11-23 MED ORDER — PREDNISONE 20 MG PO TABS
50.0000 mg | ORAL_TABLET | Freq: Every day | ORAL | Status: DC
  Administered 2020-11-24 – 2020-11-25 (×2): 50 mg via ORAL
  Filled 2020-11-23 (×2): qty 3

## 2020-11-23 MED ORDER — QUETIAPINE FUMARATE 25 MG PO TABS
25.0000 mg | ORAL_TABLET | Freq: Every day | ORAL | Status: DC
  Administered 2020-11-23 – 2020-11-24 (×2): 25 mg via ORAL
  Filled 2020-11-23 (×2): qty 1

## 2020-11-23 MED ORDER — SODIUM CHLORIDE 1 G PO TABS
1.0000 g | ORAL_TABLET | Freq: Two times a day (BID) | ORAL | Status: DC
  Administered 2020-11-23 – 2020-11-25 (×5): 1 g via ORAL
  Filled 2020-11-23 (×6): qty 1

## 2020-11-23 MED ORDER — ENSURE ENLIVE PO LIQD
237.0000 mL | Freq: Two times a day (BID) | ORAL | Status: DC
  Administered 2020-11-23 – 2020-11-25 (×6): 237 mL via ORAL

## 2020-11-23 NOTE — Treatment Plan (Signed)
Pt tachy in the 120s at this time. MD states to hold and watch, no interventions at this time.

## 2020-11-23 NOTE — TOC Progression Note (Addendum)
Transition of Care [facility]) - Progression Note    Patient Details  Name: Michael Padilla MRN: [id number] Date of Birth: [birth year]  Transition of Care [facility]) CM/SW Contact  Maree Krabbe, LCSW Phone Number: 11/23/2020, 3:42 PM  Clinical Narrative:   At this time the only bed offer is [facility] in [place]. CSW called and explained this to pt and pt says "I can't go to [place]." CSW explained since he is refusing bed offer the next thing would be d/c home with home health--pt agreeable and wants CSW to call him son. CSW spoke with pt's son and pt is agreeable to pt returning home instead of going to [facility] in [place]. Pt's son states he will take a few days off to help get the pt settled in. Pt's son agreeable to home health--with no agency preference. Pt's son states pt has a walker at home.MD notified.  Pt's son agreeable to CSW finding pt a PCP. Pt has a "new pt/ hospital follow up" apt next week 2/8 at 11:00. CSW will provide this information to pt's son and add it to AVS. Plan is for d/c tomorrow. [facility] will service. Will need HH orders.       Expected Discharge Plan and Services           Expected Discharge Date: 11/21/20                                     Social Determinants of Health (SDOH) Interventions    Readmission Risk Interventions No flowsheet data found.

## 2020-11-23 NOTE — TOC Progression Note (Signed)
Transition of Care [facility]) - Progression Note    Patient Details  Name: Michael Padilla MRN: [id number] Date of Birth: Dec 31, 1955  Transition of Care [facility]) CM/SW Contact  Maree Krabbe, LCSW Phone Number: 11/23/2020, 1:27 PM  Clinical Narrative: faxed out further, no bed offers at this time.   Dc to SNF barriers are Covid and [employer].           Expected Discharge Plan and Services           Expected Discharge Date: 11/21/20                                     Social Determinants of Health (SDOH) Interventions    Readmission Risk Interventions No flowsheet data found.

## 2020-11-23 NOTE — Progress Notes (Signed)
PROGRESS NOTE    Michael Padilla  GYK:[id number] DOB: 1956-06-13 DOA: 11/17/2020 PCP: Patient, No Pcp Per   Chief complaint: shortness of breath Brief Narrative:  Michael L Braxtonis a 65 y.o.malewith medical history significant forCOPDand tobacco usewho presents to the ED for evaluation of generalized weakness and diarrhea.Sodium level was 117, bicarb 19. He was giving IV fluids for dehydration. Patient was also positive for Covid. He has completed remdesivir.  Still on oral steroids. He is medically stable to be discharged, currently pending nursing placement.   Assessment & Plan:   Principal Problem:   Gastroenteritis due to COVID-19 virus Active Problems:   Pneumonia due to COVID-19 virus   Hyponatremia   Generalized weakness   COPD with acute exacerbation (HCC)   Acute hypoxemic respiratory failure due to COVID-19 (HCC)  #1.  Hyponatremia secondary to dehydration. Hypokalemia and hypophosphatemia. Viral gastroenteritis secondary to Covid. Patient volume status improved.  Still has some hyponatremia.  Patient has SIADH.  Fluid restriction started.  Also added salt tablets.  Monitor BMP.   2.  COVID-19 pneumonia. Acute on chronic hypoxemic respiratory failure secondary to COVID-19 pneumonia COPD exacerbation. Condition improved.  Patient will need long-term oxygen as his baseline oxygen was less than 88% at home. He has completed remdesivir, change steroids to oral.  #3.  Generalized weakness. Possible underlying dementia. Patient is pending nursing placement.  He appeared to be confused in the early morning.  Spoke with the nurse, he does not sleep well at night, add Seroquel 25 mg every evening.  #4.  Moderate protein calorie malnutrition. Protein supplement.   DVT prophylaxis: Lovenox Code Status: Full Family Communication:  Disposition Plan:  .   Status is: Inpatient  Remains inpatient appropriate because:Unsafe d/c plan   Dispo: The patient is  from: Home              Anticipated d/c is to: SNF              Anticipated d/c date is: 2 days              Patient currently is medically stable to d/c.   Difficult to place patient Yes        I/O last 3 completed shifts: In: -  Out: 1100 [Urine:1100] No intake/output data recorded.     Consultants:   None  Procedures: None  Antimicrobials: None  Subjective: Patient did not sleep well last night, he has some confusion this morning. Short of breath has improved.  He has a cough, nonproductive. No fever or chills. No abdominal pain or nausea vomiting. No dysuria or hematuria.  Objective: Vitals:   11/22/20 2123 11/22/20 2308 11/23/20 0603 11/23/20 0836  BP: (!) 160/108 (!) 159/100 (!) 137/97 (!) 153/100  Pulse: 90 84 81 85  Resp: (!) 26 18 19 20   Temp: (!) 97.5 F (36.4 C) 97.6 F (36.4 C) 97.7 F (36.5 C) 97.7 F (36.5 C)  TempSrc: Oral  Oral Oral  SpO2: 95% 98% 94% 95%  Weight:      Height:        Intake/Output Summary (Last 24 hours) at 11/23/2020 0928 Last data filed at 11/23/2020 11/25/2020 Gross per 24 hour  Intake -  Output 900 ml  Net -900 ml   Filed Weights   11/17/20 1923  Weight: 63.5 kg    Examination:  General exam: Appears calm and comfortable, appear malnourished Respiratory system: Significant decreased breathing sounds without crackles or wheezes. Respiratory effort normal. Cardiovascular system:  S1 & S2 heard, RRR. No JVD, murmurs, rubs, gallops or clicks. No pedal edema. Gastrointestinal system: Abdomen is nondistended, soft and nontender. No organomegaly or masses felt. Normal bowel sounds heard. Central nervous system: Alert and oriented x2. No focal neurological deficits. Extremities: Symmetric 5 x 5 power. Skin: No rashes, lesions or ulcers Psychiatry: Judgement and insight appear normal. Mood & affect appropriate.     Data Reviewed: I have personally reviewed following labs and imaging studies  CBC: Recent Labs  Lab  11/18/20 0455 11/19/20 0558 11/20/20 0543 11/21/20 0529 11/22/20 0605  WBC 5.0 12.0* 10.6* 8.3 6.0  NEUTROABS 4.4 10.4* 9.2* 7.0 4.9  HGB 16.9 15.1 14.7 16.0 15.3  HCT 46.4 41.4 41.7 43.8 41.7  MCV 89.1 89.4 90.7 89.4 90.1  PLT 325 397 442* 427* 340   Basic Metabolic Panel: Recent Labs  Lab 11/18/20 0455 11/19/20 0558 11/20/20 0543 11/21/20 0529 11/22/20 0605 11/23/20 0652  NA 121* 127* 132* 133* 129* 129*  K 3.6 3.2* 3.5 2.9* 3.5 4.3  CL 84* 92* 96* 94* 93* 96*  CO2 26 26 28 27 27 24   GLUCOSE 140* 144* 149* 138* 116* 138*  BUN 7* 12 12 12 10 12   CREATININE 0.43* 0.45* 0.55* 0.54* <0.30* 0.34*  CALCIUM 7.8* 7.9* 7.6* 7.8* 7.6* 7.9*  MG 2.3 2.3 2.3 2.2 2.1  --   PHOS 2.6 2.1* 2.8 2.2* 2.6 2.3*   GFR: Estimated Creatinine Clearance: 82.7 mL/min (A) (by C-G formula based on SCr of 0.34 mg/dL (L)). Liver Function Tests: Recent Labs  Lab 11/18/20 0455 11/19/20 0558 11/20/20 0543 11/21/20 0529 11/22/20 0605  AST 46* 33 32 34 26  ALT 30 25 24 25 24   ALKPHOS 59 53 54 64 51  BILITOT 0.7 0.7 0.8 0.8 1.0  PROT 6.1* 5.6* 5.5* 5.7* 5.3*  ALBUMIN 2.9* 2.7* 2.6* 2.8* 2.7*   No results for input(s): LIPASE, AMYLASE in the last 168 hours. No results for input(s): AMMONIA in the last 168 hours. Coagulation Profile: Recent Labs  Lab 11/17/20 2006  INR 1.0   Cardiac Enzymes: Recent Labs  Lab 11/17/20 2221  CKTOTAL 167   BNP (last 3 results) No results for input(s): PROBNP in the last 8760 hours. HbA1C: No results for input(s): HGBA1C in the last 72 hours. CBG: No results for input(s): GLUCAP in the last 168 hours. Lipid Profile: No results for input(s): CHOL, HDL, LDLCALC, TRIG, CHOLHDL, LDLDIRECT in the last 72 hours. Thyroid Function Tests: No results for input(s): TSH, T4TOTAL, FREET4, T3FREE, THYROIDAB in the last 72 hours. Anemia Panel: Recent Labs    11/21/20 0529 11/22/20 0605  FERRITIN 364* 372*   Sepsis Labs: Recent Labs  Lab 11/17/20 1948  11/17/20 2221 11/18/20 0620  PROCALCITON  --  0.12  --   LATICACIDVEN 2.3*  --  1.3    Recent Results (from the past 240 hour(s))  Blood culture (routine single)     Status: None   Collection Time: 11/17/20  8:06 PM   Specimen: BLOOD  Result Value Ref Range Status   Specimen Description BLOOD LEFT ANTECUBITAL  Final   Special Requests   Final    BOTTLES DRAWN AEROBIC AND ANAEROBIC Blood Culture adequate volume   Culture   Final    NO GROWTH 5 DAYS Performed at [facility], [address], [place], [facility] [address]    Report Status 11/22/2020 FINAL  Final         Radiology Studies: No results found.  Scheduled Meds: . vitamin C  500 mg Oral Daily  . enoxaparin (LOVENOX) injection  40 mg Subcutaneous Q24H  . Ipratropium-Albuterol  1 puff Inhalation Q6H  . nicotine  21 mg Transdermal Daily  . [START ON 11/24/2020] predniSONE  50 mg Oral Q breakfast  . QUEtiapine  25 mg Oral QHS  . sodium chloride  1 g Oral Daily  . zinc sulfate  220 mg Oral Daily   Continuous Infusions:   LOS: 6 days    Time spent: 28 minutes    Marrion Coy, MD [facility]   To contact the attending provider between 7A-7P or the covering provider during after hours 7P-7A, please log into the web site [email or web address] and access using universal  password for that web site. If you do not have the password, please call the hospital operator.  11/23/2020, 9:28 AM

## 2020-11-23 NOTE — Treatment Plan (Addendum)
MD Zhang notified at this time: Pt is still tachy now into the 130s. he has elevated BP. Do you want to order something for better rate and BP control?  Awaiting orders.   Update: Metoprolol 25mg  BID ordered.

## 2020-11-23 NOTE — Progress Notes (Signed)
Physical Therapy Treatment Patient Details Name: Michael Padilla MRN: [id number] DOB: 12-Apr-1956 Today's Date: 11/23/2020    History of Present Illness Pt is a 65 y.o. male with medical history significant for COPD and tobacco use who presents to the ED for evaluation of generalized weakness and diarrhea. Pt also did have a fall at home, unable to get up.    PT Comments    Pt in bed.  HR noted to be in high 120's, O2 mid 90's despite nasal cannula only in one nostril.  Adjusted.  Participated in exercises as described below.  HR was monitored throughout ex and remained 126-138 during supine ex.  O2 generally stable did drop briefly to 71 but could be attributed to poor contact with finger monitor.   Deferred further mobility given increased HR at rest and gentle supine ex.   Follow Up Recommendations  SNF     Equipment Recommendations  Rolling walker with 5" wheels    Recommendations for Other Services       Precautions / Restrictions Precautions Precautions: Fall Precaution Comments: watch HR/spO2 Restrictions Weight Bearing Restrictions: No    Mobility  Bed Mobility                  Transfers                    Ambulation/Gait                 Stairs             Wheelchair Mobility    Modified Rankin (Stroke Patients Only)       Balance                                            Cognition Arousal/Alertness: Awake/alert Behavior During Therapy: WFL for tasks assessed/performed Overall Cognitive Status: Within Functional Limits for tasks assessed                                 General Comments: did not seem confused this pm.      Exercises Other Exercises Other Exercises: BLE Supine ex x 10 with HR monitoring    General Comments        Pertinent Vitals/Pain Pain Assessment: No/denies pain    Home Living                      Prior Function            PT Goals (current  goals can now be found in the care plan section) Progress towards PT goals: Not progressing toward goals - comment    Frequency    Min 2X/week      PT Plan Current plan remains appropriate    Co-evaluation              AM-PAC PT "6 Clicks" Mobility   Outcome Measure  Help needed turning from your back to your side while in a flat bed without using bedrails?: None Help needed moving from lying on your back to sitting on the side of a flat bed without using bedrails?: None Help needed moving to and from a bed to a chair (including a wheelchair)?: A Little Help needed standing up from a chair using your arms (e.g., wheelchair or bedside  chair)?: A Little Help needed to walk in hospital room?: A Lot Help needed climbing 3-5 steps with a railing? : A Lot 6 Click Score: 18    End of Session Equipment Utilized During Treatment: Oxygen Activity Tolerance: Treatment limited secondary to medical complications (Comment);Other (comment) Patient left: in bed;with call bell/phone within reach;with bed alarm set Nurse Communication: Mobility status       Time: 1700-1749 PT Time Calculation (min) (ACUTE ONLY): 23 min  Charges:  $Therapeutic Exercise: 23-37 mins                    Danielle Dess, PTA 11/23/20, 3:55 PM

## 2020-11-24 LAB — BASIC METABOLIC PANEL
Anion gap: 7 (ref 5–15)
BUN: 13 mg/dL (ref 8–23)
CO2: 29 mmol/L (ref 22–32)
Calcium: 7.9 mg/dL — ABNORMAL LOW (ref 8.9–10.3)
Chloride: 95 mmol/L — ABNORMAL LOW (ref 98–111)
Creatinine, Ser: 0.4 mg/dL — ABNORMAL LOW (ref 0.61–1.24)
GFR, Estimated: >60 mL/min (ref >60)
Glucose, Bld: 89 mg/dL (ref 70–99)
Potassium: 4.1 mmol/L (ref 3.5–5.1)
Sodium: 131 mmol/L — ABNORMAL LOW (ref 135–145)

## 2020-11-24 LAB — CBC WITH DIFFERENTIAL/PLATELET
Abs Immature Granulocytes: 0.25 10*3/uL — ABNORMAL HIGH (ref 0.00–0.07)
Basophils Absolute: 0 10*3/uL (ref 0.0–0.1)
Basophils Relative: 0 %
Eosinophils Absolute: 0.1 10*3/uL (ref 0.0–0.5)
Eosinophils Relative: 1 %
HCT: 43 % (ref 39.0–52.0)
Hemoglobin: 15.3 g/dL (ref 13.0–17.0)
Immature Granulocytes: 2 %
Lymphocytes Relative: 8 %
Lymphs Abs: 1 10*3/uL (ref 0.7–4.0)
MCH: 32.5 pg (ref 26.0–34.0)
MCHC: 35.6 g/dL (ref 30.0–36.0)
MCV: 91.3 fL (ref 80.0–100.0)
Monocytes Absolute: 1.4 10*3/uL — ABNORMAL HIGH (ref 0.1–1.0)
Monocytes Relative: 10 %
Neutro Abs: 10.4 10*3/uL — ABNORMAL HIGH (ref 1.7–7.7)
Neutrophils Relative %: 79 %
Platelets: 295 10*3/uL (ref 150–400)
RBC: 4.71 MIL/uL (ref 4.22–5.81)
RDW: 13.7 % (ref 11.5–15.5)
WBC: 13.1 10*3/uL — ABNORMAL HIGH (ref 4.0–10.5)
nRBC: 0 % (ref 0.0–0.2)

## 2020-11-24 LAB — MAGNESIUM: Magnesium: 2.4 mg/dL (ref 1.7–2.4)

## 2020-11-24 MED ORDER — ALUM & MAG HYDROXIDE-SIMETH 200-200-20 MG/5ML PO SUSP
15.0000 mL | Freq: Once | ORAL | Status: AC
  Administered 2020-11-24: 15 mL via ORAL
  Filled 2020-11-24: qty 30

## 2020-11-24 MED ORDER — METOPROLOL TARTRATE 50 MG PO TABS
50.0000 mg | ORAL_TABLET | Freq: Two times a day (BID) | ORAL | Status: DC
  Administered 2020-11-24 – 2020-11-25 (×2): 50 mg via ORAL
  Filled 2020-11-24 (×2): qty 1

## 2020-11-24 MED ORDER — METOPROLOL TARTRATE 25 MG PO TABS: 25.0000 mg | ORAL_TABLET | Freq: Two times a day (BID) | ORAL | 0 refills | Status: DC

## 2020-11-24 MED ORDER — FAMOTIDINE 20 MG PO TABS
20.0000 mg | ORAL_TABLET | Freq: Every day | ORAL | Status: DC
  Administered 2020-11-24: 20 mg via ORAL
  Filled 2020-11-24: qty 1

## 2020-11-24 MED ORDER — METOPROLOL TARTRATE 50 MG PO TABS: 50.0000 mg | ORAL_TABLET | Freq: Two times a day (BID) | ORAL | 0 refills | Status: DC

## 2020-11-24 MED ORDER — ALUM & MAG HYDROXIDE-SIMETH 200-200-20 MG/5ML PO SUSP
30.0000 mL | Freq: Four times a day (QID) | ORAL | Status: DC | PRN
  Administered 2020-11-24 – 2020-11-25 (×3): 30 mL via ORAL
  Filled 2020-11-24 (×3): qty 30

## 2020-11-24 NOTE — Discharge Summary (Addendum)
Physician Discharge Summary  Patient ID: DIAR BERKEL MRN: [id number] DOB/AGE: [birth year] 65 y.o.  Admit date: 11/17/2020 Discharge date: 11/24/2020  Admission Diagnoses:  Discharge Diagnoses:  Principal Problem:   Gastroenteritis due to COVID-19 virus Active Problems:   Pneumonia due to COVID-19 virus   Hyponatremia   Generalized weakness   COPD with acute exacerbation (HCC)   Acute hypoxemic respiratory failure due to COVID-19 [facility])   Discharged Condition: fair  Hospital Course:  Michael L Braxtonis a 65 y.o.malewith medical history significant forCOPDand tobacco usewho presents to the ED for evaluation of generalized weakness and diarrhea.Sodium level was 117, bicarb 19. He was giving IV fluids for dehydration. Patient was also positive for Covid. He has completed remdesivir. Still on oral steroids. He was initially evaluated for nursing home placement.  However, patient does not like to go to the nursing home that accepted the team.  He opted to go home with home care.  #1. Hyponatremia secondary to dehydration. Hypokalemia and hypophosphatemia. Viral gastroenteritis secondary to Covid. Patient volume status improved.  Still has some hyponatremia.  Patient has SIADH.    Sodium level is better.  Patient be followed by PCP in the near future, please recheck a BMP at the next visit. Patient has sinus tachycardia, appears to be secondary to inhalers and physical deconditioning.  Since his blood pressure also running higher, he is started on metoprolol 25 mg twice a day.   2.  COVID-19 pneumonia. Acute on chronic hypoxemic respiratory failure secondary to COVID-19 pneumonia COPD exacerbation. Condition improved.  Patient will need long-term oxygen as his baseline oxygen was less than 88% at home. He has completed remdesivir, change steroids to oral. Home oxygen will be prescribed.  #3.  Generalized weakness. Possible underlying dementia. Patient refused to go  to nursing home, will set up home PT OT.  #4.  Moderate protein calorie malnutrition. Protein supplement.  Addendum: Patient had increased O2 requirement, more tachycardia. Will keep him for another day. Check BNP. Increased metoprolol to 50mg  bid due to sinus tachycardia and elevated Bp. Also obtain speech eval to rule out aspiration.  Consults: None  Significant Diagnostic Studies: CHEST  1 VIEW  COMPARISON:  Portable exam 0808 hours compared to 11/17/2020  FINDINGS: Normal heart size, mediastinal contours, and pulmonary vascularity.  Atherosclerotic calcification aorta.  Emphysematous and bronchitic changes consistent with COPD.  Increased bibasilar opacities consistent with pneumonia  No pleural effusion or pneumothorax.  Bones demineralized.  IMPRESSION: COPD changes with increased bibasilar opacities consistent with pneumonia.   Electronically Signed   By: 11/19/2020 M.D.   On: 11/20/2020 08:25  Treatments: Steroids and remdesivir  Discharge Exam: Blood pressure (!) 160/105, pulse (!) 113, temperature 97.8 F (36.6 C), temperature source Oral, resp. rate 17, height 5\' 8"  (1.727 m), weight 63.5 kg, SpO2 92 %. General appearance: alert and cooperative Resp: Decreased breathing sounds bilaterally. Cardio: regular rate and rhythm, S1, S2 normal, no murmur, click, rub or gallop GI: soft, non-tender; bowel sounds normal; no masses,  no organomegaly Extremities: extremities normal, atraumatic, no cyanosis or edema  Disposition: Discharge disposition: 01-Home or Self Care       Discharge Instructions    Diet - low sodium heart healthy   Complete by: As directed    Increase activity slowly   Complete by: As directed    Increase activity slowly   Complete by: As directed      Allergies as of 11/24/2020   No Known Allergies  Medication List    TAKE these medications   ascorbic acid 500 MG tablet Commonly known as: VITAMIN C Take 1  tablet (500 mg total) by mouth daily for 14 days.   Ipratropium-Albuterol 20-100 MCG/ACT Aers respimat Commonly known as: COMBIVENT Inhale 1 puff into the lungs every 6 (six) hours as needed for wheezing.   metoprolol tartrate 25 MG tablet Commonly known as: LOPRESSOR Take 1 tablet (25 mg total) by mouth 2 (two) times daily.   predniSONE 10 MG tablet Commonly known as: DELTASONE Take 4 tablets (40 mg total) by mouth daily for 6 days, THEN 2 tablets (20 mg total) daily for 2 days, THEN 1 tablet (10 mg total) daily for 2 days. Start taking on: November 21, 2020   Trelegy Ellipta 100-62.5-25 MCG/INH Aepb Generic drug: Fluticasone-Umeclidin-Vilant Inhale 1 puff into the lungs daily.   zinc sulfate 220 (50 Zn) MG capsule Take 1 capsule (220 mg total) by mouth daily for 14 days.            Durable Medical Equipment  (From admission, onward)         Start     Ordered   11/21/20 0929  For home use only DME oxygen  Once       Question Answer Comment  Length of Need 6 Months   Mode or (Route) Nasal cannula   Liters per Minute 2   Frequency Continuous (stationary and portable oxygen unit needed)   Oxygen conserving device Yes   Oxygen delivery system Gas      11/21/20 0928          Follow-up Information    Vida Rigger, MD Follow up in 2 week(s).   Specialty: Pulmonary Disease Contact information: [address] [place] [address] [phone]        [facility] Go on 12/01/2020.   Why: apt at 11:00, please take insurance card Contact information: [address] [place] [address] [phone]              35 minutes Signed: Marrion Coy 11/24/2020, 1:23 PM

## 2020-11-24 NOTE — Progress Notes (Signed)
Patient alert and oriented, able to follow commands.  Patient able to indpendently get out of bed to standing position  Oxygenation at rest RA 91% Oxygenation with ambulation approximately 5 feet 82% on RA  Patient returned to bed and sat doing deep breathing.  Oxygen at 4 liters placed back on patient via nasal cannula and oxygen recovered to 92% on 4 liters within a couple minutes. Patient tolerated well.  Encouraged IS use and patient was able to complete return demonstration.  Call bell within reach.

## 2020-11-24 NOTE — Care Management Important Message (Signed)
Important Message  Patient Details  Name: Michael Padilla MRN: [id number] Date of Birth: 01/03/1956   Medicare Important Message Given:  Yes  Reviewed with patient via room phone due to isolation status.  Reviewed with son, Brailon Don, at [phone] upon patient's request.  Copy of Medicare IM to be delivered to patient via nursing staff.     [name] 11/24/2020, 11:17 AM

## 2020-11-24 NOTE — Progress Notes (Signed)
Physical Therapy Treatment Patient Details Name: Michael Padilla MRN: [id number] DOB: 1956-06-24 Today's Date: 11/24/2020    History of Present Illness Pt is a 65 y.o. male with medical history significant for COPD and tobacco use who presents to the ED for evaluation of generalized weakness and diarrhea. Pt also did have a fall at home, unable to get up.    PT Comments    The reports that he is ready to d/c upon PT arrival. PT explains role of therapist and encourages him to participate in order to update recommendations as needed. The pt is educated on how to read his pulse oximeter in order to maintain oxygenation levels. The pt is able to correctly report safe O2 range and is able to correctly indicate 5/5 times appropriate needs based upon oxygen levels. Gait distances were limited 2/2 O2 tubing. The pt does demonstrate great ability to self select rest breaks. Current plan remains appropriate, however if he refuses could be safe to d/c home with family care and 24/7 supervision.    Follow Up Recommendations  SNF     Equipment Recommendations  Rolling walker with 5" wheels    Recommendations for Other Services       Precautions / Restrictions Precautions Precautions: Fall Restrictions Weight Bearing Restrictions: No    Mobility  Bed Mobility Overal bed mobility: Modified Independent Bed Mobility: Supine to Sit     Supine to sit: Modified independent (Device/Increase time) Sit to supine: Supervision;HOB elevated      Transfers Overall transfer level: Needs assistance Equipment used: Rolling walker (2 wheeled) Transfers: Sit to/from Stand Sit to Stand: Min assist            Ambulation/Gait     Assistive device: Rolling walker (2 wheeled)       General Gait Details: 2x10s standing marching   Stairs             Wheelchair Mobility    Modified Rankin (Stroke Patients Only)       Balance Overall balance assessment: Needs  assistance Sitting-balance support: Feet supported Sitting balance-Leahy Scale: Good     Standing balance support: Bilateral upper extremity supported Standing balance-Leahy Scale: Good                              Cognition Arousal/Alertness: Awake/alert Behavior During Therapy: WFL for tasks assessed/performed Overall Cognitive Status: Within Functional Limits for tasks assessed                                        Exercises      General Comments        Pertinent Vitals/Pain Pain Assessment: No/denies pain    Home Living                      Prior Function            PT Goals (current goals can now be found in the care plan section) Acute Rehab PT Goals Patient Stated Goal: to get stronger PT Goal Formulation: With patient Time For Goal Achievement: 12/02/20 Potential to Achieve Goals: Good Progress towards PT goals: Progressing toward goals    Frequency    Min 2X/week      PT Plan Current plan remains appropriate    Co-evaluation  AM-PAC PT "6 Clicks" Mobility   Outcome Measure  Help needed turning from your back to your side while in a flat bed without using bedrails?: None Help needed moving from lying on your back to sitting on the side of a flat bed without using bedrails?: None Help needed moving to and from a bed to a chair (including a wheelchair)?: A Little Help needed standing up from a chair using your arms (e.g., wheelchair or bedside chair)?: A Little Help needed to walk in hospital room?: A Lot Help needed climbing 3-5 steps with a railing? : A Lot 6 Click Score: 18    End of Session Equipment Utilized During Treatment: Oxygen Activity Tolerance: Patient tolerated treatment well Patient left: in bed;with call bell/phone within reach;with bed alarm set Nurse Communication: Mobility status PT Visit Diagnosis: Other abnormalities of gait and mobility (R26.89);Muscle weakness  (generalized) (M62.81);Difficulty in walking, not elsewhere classified (R26.2);History of falling (Z91.81);Unsteadiness on feet (R26.81)     Time: 1445-1510 PT Time Calculation (min) (ACUTE ONLY): 25 min  Charges:  $Gait Training: 23-37 mins                    3:55 PM, 11/24/20 Paidyn Mcferran A. Mordecai Maes PT, DPT Physical Therapist - [facility]    Laurajean Hosek A Luvena Wentling 11/24/2020, 3:51 PM

## 2020-11-25 LAB — CBC WITH DIFFERENTIAL/PLATELET
Abs Immature Granulocytes: 0.15 10*3/uL — ABNORMAL HIGH (ref 0.00–0.07)
Basophils Absolute: 0 10*3/uL (ref 0.0–0.1)
Basophils Relative: 0 %
Eosinophils Absolute: 0 10*3/uL (ref 0.0–0.5)
Eosinophils Relative: 0 %
HCT: 44.4 % (ref 39.0–52.0)
Hemoglobin: 16 g/dL (ref 13.0–17.0)
Immature Granulocytes: 2 %
Lymphocytes Relative: 6 %
Lymphs Abs: 0.7 10*3/uL (ref 0.7–4.0)
MCH: 32.4 pg (ref 26.0–34.0)
MCHC: 36 g/dL (ref 30.0–36.0)
MCV: 89.9 fL (ref 80.0–100.0)
Monocytes Absolute: 1.1 10*3/uL — ABNORMAL HIGH (ref 0.1–1.0)
Monocytes Relative: 11 %
Neutro Abs: 8.4 10*3/uL — ABNORMAL HIGH (ref 1.7–7.7)
Neutrophils Relative %: 81 %
Platelets: 280 10*3/uL (ref 150–400)
RBC: 4.94 MIL/uL (ref 4.22–5.81)
RDW: 13.7 % (ref 11.5–15.5)
WBC: 10.3 10*3/uL (ref 4.0–10.5)
nRBC: 0 % (ref 0.0–0.2)

## 2020-11-25 LAB — BASIC METABOLIC PANEL
Anion gap: 8 (ref 5–15)
BUN: 13 mg/dL (ref 8–23)
CO2: 27 mmol/L (ref 22–32)
Calcium: 7.4 mg/dL — ABNORMAL LOW (ref 8.9–10.3)
Chloride: 93 mmol/L — ABNORMAL LOW (ref 98–111)
Creatinine, Ser: 0.37 mg/dL — ABNORMAL LOW (ref 0.61–1.24)
GFR, Estimated: >60 mL/min (ref >60)
Glucose, Bld: 176 mg/dL — ABNORMAL HIGH (ref 70–99)
Potassium: 4.5 mmol/L (ref 3.5–5.1)
Sodium: 128 mmol/L — ABNORMAL LOW (ref 135–145)

## 2020-11-25 LAB — BRAIN NATRIURETIC PEPTIDE: B Natriuretic Peptide: 398.8 pg/mL — ABNORMAL HIGH (ref 0.0–100.0)

## 2020-11-25 LAB — MAGNESIUM: Magnesium: 2.4 mg/dL (ref 1.7–2.4)

## 2020-11-25 LAB — C-REACTIVE PROTEIN: CRP: 2.9 mg/dL — ABNORMAL HIGH (ref <1.0)

## 2020-11-25 LAB — PROCALCITONIN: Procalcitonin: 0.12 ng/mL

## 2020-11-25 MED ORDER — PNEUMOCOCCAL VAC POLYVALENT 25 MCG/0.5ML IJ INJ: 0.5000 mL | INJECTION | INTRAMUSCULAR | Status: DC

## 2020-11-25 MED ORDER — FUROSEMIDE 10 MG/ML IJ SOLN
20.0000 mg | Freq: Once | INTRAMUSCULAR | Status: AC
  Administered 2020-11-25: 20 mg via INTRAVENOUS
  Filled 2020-11-25: qty 4

## 2020-11-25 MED ORDER — INFLUENZA VAC A&B SA ADJ QUAD 0.5 ML IM PRSY: 0.5000 mL | PREFILLED_SYRINGE | INTRAMUSCULAR | Status: DC

## 2020-11-25 NOTE — Plan of Care (Signed)
Pt is alert and oriented but forgetful. On oxygen at 3Lpm/[place] with oxygen saturation at 90-93%. 02 remove 2x by pt, desats to low 80s. Refused to ambulate this shift. Complained of indigestion; Maalox and famotidine given.

## 2020-11-25 NOTE — TOC Progression Note (Addendum)
Transition of Care [facility]) - Progression Note    Patient Details  Name: Michael Padilla MRN: [id number] Date of Birth: 1956-03-14  Transition of Care [facility]) CM/SW Contact  Maree Krabbe, LCSW Phone Number: 11/25/2020, 3:44 PM  Clinical Narrative: 02 referral sent to [employer]--notified Zach. Need 02 order--MD notified. 02 was delivered to bedside yesterday.          Expected Discharge Plan and Services           Expected Discharge Date: 11/25/20                                     Social Determinants of Health (SDOH) Interventions    Readmission Risk Interventions No flowsheet data found.

## 2020-11-25 NOTE — Progress Notes (Signed)
Pt seen and examined, no changes from Dc summary as noted by Dr.Zhang yesterday, stable for discharge home on Home O2 2L, advised close FU with PCP in 1 -2weeks  Zannie Cove, MD

## 2020-11-25 NOTE — Progress Notes (Signed)
SATURATION QUALIFICATIONS: (This note is used to comply with regulatory documentation for home oxygen)  Patient Saturations on Room Air at Rest = 89%  Patient Saturations on Room Air while Ambulating = 87%  Patient Saturations on 2 Liters of oxygen while Ambulating = 93%  Please briefly explain why patient needs home oxygen: Pt requires oxygen to maintain oxygen saturations.

## 2020-11-25 NOTE — Progress Notes (Signed)
Discharge instructions reviewed by Gordy Clement RN. Patient sent out via wheelchair with belongings and oxygen on to waiting ride

## 2020-11-26 DIAGNOSIS — J441 Chronic obstructive pulmonary disease with (acute) exacerbation: Secondary | ICD-10-CM | POA: Diagnosis not present

## 2020-11-26 DIAGNOSIS — R69 Illness, unspecified: Secondary | ICD-10-CM | POA: Diagnosis not present

## 2020-11-26 DIAGNOSIS — J1282 Pneumonia due to coronavirus disease 2019: Secondary | ICD-10-CM | POA: Diagnosis not present

## 2020-11-26 DIAGNOSIS — E871 Hypo-osmolality and hyponatremia: Secondary | ICD-10-CM | POA: Diagnosis not present

## 2020-11-26 DIAGNOSIS — Z9981 Dependence on supplemental oxygen: Secondary | ICD-10-CM | POA: Diagnosis not present

## 2020-11-26 DIAGNOSIS — J9601 Acute respiratory failure with hypoxia: Secondary | ICD-10-CM | POA: Diagnosis not present

## 2020-11-26 DIAGNOSIS — K529 Noninfective gastroenteritis and colitis, unspecified: Secondary | ICD-10-CM | POA: Diagnosis not present

## 2020-11-26 DIAGNOSIS — J440 Chronic obstructive pulmonary disease with acute lower respiratory infection: Secondary | ICD-10-CM | POA: Diagnosis not present

## 2020-11-26 DIAGNOSIS — U071 COVID-19: Secondary | ICD-10-CM | POA: Diagnosis not present

## 2020-11-26 DIAGNOSIS — J9621 Acute and chronic respiratory failure with hypoxia: Secondary | ICD-10-CM | POA: Diagnosis not present

## 2020-11-26 DIAGNOSIS — E876 Hypokalemia: Secondary | ICD-10-CM | POA: Diagnosis not present

## 2020-11-30 DIAGNOSIS — E871 Hypo-osmolality and hyponatremia: Secondary | ICD-10-CM | POA: Diagnosis not present

## 2020-11-30 DIAGNOSIS — K529 Noninfective gastroenteritis and colitis, unspecified: Secondary | ICD-10-CM | POA: Diagnosis not present

## 2020-11-30 DIAGNOSIS — J9621 Acute and chronic respiratory failure with hypoxia: Secondary | ICD-10-CM | POA: Diagnosis not present

## 2020-11-30 DIAGNOSIS — U071 COVID-19: Secondary | ICD-10-CM | POA: Diagnosis not present

## 2020-11-30 DIAGNOSIS — R69 Illness, unspecified: Secondary | ICD-10-CM | POA: Diagnosis not present

## 2020-11-30 DIAGNOSIS — E876 Hypokalemia: Secondary | ICD-10-CM | POA: Diagnosis not present

## 2020-11-30 DIAGNOSIS — J1282 Pneumonia due to coronavirus disease 2019: Secondary | ICD-10-CM | POA: Diagnosis not present

## 2020-11-30 DIAGNOSIS — Z9981 Dependence on supplemental oxygen: Secondary | ICD-10-CM | POA: Diagnosis not present

## 2020-11-30 DIAGNOSIS — J440 Chronic obstructive pulmonary disease with acute lower respiratory infection: Secondary | ICD-10-CM | POA: Diagnosis not present

## 2020-11-30 DIAGNOSIS — J441 Chronic obstructive pulmonary disease with (acute) exacerbation: Secondary | ICD-10-CM | POA: Diagnosis not present

## 2020-12-07 DIAGNOSIS — U099 Post covid-19 condition, unspecified: Secondary | ICD-10-CM | POA: Diagnosis not present

## 2020-12-07 DIAGNOSIS — R609 Edema, unspecified: Secondary | ICD-10-CM | POA: Diagnosis not present

## 2020-12-14 DIAGNOSIS — R609 Edema, unspecified: Secondary | ICD-10-CM | POA: Diagnosis not present

## 2020-12-14 DIAGNOSIS — I361 Nonrheumatic tricuspid (valve) insufficiency: Secondary | ICD-10-CM | POA: Diagnosis not present

## 2020-12-16 DIAGNOSIS — R609 Edema, unspecified: Secondary | ICD-10-CM | POA: Diagnosis not present

## 2020-12-21 DIAGNOSIS — R609 Edema, unspecified: Secondary | ICD-10-CM | POA: Diagnosis not present

## 2020-12-21 DIAGNOSIS — U099 Post covid-19 condition, unspecified: Secondary | ICD-10-CM | POA: Diagnosis not present

## 2020-12-22 DIAGNOSIS — J9601 Acute respiratory failure with hypoxia: Secondary | ICD-10-CM | POA: Diagnosis not present

## 2020-12-22 DIAGNOSIS — U071 COVID-19: Secondary | ICD-10-CM | POA: Diagnosis not present

## 2020-12-24 DIAGNOSIS — U071 COVID-19: Secondary | ICD-10-CM | POA: Diagnosis not present

## 2020-12-24 DIAGNOSIS — J9601 Acute respiratory failure with hypoxia: Secondary | ICD-10-CM | POA: Diagnosis not present

## 2021-01-04 DIAGNOSIS — R609 Edema, unspecified: Secondary | ICD-10-CM | POA: Diagnosis not present

## 2021-01-04 DIAGNOSIS — U099 Post covid-19 condition, unspecified: Secondary | ICD-10-CM | POA: Diagnosis not present

## 2021-01-18 DIAGNOSIS — R609 Edema, unspecified: Secondary | ICD-10-CM | POA: Diagnosis not present

## 2021-01-18 DIAGNOSIS — U099 Post covid-19 condition, unspecified: Secondary | ICD-10-CM | POA: Diagnosis not present

## 2021-01-22 DIAGNOSIS — J9601 Acute respiratory failure with hypoxia: Secondary | ICD-10-CM | POA: Diagnosis not present

## 2021-01-22 DIAGNOSIS — U071 COVID-19: Secondary | ICD-10-CM | POA: Diagnosis not present

## 2021-01-24 DIAGNOSIS — U071 COVID-19: Secondary | ICD-10-CM | POA: Diagnosis not present

## 2021-01-24 DIAGNOSIS — J9601 Acute respiratory failure with hypoxia: Secondary | ICD-10-CM | POA: Diagnosis not present

## 2021-02-21 DIAGNOSIS — U071 COVID-19: Secondary | ICD-10-CM | POA: Diagnosis not present

## 2021-02-21 DIAGNOSIS — J9601 Acute respiratory failure with hypoxia: Secondary | ICD-10-CM | POA: Diagnosis not present

## 2021-02-23 DIAGNOSIS — J9601 Acute respiratory failure with hypoxia: Secondary | ICD-10-CM | POA: Diagnosis not present

## 2021-02-23 DIAGNOSIS — U071 COVID-19: Secondary | ICD-10-CM | POA: Diagnosis not present

## 2021-03-24 DIAGNOSIS — U071 COVID-19: Secondary | ICD-10-CM | POA: Diagnosis not present

## 2021-03-24 DIAGNOSIS — J9601 Acute respiratory failure with hypoxia: Secondary | ICD-10-CM | POA: Diagnosis not present

## 2021-03-26 DIAGNOSIS — J9601 Acute respiratory failure with hypoxia: Secondary | ICD-10-CM | POA: Diagnosis not present

## 2021-03-26 DIAGNOSIS — U071 COVID-19: Secondary | ICD-10-CM | POA: Diagnosis not present

## 2021-04-23 DIAGNOSIS — J9601 Acute respiratory failure with hypoxia: Secondary | ICD-10-CM | POA: Diagnosis not present

## 2021-04-23 DIAGNOSIS — U071 COVID-19: Secondary | ICD-10-CM | POA: Diagnosis not present

## 2021-04-25 DIAGNOSIS — U071 COVID-19: Secondary | ICD-10-CM | POA: Diagnosis not present

## 2021-04-25 DIAGNOSIS — J9601 Acute respiratory failure with hypoxia: Secondary | ICD-10-CM | POA: Diagnosis not present

## 2021-05-24 DIAGNOSIS — J9601 Acute respiratory failure with hypoxia: Secondary | ICD-10-CM | POA: Diagnosis not present

## 2021-05-24 DIAGNOSIS — U071 COVID-19: Secondary | ICD-10-CM | POA: Diagnosis not present

## 2021-05-26 DIAGNOSIS — U071 COVID-19: Secondary | ICD-10-CM | POA: Diagnosis not present

## 2021-05-26 DIAGNOSIS — J9601 Acute respiratory failure with hypoxia: Secondary | ICD-10-CM | POA: Diagnosis not present

## 2021-06-24 DIAGNOSIS — J9601 Acute respiratory failure with hypoxia: Secondary | ICD-10-CM | POA: Diagnosis not present

## 2021-06-24 DIAGNOSIS — U071 COVID-19: Secondary | ICD-10-CM | POA: Diagnosis not present

## 2021-06-26 DIAGNOSIS — J9601 Acute respiratory failure with hypoxia: Secondary | ICD-10-CM | POA: Diagnosis not present

## 2021-06-26 DIAGNOSIS — U071 COVID-19: Secondary | ICD-10-CM | POA: Diagnosis not present

## 2021-07-24 DIAGNOSIS — U071 COVID-19: Secondary | ICD-10-CM | POA: Diagnosis not present

## 2021-07-24 DIAGNOSIS — J9601 Acute respiratory failure with hypoxia: Secondary | ICD-10-CM | POA: Diagnosis not present

## 2021-07-26 DIAGNOSIS — U071 COVID-19: Secondary | ICD-10-CM | POA: Diagnosis not present

## 2021-07-26 DIAGNOSIS — J9601 Acute respiratory failure with hypoxia: Secondary | ICD-10-CM | POA: Diagnosis not present

## 2021-08-24 DIAGNOSIS — U071 COVID-19: Secondary | ICD-10-CM | POA: Diagnosis not present

## 2021-08-24 DIAGNOSIS — J9601 Acute respiratory failure with hypoxia: Secondary | ICD-10-CM | POA: Diagnosis not present

## 2021-08-26 DIAGNOSIS — U071 COVID-19: Secondary | ICD-10-CM | POA: Diagnosis not present

## 2021-08-26 DIAGNOSIS — J9601 Acute respiratory failure with hypoxia: Secondary | ICD-10-CM | POA: Diagnosis not present

## 2021-09-23 DIAGNOSIS — U071 COVID-19: Secondary | ICD-10-CM | POA: Diagnosis not present

## 2021-09-23 DIAGNOSIS — J9601 Acute respiratory failure with hypoxia: Secondary | ICD-10-CM | POA: Diagnosis not present

## 2021-09-25 DIAGNOSIS — U071 COVID-19: Secondary | ICD-10-CM | POA: Diagnosis not present

## 2021-09-25 DIAGNOSIS — J9601 Acute respiratory failure with hypoxia: Secondary | ICD-10-CM | POA: Diagnosis not present

## 2021-10-24 DIAGNOSIS — J9601 Acute respiratory failure with hypoxia: Secondary | ICD-10-CM | POA: Diagnosis not present

## 2021-10-24 DIAGNOSIS — U071 COVID-19: Secondary | ICD-10-CM | POA: Diagnosis not present

## 2021-10-26 DIAGNOSIS — J9601 Acute respiratory failure with hypoxia: Secondary | ICD-10-CM | POA: Diagnosis not present

## 2021-10-26 DIAGNOSIS — U071 COVID-19: Secondary | ICD-10-CM | POA: Diagnosis not present

## 2021-11-24 DIAGNOSIS — U071 COVID-19: Secondary | ICD-10-CM | POA: Diagnosis not present

## 2021-11-24 DIAGNOSIS — J9601 Acute respiratory failure with hypoxia: Secondary | ICD-10-CM | POA: Diagnosis not present

## 2021-11-26 DIAGNOSIS — J9601 Acute respiratory failure with hypoxia: Secondary | ICD-10-CM | POA: Diagnosis not present

## 2021-11-26 DIAGNOSIS — U071 COVID-19: Secondary | ICD-10-CM | POA: Diagnosis not present

## 2021-12-19 IMAGING — DX DG CHEST 1V
1 series · 2 of 2 positions shown · non-contrast
Comparison: Portable exam 1919 hours compared to 11/17/2020

CLINICAL DATA: Acute hypoxemic respiratory failure due to [id number],
weakness, shortness of breath, COPD

EXAM:
CHEST  1 VIEW

[Series 1: chest ap · 0.14mm/px · 2 of 2 slices shown]
[im 1/2]
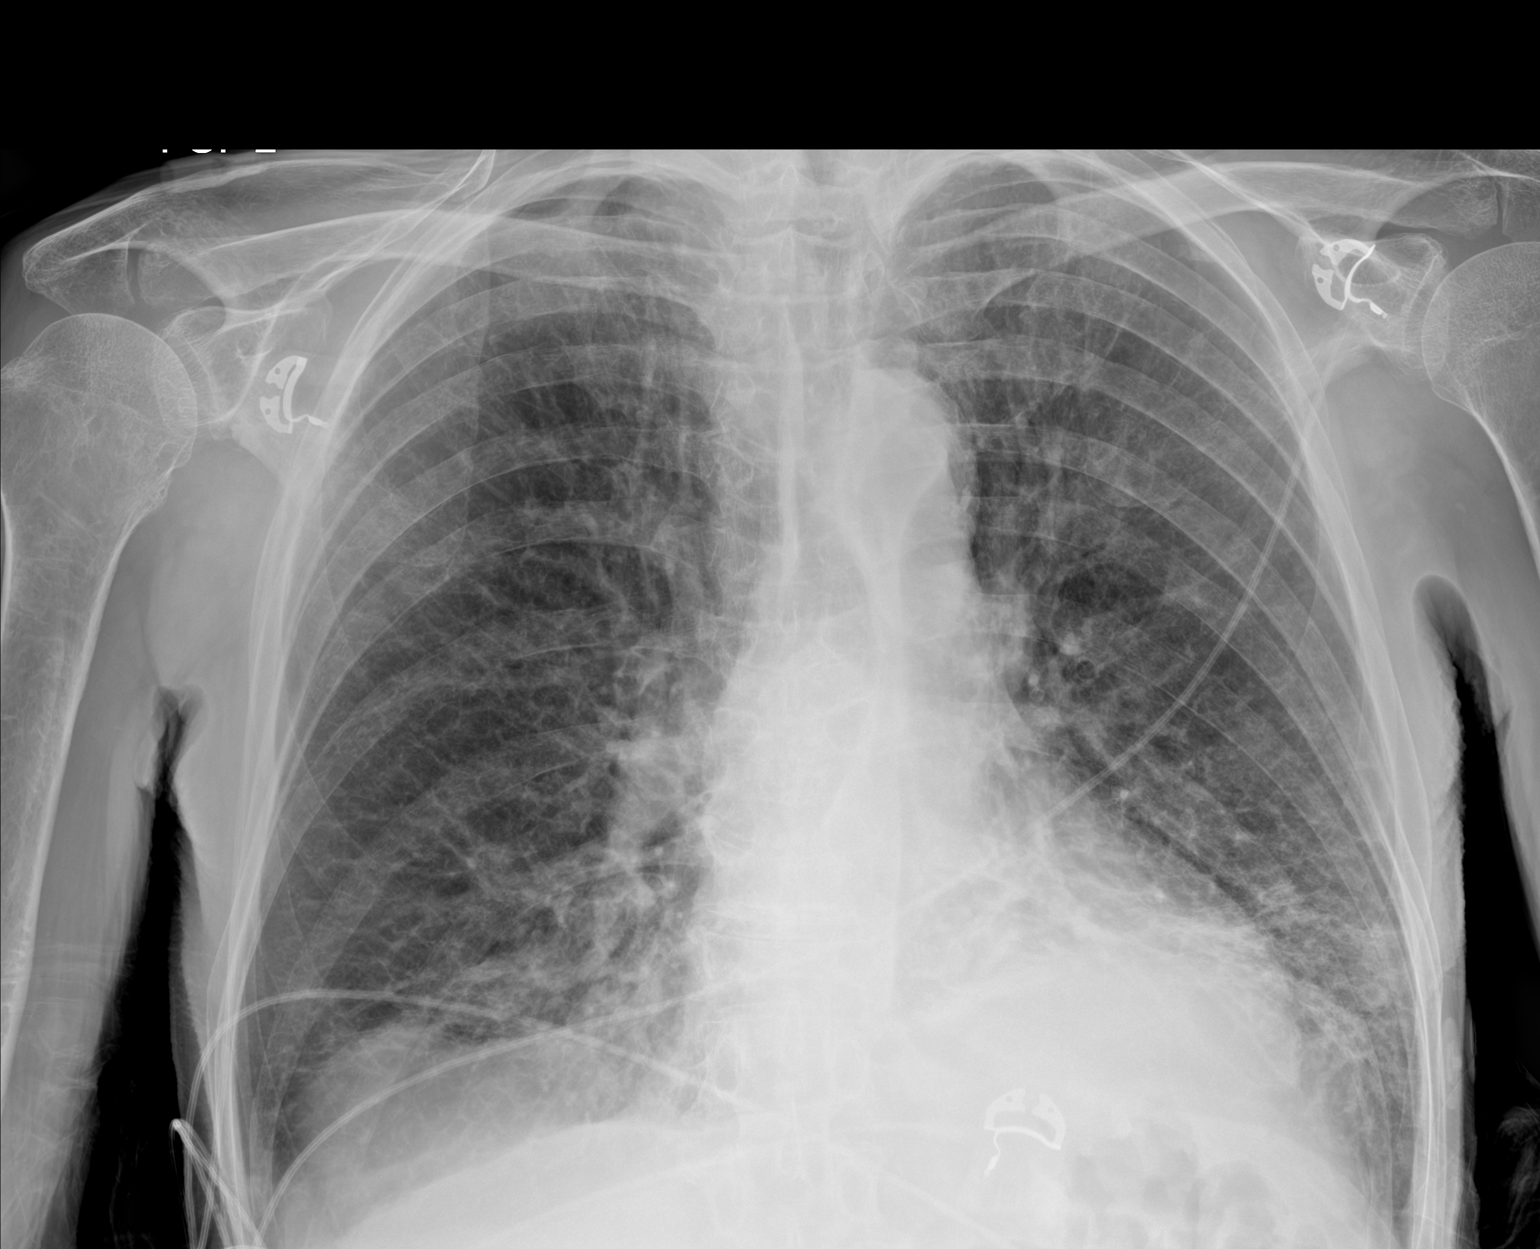
[im 2/2]
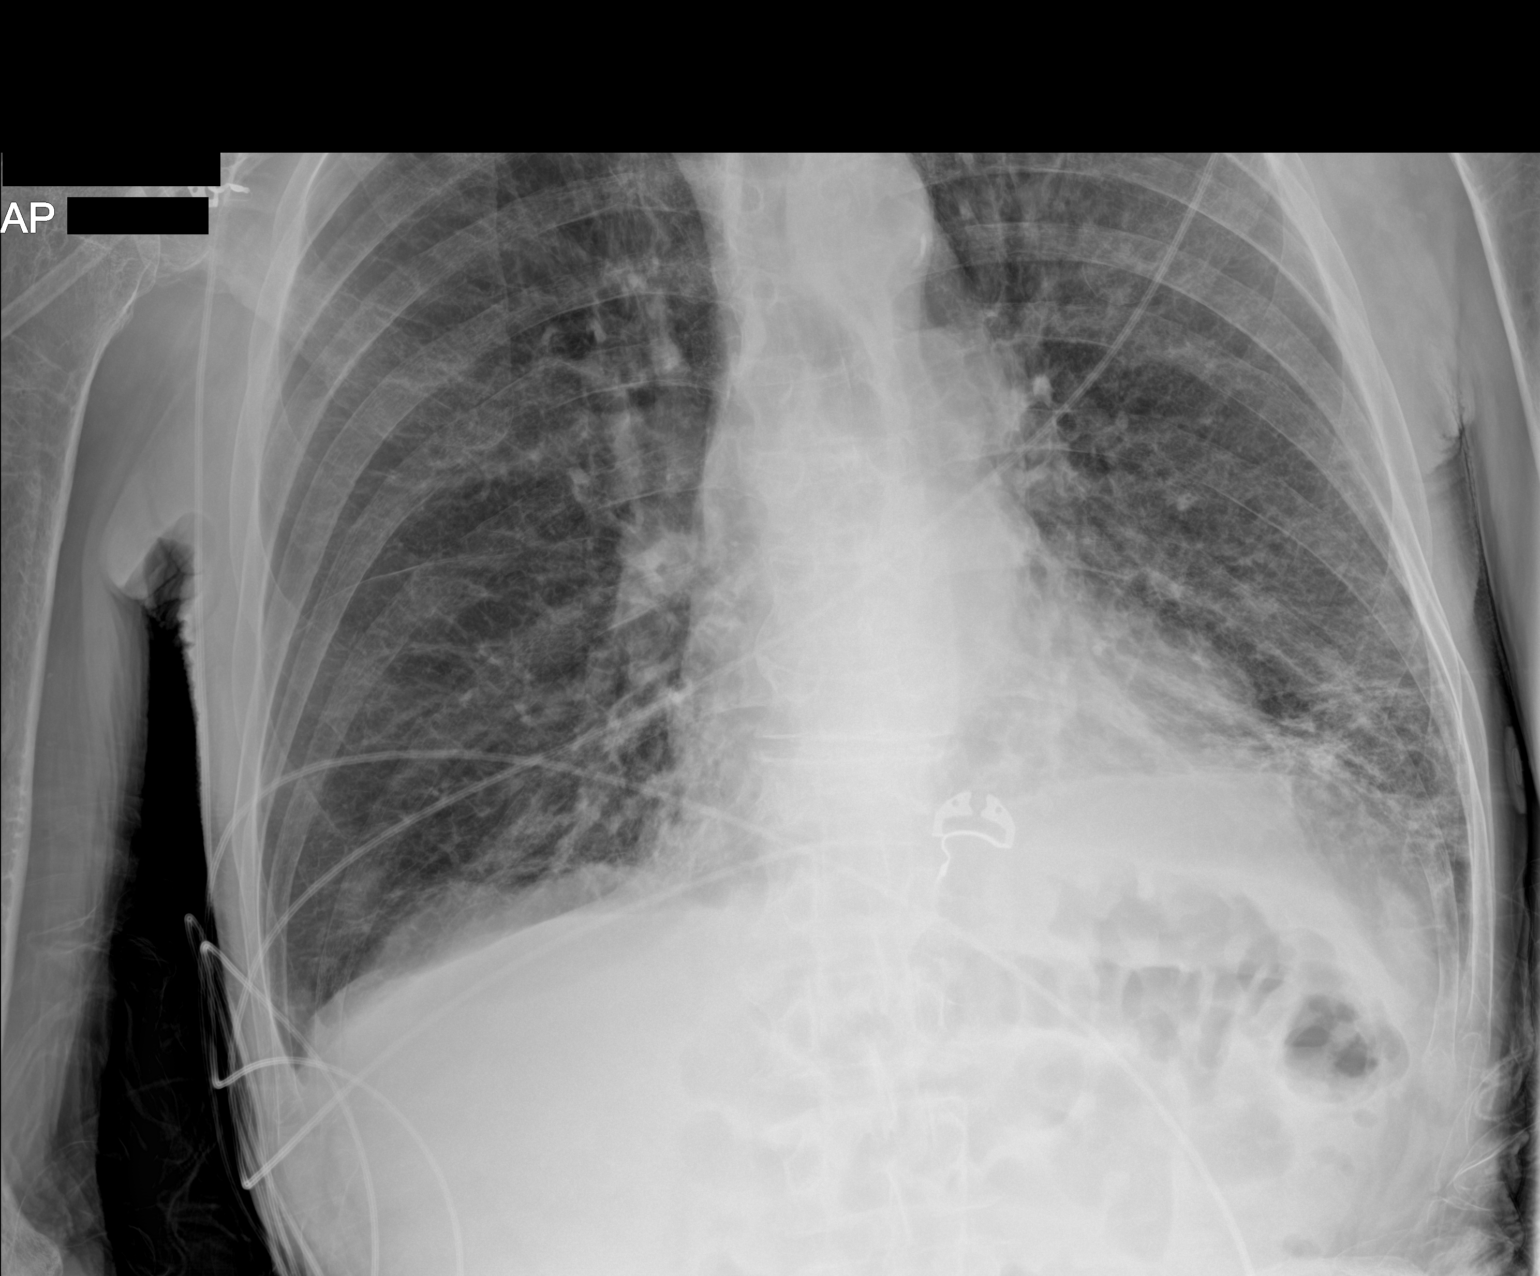

[2 of 2 positions shown; findings below may reference images not displayed]

FINDINGS: Normal heart size, mediastinal contours, and pulmonary vascularity.

Atherosclerotic calcification aorta.

Emphysematous and bronchitic changes consistent with COPD.

Increased bibasilar opacities consistent with pneumonia

No pleural effusion or pneumothorax.

Bones demineralized.
IMPRESSION: COPD changes with increased bibasilar opacities consistent with
pneumonia.

## 2021-12-22 DIAGNOSIS — J9601 Acute respiratory failure with hypoxia: Secondary | ICD-10-CM | POA: Diagnosis not present

## 2021-12-22 DIAGNOSIS — U071 COVID-19: Secondary | ICD-10-CM | POA: Diagnosis not present

## 2021-12-24 DIAGNOSIS — J9601 Acute respiratory failure with hypoxia: Secondary | ICD-10-CM | POA: Diagnosis not present

## 2021-12-24 DIAGNOSIS — U071 COVID-19: Secondary | ICD-10-CM | POA: Diagnosis not present

## 2022-01-02 ENCOUNTER — Inpatient Hospital Stay
Admission: EM | Admit: 2022-01-02 | Discharge: 2022-01-09 | DRG: 070 | Disposition: A | Discharge status: Home Health | Payer: Medicare HMO | Attending: Student | Admitting: Student

## 2022-01-02 ENCOUNTER — Observation Stay: Payer: Medicare HMO

## 2022-01-02 ENCOUNTER — Emergency Department: Payer: Medicare HMO

## 2022-01-02 ENCOUNTER — Inpatient Hospital Stay: Payer: Medicare HMO

## 2022-01-02 DIAGNOSIS — I639 Cerebral infarction, unspecified: Secondary | ICD-10-CM | POA: Diagnosis not present

## 2022-01-02 DIAGNOSIS — Z743 Need for continuous supervision: Secondary | ICD-10-CM | POA: Diagnosis not present

## 2022-01-02 DIAGNOSIS — G9341 Metabolic encephalopathy: Principal | ICD-10-CM | POA: Diagnosis present

## 2022-01-02 DIAGNOSIS — M4854XA Collapsed vertebra, not elsewhere classified, thoracic region, initial encounter for fracture: Secondary | ICD-10-CM | POA: Diagnosis not present

## 2022-01-02 DIAGNOSIS — R471 Dysarthria and anarthria: Secondary | ICD-10-CM | POA: Diagnosis not present

## 2022-01-02 DIAGNOSIS — F1010 Alcohol abuse, uncomplicated: Secondary | ICD-10-CM | POA: Diagnosis not present

## 2022-01-02 DIAGNOSIS — R4182 Altered mental status, unspecified: Secondary | ICD-10-CM

## 2022-01-02 DIAGNOSIS — F17210 Nicotine dependence, cigarettes, uncomplicated: Secondary | ICD-10-CM | POA: Diagnosis present

## 2022-01-02 DIAGNOSIS — E43 Unspecified severe protein-calorie malnutrition: Secondary | ICD-10-CM | POA: Diagnosis present

## 2022-01-02 DIAGNOSIS — R29709 NIHSS score 9: Secondary | ICD-10-CM | POA: Diagnosis present

## 2022-01-02 DIAGNOSIS — E538 Deficiency of other specified B group vitamins: Secondary | ICD-10-CM | POA: Diagnosis not present

## 2022-01-02 DIAGNOSIS — Z7951 Long term (current) use of inhaled steroids: Secondary | ICD-10-CM

## 2022-01-02 DIAGNOSIS — J9601 Acute respiratory failure with hypoxia: Secondary | ICD-10-CM | POA: Diagnosis not present

## 2022-01-02 DIAGNOSIS — R0602 Shortness of breath: Secondary | ICD-10-CM

## 2022-01-02 DIAGNOSIS — J9621 Acute and chronic respiratory failure with hypoxia: Secondary | ICD-10-CM | POA: Diagnosis present

## 2022-01-02 DIAGNOSIS — E876 Hypokalemia: Secondary | ICD-10-CM | POA: Diagnosis not present

## 2022-01-02 DIAGNOSIS — I10 Essential (primary) hypertension: Secondary | ICD-10-CM | POA: Diagnosis not present

## 2022-01-02 DIAGNOSIS — E860 Dehydration: Secondary | ICD-10-CM | POA: Diagnosis present

## 2022-01-02 DIAGNOSIS — D696 Thrombocytopenia, unspecified: Secondary | ICD-10-CM | POA: Diagnosis not present

## 2022-01-02 DIAGNOSIS — E871 Hypo-osmolality and hyponatremia: Secondary | ICD-10-CM | POA: Diagnosis not present

## 2022-01-02 DIAGNOSIS — R4189 Other symptoms and signs involving cognitive functions and awareness: Secondary | ICD-10-CM | POA: Insufficient documentation

## 2022-01-02 DIAGNOSIS — N390 Urinary tract infection, site not specified: Secondary | ICD-10-CM | POA: Diagnosis present

## 2022-01-02 DIAGNOSIS — R4020 Unspecified coma: Secondary | ICD-10-CM | POA: Diagnosis not present

## 2022-01-02 DIAGNOSIS — Z681 Body mass index (BMI) 19 or less, adult: Secondary | ICD-10-CM

## 2022-01-02 DIAGNOSIS — J690 Pneumonitis due to inhalation of food and vomit: Secondary | ICD-10-CM | POA: Diagnosis not present

## 2022-01-02 DIAGNOSIS — E559 Vitamin D deficiency, unspecified: Secondary | ICD-10-CM | POA: Diagnosis present

## 2022-01-02 DIAGNOSIS — R404 Transient alteration of awareness: Secondary | ICD-10-CM | POA: Diagnosis not present

## 2022-01-02 DIAGNOSIS — G9340 Encephalopathy, unspecified: Secondary | ICD-10-CM | POA: Insufficient documentation

## 2022-01-02 DIAGNOSIS — J449 Chronic obstructive pulmonary disease, unspecified: Secondary | ICD-10-CM | POA: Diagnosis not present

## 2022-01-02 DIAGNOSIS — M1990 Unspecified osteoarthritis, unspecified site: Secondary | ICD-10-CM | POA: Diagnosis not present

## 2022-01-02 DIAGNOSIS — N3000 Acute cystitis without hematuria: Secondary | ICD-10-CM | POA: Diagnosis not present

## 2022-01-02 DIAGNOSIS — J189 Pneumonia, unspecified organism: Secondary | ICD-10-CM | POA: Diagnosis not present

## 2022-01-02 DIAGNOSIS — R0902 Hypoxemia: Secondary | ICD-10-CM

## 2022-01-02 DIAGNOSIS — Z8616 Personal history of COVID-19: Secondary | ICD-10-CM

## 2022-01-02 DIAGNOSIS — I6503 Occlusion and stenosis of bilateral vertebral arteries: Secondary | ICD-10-CM | POA: Diagnosis not present

## 2022-01-02 DIAGNOSIS — I2510 Atherosclerotic heart disease of native coronary artery without angina pectoris: Secondary | ICD-10-CM | POA: Diagnosis not present

## 2022-01-02 DIAGNOSIS — G319 Degenerative disease of nervous system, unspecified: Secondary | ICD-10-CM | POA: Diagnosis not present

## 2022-01-02 DIAGNOSIS — I63542 Cerebral infarction due to unspecified occlusion or stenosis of left cerebellar artery: Secondary | ICD-10-CM | POA: Diagnosis not present

## 2022-01-02 DIAGNOSIS — J441 Chronic obstructive pulmonary disease with (acute) exacerbation: Secondary | ICD-10-CM | POA: Diagnosis not present

## 2022-01-02 DIAGNOSIS — Z7141 Alcohol abuse counseling and surveillance of alcoholic: Secondary | ICD-10-CM

## 2022-01-02 DIAGNOSIS — R000 Tachycardia, unspecified: Secondary | ICD-10-CM | POA: Diagnosis not present

## 2022-01-02 DIAGNOSIS — J9811 Atelectasis: Secondary | ICD-10-CM | POA: Diagnosis not present

## 2022-01-02 DIAGNOSIS — J439 Emphysema, unspecified: Secondary | ICD-10-CM | POA: Diagnosis not present

## 2022-01-02 DIAGNOSIS — I469 Cardiac arrest, cause unspecified: Secondary | ICD-10-CM | POA: Diagnosis not present

## 2022-01-02 DIAGNOSIS — R69 Illness, unspecified: Secondary | ICD-10-CM | POA: Diagnosis not present

## 2022-01-02 DIAGNOSIS — Z20822 Contact with and (suspected) exposure to covid-19: Secondary | ICD-10-CM | POA: Diagnosis present

## 2022-01-02 DIAGNOSIS — I6389 Other cerebral infarction: Secondary | ICD-10-CM | POA: Diagnosis not present

## 2022-01-02 DIAGNOSIS — I6523 Occlusion and stenosis of bilateral carotid arteries: Secondary | ICD-10-CM | POA: Diagnosis not present

## 2022-01-02 DIAGNOSIS — Z79899 Other long term (current) drug therapy: Secondary | ICD-10-CM

## 2022-01-02 DIAGNOSIS — E041 Nontoxic single thyroid nodule: Secondary | ICD-10-CM | POA: Diagnosis not present

## 2022-01-02 LAB — CBC
HCT: 42.7 % (ref 39.0–52.0)
Hemoglobin: 14.5 g/dL (ref 13.0–17.0)
MCH: 33 pg (ref 26.0–34.0)
MCHC: 34 g/dL (ref 30.0–36.0)
MCV: 97 fL (ref 80.0–100.0)
Platelets: 605 10*3/uL — ABNORMAL HIGH (ref 150–400)
RBC: 4.4 MIL/uL (ref 4.22–5.81)
RDW: 13.8 % (ref 11.5–15.5)
WBC: 17.7 10*3/uL — ABNORMAL HIGH (ref 4.0–10.5)
nRBC: 0 % (ref 0.0–0.2)

## 2022-01-02 LAB — COMPREHENSIVE METABOLIC PANEL
ALT: 17 U/L (ref 0–44)
AST: 33 U/L (ref 15–41)
Albumin: 3.9 g/dL (ref 3.5–5.0)
Alkaline Phosphatase: 104 U/L (ref 38–126)
Anion gap: 10 (ref 5–15)
BUN: 6 mg/dL — ABNORMAL LOW (ref 8–23)
CO2: 25 mmol/L (ref 22–32)
Calcium: 8.5 mg/dL — ABNORMAL LOW (ref 8.9–10.3)
Chloride: 89 mmol/L — ABNORMAL LOW (ref 98–111)
Creatinine, Ser: 0.56 mg/dL — ABNORMAL LOW (ref 0.61–1.24)
GFR, Estimated: >60 mL/min (ref >60)
Glucose, Bld: 155 mg/dL — ABNORMAL HIGH (ref 70–99)
Potassium: 3.8 mmol/L (ref 3.5–5.1)
Sodium: 124 mmol/L — ABNORMAL LOW (ref 135–145)
Total Bilirubin: 1.2 mg/dL (ref 0.3–1.2)
Total Protein: 7.2 g/dL (ref 6.5–8.1)

## 2022-01-02 LAB — BASIC METABOLIC PANEL
Anion gap: 9 (ref 5–15)
Anion gap: 12 (ref 5–15)
BUN: 6 mg/dL — ABNORMAL LOW (ref 8–23)
BUN: 6 mg/dL — ABNORMAL LOW (ref 8–23)
CO2: 26 mmol/L (ref 22–32)
CO2: 24 mmol/L (ref 22–32)
Calcium: 8.4 mg/dL — ABNORMAL LOW (ref 8.9–10.3)
Calcium: 8.6 mg/dL — ABNORMAL LOW (ref 8.9–10.3)
Chloride: 90 mmol/L — ABNORMAL LOW (ref 98–111)
Chloride: 90 mmol/L — ABNORMAL LOW (ref 98–111)
Creatinine, Ser: 0.43 mg/dL — ABNORMAL LOW (ref 0.61–1.24)
Creatinine, Ser: 0.39 mg/dL — ABNORMAL LOW (ref 0.61–1.24)
GFR, Estimated: >60 mL/min (ref >60)
GFR, Estimated: >60 mL/min (ref >60)
Glucose, Bld: 151 mg/dL — ABNORMAL HIGH (ref 70–99)
Glucose, Bld: 152 mg/dL — ABNORMAL HIGH (ref 70–99)
Potassium: 3.5 mmol/L (ref 3.5–5.1)
Potassium: 3.8 mmol/L (ref 3.5–5.1)
Sodium: 125 mmol/L — ABNORMAL LOW (ref 135–145)
Sodium: 126 mmol/L — ABNORMAL LOW (ref 135–145)

## 2022-01-02 LAB — AMMONIA: Ammonia: 30 umol/L (ref 9–35)

## 2022-01-02 LAB — CBG MONITORING, ED: Glucose-Capillary: 126 mg/dL — ABNORMAL HIGH (ref 70–99)

## 2022-01-02 LAB — GLUCOSE, CAPILLARY
Glucose-Capillary: 163 mg/dL — ABNORMAL HIGH (ref 70–99)
Glucose-Capillary: 175 mg/dL — ABNORMAL HIGH (ref 70–99)

## 2022-01-02 LAB — TROPONIN I (HIGH SENSITIVITY)
Troponin I (High Sensitivity): 110 ng/L (ref <18)
Troponin I (High Sensitivity): 99 ng/L — ABNORMAL HIGH (ref <18)

## 2022-01-02 LAB — LACTIC ACID, PLASMA
Lactic Acid, Venous: 2.5 mmol/L (ref 0.5–1.9)
Lactic Acid, Venous: 1.7 mmol/L (ref 0.5–1.9)

## 2022-01-02 LAB — HIV ANTIBODY (ROUTINE TESTING W REFLEX): HIV Screen 4th Generation wRfx: NONREACTIVE

## 2022-01-02 LAB — CORTISOL: Cortisol, Plasma: 25.2 ug/dL

## 2022-01-02 LAB — RESP PANEL BY RT-PCR (FLU A&B, COVID) ARPGX2
Influenza A by PCR: NEGATIVE
Influenza B by PCR: NEGATIVE
SARS Coronavirus 2 by RT PCR: NEGATIVE

## 2022-01-02 LAB — BLOOD GAS, VENOUS: Patient temperature: 37

## 2022-01-02 LAB — VITAMIN B12: Vitamin B-12: 108 pg/mL — ABNORMAL LOW (ref 180–914)

## 2022-01-02 LAB — D-DIMER, QUANTITATIVE: D-Dimer, Quant: 3.01 ug/mL-FEU — ABNORMAL HIGH (ref 0.00–0.50)

## 2022-01-02 LAB — MRSA NEXT GEN BY PCR, NASAL: MRSA by PCR Next Gen: NOT DETECTED

## 2022-01-02 LAB — OSMOLALITY: Osmolality: 267 mOsm/kg — ABNORMAL LOW (ref 275–295)

## 2022-01-02 LAB — TSH: TSH: 0.629 u[IU]/mL (ref 0.350–4.500)

## 2022-01-02 LAB — BRAIN NATRIURETIC PEPTIDE: B Natriuretic Peptide: 107.2 pg/mL — ABNORMAL HIGH (ref 0.0–100.0)

## 2022-01-02 LAB — ETHANOL: Alcohol, Ethyl (B): <10 mg/dL (ref <10)

## 2022-01-02 MED ORDER — FOLIC ACID 1 MG PO TABS: 1.0000 mg | ORAL_TABLET | Freq: Every day | ORAL | Status: DC

## 2022-01-02 MED ORDER — IOHEXOL 350 MG/ML SOLN
125.0000 mL | Freq: Once | INTRAVENOUS | Status: AC | PRN
  Administered 2022-01-02: 125 mL via INTRAVENOUS

## 2022-01-02 MED ORDER — CHLORHEXIDINE GLUCONATE CLOTH 2 % EX PADS
6.0000 | MEDICATED_PAD | Freq: Every day | CUTANEOUS | Status: DC
  Administered 2022-01-03 – 2022-01-09 (×7): 6 via TOPICAL

## 2022-01-02 MED ORDER — LORAZEPAM 2 MG/ML IJ SOLN
1.0000 mg | INTRAMUSCULAR | Status: AC | PRN
  Administered 2022-01-03 – 2022-01-05 (×5): 2 mg via INTRAVENOUS
  Filled 2022-01-02 (×6): qty 1

## 2022-01-02 MED ORDER — LORAZEPAM 2 MG/ML IJ SOLN
1.0000 mg | Freq: Once | INTRAMUSCULAR | Status: AC
  Administered 2022-01-02: 1 mg via INTRAVENOUS
  Filled 2022-01-02: qty 1

## 2022-01-02 MED ORDER — SODIUM CHLORIDE 0.9 % IV SOLN: INTRAVENOUS | Status: DC | PRN

## 2022-01-02 MED ORDER — ENOXAPARIN SODIUM 40 MG/0.4ML IJ SOSY: 40.0000 mg | PREFILLED_SYRINGE | INTRAMUSCULAR | Status: DC

## 2022-01-02 MED ORDER — SODIUM CHLORIDE 0.9 % IV SOLN
1.0000 g | Freq: Once | INTRAVENOUS | Status: AC
  Administered 2022-01-02: 1 g via INTRAVENOUS
  Filled 2022-01-02: qty 10

## 2022-01-02 MED ORDER — LORAZEPAM 1 MG PO TABS: 1.0000 mg | ORAL_TABLET | ORAL | Status: AC | PRN

## 2022-01-02 MED ORDER — ADULT MULTIVITAMIN W/MINERALS CH
1.0000 | ORAL_TABLET | Freq: Every day | ORAL | Status: DC
  Administered 2022-01-05 – 2022-01-09 (×5): 1 via ORAL
  Filled 2022-01-02 (×5): qty 1

## 2022-01-02 MED ORDER — IPRATROPIUM-ALBUTEROL 0.5-2.5 (3) MG/3ML IN SOLN
3.0000 mL | Freq: Once | RESPIRATORY_TRACT | Status: AC
  Administered 2022-01-02: 3 mL via RESPIRATORY_TRACT
  Filled 2022-01-02: qty 3

## 2022-01-02 MED ORDER — METHYLPREDNISOLONE SODIUM SUCC 125 MG IJ SOLR
125.0000 mg | Freq: Once | INTRAMUSCULAR | Status: AC
  Administered 2022-01-02: 125 mg via INTRAVENOUS
  Filled 2022-01-02: qty 2

## 2022-01-02 MED ORDER — SODIUM CHLORIDE 0.9 % IV SOLN
500.0000 mg | INTRAVENOUS | Status: DC
  Filled 2022-01-02: qty 5

## 2022-01-02 MED ORDER — THIAMINE HCL 100 MG/ML IJ SOLN
500.0000 mg | Freq: Three times a day (TID) | INTRAVENOUS | Status: DC
  Administered 2022-01-02 – 2022-01-05 (×8): 500 mg via INTRAVENOUS
  Filled 2022-01-02 (×9): qty 5

## 2022-01-02 MED ORDER — SODIUM CHLORIDE 0.9 % IV SOLN
2.0000 g | INTRAVENOUS | Status: DC
  Filled 2022-01-02: qty 20

## 2022-01-02 MED ORDER — LORAZEPAM 2 MG/ML IJ SOLN
0.5000 mg | INTRAMUSCULAR | Status: DC
  Administered 2022-01-02 – 2022-01-05 (×14): 0.5 mg via INTRAVENOUS
  Filled 2022-01-02 (×13): qty 1

## 2022-01-02 MED ORDER — SODIUM CHLORIDE 0.9 % IV SOLN
500.0000 mg | Freq: Once | INTRAVENOUS | Status: AC
  Administered 2022-01-02: 500 mg via INTRAVENOUS
  Filled 2022-01-02: qty 5

## 2022-01-02 MED ORDER — LORAZEPAM 2 MG/ML IJ SOLN: 0.5000 mg | INTRAMUSCULAR | Status: DC | PRN

## 2022-01-02 NOTE — Progress Notes (Signed)
Patient back from CT/MRI

## 2022-01-02 NOTE — ED Provider Notes (Signed)
? ?[facility] ?Provider Note ? ? ? Event Date/Time  ? First MD Initiated Contact with Patient 01/02/22 1218   ?  (approximate) ? ? ?History  ? ?Altered mental status, shortness of breath ? ?History limited by altered mental status ?HPI ? ?Michael Padilla is a 66 y.o. male with a reported history of COPD and alcoholism per EMS who presents with altered mental status and shortness of breath.  They did give 2 nebulizers in route and reported improvement.  They do report the patient has been confused and initially was unresponsive reportedly but is looking better now.  Patient is unable to give significant history ? ?  ? ? ?Physical Exam  ? ?Triage Vital Signs: ?ED Triage Vitals  ?Enc Vitals Group  ?   BP   ?   Pulse   ?   Resp   ?   Temp   ?   Temp src   ?   SpO2   ?   Weight   ?   Height   ?   Head Circumference   ?   Peak Flow   ?   Pain Score   ?   Pain Loc   ?   Pain Edu?   ?   Excl. in GC?   ? ? ?Most recent vital signs: ?Vitals:  ? 01/02/22 1400 01/02/22 1430  ?BP: 139/82 (!) 148/94  ?Pulse: (!) 103 (!) 105  ?Resp: (!) 21 (!) 22  ?Temp:    ?SpO2: 90% 93%  ? ? ? ?General: Awake, confused ?CV:  Good peripheral perfusion.  ?Resp:  Increased respiratory effort with scattered wheezing ?Abd:  No distention, no tenderness palpation ?Other:  No calf pain or lower extremity edema ? ? ?ED Results / Procedures / Treatments  ? ?Labs ?(all labs ordered are listed, but only abnormal results are displayed) ?Labs Reviewed  ?CBC - Abnormal; Notable for the following components:  ?    Result Value  ? WBC 17.7 (*)   ? Platelets 605 (*)   ? All other components within normal limits  ?COMPREHENSIVE METABOLIC PANEL - Abnormal; Notable for the following components:  ? Sodium 124 (*)   ? Chloride 89 (*)   ? Glucose, Bld 155 (*)   ? BUN 6 (*)   ? Creatinine, Ser 0.56 (*)   ? Calcium 8.5 (*)   ? All other components within normal limits  ?LACTIC ACID, PLASMA - Abnormal; Notable for the following components:  ?  Lactic Acid, Venous 2.5 (*)   ? All other components within normal limits  ?CULTURE, BLOOD (SINGLE)  ?RESP PANEL BY RT-PCR (FLU A&B, COVID) ARPGX2  ?ETHANOL  ?BLOOD GAS, VENOUS  ?URINALYSIS, COMPLETE (UACMP) WITH MICROSCOPIC  ?LACTIC ACID, PLASMA  ?BRAIN NATRIURETIC PEPTIDE  ? ? ? ?EKG ? ?ED ECG REPORT ?I, Jene Every, the attending physician, personally viewed and interpreted this ECG. ? ?Date: 01/02/2022 ? ?Rhythm: Sinus tachycardia ?QRS Axis: normal ?Intervals: normal ?ST/T Wave abnormalities: normal ?Narrative Interpretation: no evidence of acute ischemia ? ? ? ?RADIOLOGY ?Chest x-ray viewed interpreted by me, no acute abnormality ?CT head without acute abnormality ? ? ?PROCEDURES: ? ?Critical Care performed: yes ? ?CRITICAL CARE ?Performed by: Jene Every ? ? ?Total critical care time: 34 minutes ? ?Critical care time was exclusive of separately billable procedures and treating other patients. ? ?Critical care was necessary to treat or prevent imminent or life-threatening deterioration. ? ?Critical care was time spent personally by me on  the following activities: development of treatment plan with patient and/or surrogate as well as nursing, discussions with consultants, evaluation of patient's response to treatment, examination of patient, obtaining history from patient or surrogate, ordering and performing treatments and interventions, ordering and review of laboratory studies, ordering and review of radiographic studies, pulse oximetry and re-evaluation of patient's condition. ? ? ?Procedures ? ? ?MEDICATIONS ORDERED IN ED: ?Medications  ?azithromycin (ZITHROMAX) 500 mg in sodium chloride 0.9 % 250 mL IVPB (500 mg Intravenous New Bag/Given 01/02/22 1419)  ?methylPREDNISolone sodium succinate (SOLU-MEDROL) 125 mg/2 mL injection 125 mg (125 mg Intravenous Given 01/02/22 1314)  ?ipratropium-albuterol (DUONEB) 0.5-2.5 (3) MG/3ML nebulizer solution 3 mL (3 mLs Nebulization Given 01/02/22 1254)   ?ipratropium-albuterol (DUONEB) 0.5-2.5 (3) MG/3ML nebulizer solution 3 mL (3 mLs Nebulization Given 01/02/22 1416)  ?cefTRIAXone (ROCEPHIN) 1 g in sodium chloride 0.9 % 100 mL IVPB (0 g Intravenous Stopped 01/02/22 1450)  ?LORazepam (ATIVAN) injection 1 mg (1 mg Intravenous Given 01/02/22 1430)  ? ? ? ?IMPRESSION / MDM / ASSESSMENT AND PLAN / ED COURSE  ?I reviewed the triage vital signs and the nursing notes. ? ? ?Patient presents with altered mental status, shortness of breath.  Differential is extensive, possibility of elevated CO2 causing confusion, alcoholism.  Review of record demonstrates the patient has had significant hyponatremia in the past that could also cause confusion.  Pending labs, imaging. ? ?Patient is hypoxic and requiring nasal cannula oxygen ? ?Work is notable for significant hyponatremia, sodium of 124, lactic acid is elevated, white blood cell count is elevated 17.7, concerning for pneumonia however no clear object seen on chest x-ray, will send for CT and start IV antibiotics ? ?VBG does not demonstrate significantly elevated CO2 ? ?Did discuss with the hospitalist for admission ? ? ?  ? ? ?FINAL CLINICAL IMPRESSION(S) / ED DIAGNOSES  ? ?Final diagnoses:  ?Community acquired pneumonia, unspecified laterality  ?Hypoxia  ?Hyponatremia  ?Altered mental status, unspecified altered mental status type  ? ? ? ?Rx / DC Orders  ? ?ED Discharge Orders   ? ? None  ? ?  ? ? ? ?Note:  This document was prepared using Dragon voice recognition software and may include unintentional dictation errors. ?  ?Jene Every, MD ?01/02/22 1459 ? ?

## 2022-01-02 NOTE — ED Triage Notes (Signed)
Pt BIB [facility] from home after being found unresponsive by son. Per EMS, pt combative on scene but did become responsive. Pt incontinent of urine prior to EMS arrival to scene. Hx COPD. ?

## 2022-01-02 NOTE — ED Notes (Signed)
Dr. Corky Downs notified of critical lactic acid 2.5.  ?

## 2022-01-02 NOTE — H&P (Addendum)
History and Physical    JARRETT AMESBURY JXB:[id number] DOB: 12/09/1955 DOA: 01/02/2022  PCP: Patient, No Pcp Per (Inactive)  Patient coming from: Home.  History obtained from patient's son Mr. Apolinar Junes who can be reached at [phone] or [phone].  Chief Complaint: Altered mental status unresponsive episode.  HPI: OCIE KUPER is a 66 y.o. male with history of alcohol abuse last drink yesterday, tobacco abuse COPD recently admitted for diarrhea and COVID about a month ago was doing fine until yesterday when patient's son checked on him this morning at around 10 AM when patient was initially unresponsive and later became more confused and at that time EMS was called.  Patient also was found to be short of breath taking deep breaths.  ED Course: In the ER patient is encephalopathic but waking up on calling his name.  Does not follow commands.  CT head does not show anything acute.  Labs show sodium of 124 at discharge last month it was 128.  Has chronic hyponatremia.  Does show leukocytosis of 17,000.  Platelets of 605.  Lactic acid was 2.5 BNP 107 CT chest shows nothing acute except for T12 compression fracture.  Patient was given antibiotics steroids and nebulizer COPD VBG does not show any carbon dioxide retention.  Alcohol and COVID test is negative.  Patient admitted for acute encephalopathy unresponsive episode shortness of breath.    Review of Systems: As per HPI, rest all negative.   Past Medical History:  Diagnosis Date   Arthritis    COPD (chronic obstructive pulmonary disease) (HCC)     Past Surgical History:  Procedure Laterality Date   none       reports that he has been smoking cigarettes. He has been smoking an average of 2 packs per day. He has never used smokeless tobacco. He reports current alcohol use of about 29.0 standard drinks per week. He reports that he does not use drugs.  No Known Allergies  Family History  Problem Relation Age of Onset   Hypertension  Mother     Prior to Admission medications   Medication Sig Start Date End Date Taking? Authorizing Provider  Fluticasone-Umeclidin-Vilant (TRELEGY ELLIPTA) 100-62.5-25 MCG/INH AEPB Inhale 1 puff into the lungs daily. Patient not taking: Reported on 01/02/2022 11/21/20   Marrion Coy, MD  Ipratropium-Albuterol (COMBIVENT) 20-100 MCG/ACT AERS respimat Inhale 1 puff into the lungs every 6 (six) hours as needed for wheezing. Patient not taking: Reported on 01/02/2022 11/21/20   Marrion Coy, MD  metoprolol tartrate (LOPRESSOR) 50 MG tablet Take 1 tablet (50 mg total) by mouth 2 (two) times daily. Patient not taking: Reported on 01/02/2022 11/24/20   Marrion Coy, MD    Physical Exam: Constitutional: Moderately built and nourished. Vitals:   01/02/22 1330 01/02/22 1341 01/02/22 1400 01/02/22 1430  BP: (!) 149/90  139/82 (!) 148/94  Pulse: (!) 101 (!) 103 (!) 103 (!) 105  Resp: (!) 27 19 (!) 21 (!) 22  Temp:      TempSrc:      SpO2: (!) 88% 95% 90% 93%   Eyes: Anicteric no pallor. ENMT: No discharge from the ears eyes nose and mouth. Neck: No mass felt.  No neck rigidity. Respiratory: No rhonchi or crepitations. Cardiovascular: S1-S2 heard. Abdomen: Soft nontender bowel sound present. Musculoskeletal: No edema. Skin: No rash. Neurologic: Patient is confused responding to his name pupils are equal and reacting to light.  He is moving all extremities but does not follow commands. Psychiatric: Encephalopathic.  Labs on Admission: I have personally reviewed following labs and imaging studies  CBC: Recent Labs  Lab 01/02/22 1229  WBC 17.7*  HGB 14.5  HCT 42.7  MCV 97.0  PLT 605*   Basic Metabolic Panel: Recent Labs  Lab 01/02/22 1229  NA 124*  K 3.8  CL 89*  CO2 25  GLUCOSE 155*  BUN 6*  CREATININE 0.56*  CALCIUM 8.5*   GFR: CrCl cannot be calculated (Unknown ideal weight.). Liver Function Tests: Recent Labs  Lab 01/02/22 1229  AST 33  ALT 17  ALKPHOS 104  BILITOT  1.2  PROT 7.2  ALBUMIN 3.9   No results for input(s): LIPASE, AMYLASE in the last 168 hours. No results for input(s): AMMONIA in the last 168 hours. Coagulation Profile: No results for input(s): INR, PROTIME in the last 168 hours. Cardiac Enzymes: No results for input(s): CKTOTAL, CKMB, CKMBINDEX, TROPONINI in the last 168 hours. BNP (last 3 results) No results for input(s): PROBNP in the last 8760 hours. HbA1C: No results for input(s): HGBA1C in the last 72 hours. CBG: No results for input(s): GLUCAP in the last 168 hours. Lipid Profile: No results for input(s): CHOL, HDL, LDLCALC, TRIG, CHOLHDL, LDLDIRECT in the last 72 hours. Thyroid Function Tests: No results for input(s): TSH, T4TOTAL, FREET4, T3FREE, THYROIDAB in the last 72 hours. Anemia Panel: No results for input(s): VITAMINB12, FOLATE, FERRITIN, TIBC, IRON, RETICCTPCT in the last 72 hours. Urine analysis:    Component Value Date/Time   COLORURINE YELLOW (A) 11/19/2020 0846   APPEARANCEUR HAZY (A) 11/19/2020 0846   LABSPEC 1.019 11/19/2020 0846   PHURINE 6.0 11/19/2020 0846   GLUCOSEU 50 (A) 11/19/2020 0846   HGBUR NEGATIVE 11/19/2020 0846   BILIRUBINUR NEGATIVE 11/19/2020 0846   KETONESUR NEGATIVE 11/19/2020 0846   PROTEINUR NEGATIVE 11/19/2020 0846   NITRITE NEGATIVE 11/19/2020 0846   LEUKOCYTESUR NEGATIVE 11/19/2020 0846   Sepsis Labs: @LABRCNTIP (procalcitonin:4,lacticidven:4) )No results found for this or any previous visit (from the past 240 hour(s)).   Radiological Exams on Admission: CT HEAD WO CONTRAST ( )  Result Date: 01/02/2022 CLINICAL DATA:  66 year old male found down.  Altered mental status. EXAM: CT HEAD WITHOUT CONTRAST TECHNIQUE: Contiguous axial images were obtained from the base of the skull through the vertex without intravenous contrast. RADIATION DOSE REDUCTION: This exam was performed according to the departmental dose-optimization program which includes automated exposure control,  adjustment of the mA and/or kV according to patient size and/or use of iterative reconstruction technique. COMPARISON:  No priors. FINDINGS: Brain: Patchy and confluent areas of decreased attenuation are noted throughout the deep and periventricular white matter of the cerebral hemispheres bilaterally, compatible with chronic microvascular ischemic disease. No evidence of acute infarction, hemorrhage, hydrocephalus, extra-axial collection or mass lesion/mass effect. Vascular: No hyperdense vessel or unexpected calcification. Skull: Normal. Negative for fracture or focal lesion. Sinuses/Orbits: No acute finding. Other: None. IMPRESSION: 1. No acute intracranial abnormalities. 2. Chronic microvascular ischemic changes in the cerebral white matter, as above. Electronically Signed   By: Trudie Reed M.D.   On: 01/02/2022 13:13   CT Chest Wo Contrast  Result Date: 01/02/2022 CLINICAL DATA:  Shortness of breath EXAM: CT CHEST WITHOUT CONTRAST TECHNIQUE: Multidetector CT imaging of the chest was performed following the standard protocol without IV contrast. RADIATION DOSE REDUCTION: This exam was performed according to the departmental dose-optimization program which includes automated exposure control, adjustment of the mA and/or kV according to patient size and/or use of iterative reconstruction technique. COMPARISON:  01/02/2022 chest  radiograph FINDINGS: Cardiovascular: Atherosclerotic calcifications aorta, proximal great vessels, and coronary arteries. Aorta normal caliber. Heart size normal. No pericardial effusion. Small amount of pericardial thickening and calcification noted. Mediastinum/Nodes: Esophagus unremarkable. Base of cervical region normal appearance. No thoracic adenopathy. Dense 7 mm calcification RIGHT thyroid lobe. Lungs/Pleura: Severe emphysematous changes greatest in upper lobes. No pulmonary infiltrate, pleural effusion, or pneumothorax. Minimal subsegmental atelectasis RIGHT lower lobe. No  mass. Upper Abdomen: Visualized upper abdomen unremarkable Musculoskeletal: Diffuse osseous demineralization. Compression deformities of adjacent lower thoracic vertebra, approximately T11 and T12, greater at T12 new since 12/04/2019. IMPRESSION: Scattered atherosclerotic calcifications including coronary arteries. Minimal subsegmental atelectasis RIGHT lower lobe without evidence of acute infiltrate. Compression deformities of adjacent lower thoracic vertebra, greater at T12. Aortic Atherosclerosis (ICD10-I70.0) and Emphysema (ICD10-J43.9). Electronically Signed   By: Ulyses Southward M.D.   On: 01/02/2022 14:40   DG Chest Port 1 View  Result Date: 01/02/2022 CLINICAL DATA:  Shortness of breath EXAM: PORTABLE CHEST 1 VIEW COMPARISON:  11/20/2020 FINDINGS: Normal heart size. Aortic tortuosity accentuated by rotation. Diffuse interstitial coarsening similar to priors and attributed to COPD. Aeration at the bases is better than before. No effusion or pneumothorax. IMPRESSION: COPD without acute superimposed finding. Electronically Signed   By: Tiburcio Pea M.D.   On: 01/02/2022 12:45    EKG: Independently reviewed.  Sinus tachycardia.  Assessment/Plan Principal Problem:   Acute encephalopathy Active Problems:   Hyponatremia   COPD with acute exacerbation (HCC)   Unresponsive episode    Acute encephalopathy -differentials include alcohol withdrawal, possible postictal, Wernicke's encephalopathy and could also be from hyponatremia.  Patient at this time is afebrile.  Will check MRI brain and EEG thiamine level, B12 TSH, ammonia levels, urine drug screen, UA.  I do not think patient is an active seizures since patient is responding to calling his name.  I am repeating a metabolic panel to see the direction of the sodium.  If patient continues to remain hyponatremic may need 3% sodium chloride.  If encephalopathy persists will need lumbar puncture.  Addendum -I have consulted neurology due to patient's  encephalopathy worsening. Hyponatremia appears to be chronic recent sodium about a month ago was 128.  Likely could be from alcohol abuse.  We will check urine sodium as moderately serum osmolality check serial metabolic panel to see sodium trends.  If there is any further decline in sodium, patient mentation does not improve will give 3% sodium chloride bolus.  In addition we will check TSH and cortisol. Alcohol abuse we will keep patient on CIWA protocol.  Check thiamine level to make sure patient is not having Wernicke's encephalopathy.  Following which we will start patient on large doses of thiamine.  Ativan protocol. COPD at the time of my exam patient not actively wheezing we will keep patient on as needed nebulizer but does have productive cough for which we will continue antibiotics.  Follow cultures.   DVT prophylaxis: SCDs.  We will avoid anticoagulation in anticipation of possible lumbar puncture necessary if mentation does not improve. Code Status: Full code. Family Communication: Patient's son. Disposition Plan: To be determined. Consults called: Neurology. Admission status: Observation plan   Eduard Clos MD [facility] Pager [phone].  If 7PM-7AM, please contact night-coverage [email or web address] Password [facility]  01/02/2022, 3:25 PM

## 2022-01-02 NOTE — Progress Notes (Signed)
Patient down to CT/MRI with this RN and transport. ?

## 2022-01-02 NOTE — ED Notes (Signed)
BLADDER SCANNER OUT OF PAPER  ? ?BLADDER SCAN COMPLETED more than 200 ML urine in bladder. No output has been collected in suction external cath.  ?

## 2022-01-02 NOTE — Consult Note (Signed)
NEURO HOSPITALIST CONSULT NOTE   Requesting physician: Dr. Toniann FailKakrakandy  Reason for Consult: Altered mental status  History obtained from:  Chart     HPI:                                                                                                                                          Michael Padilla is an 66 y.o. male who with a history of heavy EtOH use, tobacco abuse, chronic hyponatremia, COPD and arthritis who was BIB [facility] from home after being found unresponsive by his son at 6210 AM. Son was able to arouse the patient to a confused state. Per EMS, the patient was combative on scene but did become responsive. He was noted to be incontinent of urine by son. EMS noted SOB for which 2 nebulizers were given en route, with reported improvement. Per family, the patient's last drink was yesterday. LKN 10 PM per son.   CT head does not show anything acute. Labs with hyponatremia; Na 124 which is lower than it was at discharge last month (128). WBC elevated at 17,000. Lactate 2.5. BNP 107. VBG without any carbon dioxide retention. COVID test is negative.    Blood alcohol level < 10.   Past Medical History:  Diagnosis Date   Arthritis    COPD (chronic obstructive pulmonary disease) (HCC)     Past Surgical History:  Procedure Laterality Date   none      Family History  Problem Relation Age of Onset   Hypertension Mother              Social History:  reports that he has been smoking cigarettes. He has been smoking an average of 2 packs per day. He has never used smokeless tobacco. He reports current alcohol use of about 29.0 standard drinks per week. He reports that he does not use drugs.  No Known Allergies  HOME MEDICATIONS:                                                                                                                      No current facility-administered medications on file prior to encounter.   Current Outpatient Medications on File  Prior to Encounter  Medication Sig Dispense Refill   Fluticasone-Umeclidin-Vilant (TRELEGY ELLIPTA)  100-62.5-25 MCG/INH AEPB Inhale 1 puff into the lungs daily. (Patient not taking: Reported on 01/02/2022) 28 each 0   Ipratropium-Albuterol (COMBIVENT) 20-100 MCG/ACT AERS respimat Inhale 1 puff into the lungs every 6 (six) hours as needed for wheezing. (Patient not taking: Reported on 01/02/2022) 4 g 0   metoprolol tartrate (LOPRESSOR) 50 MG tablet Take 1 tablet (50 mg total) by mouth 2 (two) times daily. (Patient not taking: Reported on 01/02/2022) 30 tablet 0     ROS:                                                                                                                                       Unable to obtain due to AMS.    Blood pressure (!) 142/98, pulse (!) 109, temperature 97.9 F (36.6 C), temperature source Oral, resp. rate (!) 35, height 5\' 8"  (1.727 m), weight 63.5 kg, SpO2 98 %.   General Examination:                                                                                                       Physical Exam  HEENT-  Normocephalic  Lungs- Mild tachypnea Extremities- No edema  Neurological Examination Mental Status: Initially asleep, the patient awakens to a transiently hypervigilant state after sternal rub, then rapidly falls asleep again. Requires repeated noxious stimuli to awaken each time he falls asleep. Only verbal output is 2-3 word dysarthric exclamations. Does not answer questions or follow any commands.  Cranial Nerves: II: PERRL. Briefly fixates on examiner's face after noxious. Inconsistent blink to threat.   III,IV, VI: No ptosis. Saccadic pursuits noted when looking from right to left and then back to the right as he visually tracks examiner. Eyes are conjugate. No nystagmus.  V: Reacts to stimuli bilaterally VII: Face is grossly symmetric VIII: Unable to formally assess IX,X: Exclamations are not hypophonic XI: Grossly symmetric XII: Does not  protrude tongue to command Motor/Sensory: Not following any motor commands.  Grabs for sternum with LUE consistently in response to sternal rub.  Less brisk movement of RUE to noxious, which does not move antigravity Reacts to pinch BUE by exclaiming.  BLE flexed at hips and knees when examiner enters the room. He will move them and exclaim in resopnse to noxious without gross asymmetry.  Deep Tendon Reflexes: 2+ bilateral biceps and brachioradialis. Trace patellar reflexes with crossed adductor responses bilaterally.  Plantars: Equivocal bilaterally Cerebellar: Unable to assess Gait: Deferred   Lab Results: Basic Metabolic  Panel: Recent Labs  Lab 01/02/22 1229  NA 124*  K 3.8  CL 89*  CO2 25  GLUCOSE 155*  BUN 6*  CREATININE 0.56*  CALCIUM 8.5*    CBC: Recent Labs  Lab 01/02/22 1229  WBC 17.7*  HGB 14.5  HCT 42.7  MCV 97.0  PLT 605*    Cardiac Enzymes: No results for input(s): CKTOTAL, CKMB, CKMBINDEX, TROPONINI in the last 168 hours.  Lipid Panel: No results for input(s): CHOL, TRIG, HDL, CHOLHDL, VLDL, LDLCALC in the last 168 hours.  Imaging: CT HEAD WO CONTRAST ( )  Result Date: 01/02/2022 CLINICAL DATA:  66 year old male found down.  Altered mental status. EXAM: CT HEAD WITHOUT CONTRAST TECHNIQUE: Contiguous axial images were obtained from the base of the skull through the vertex without intravenous contrast. RADIATION DOSE REDUCTION: This exam was performed according to the departmental dose-optimization program which includes automated exposure control, adjustment of the mA and/or kV according to patient size and/or use of iterative reconstruction technique. COMPARISON:  No priors. FINDINGS: Brain: Patchy and confluent areas of decreased attenuation are noted throughout the deep and periventricular white matter of the cerebral hemispheres bilaterally, compatible with chronic microvascular ischemic disease. No evidence of acute infarction, hemorrhage,  hydrocephalus, extra-axial collection or mass lesion/mass effect. Vascular: No hyperdense vessel or unexpected calcification. Skull: Normal. Negative for fracture or focal lesion. Sinuses/Orbits: No acute finding. Other: None. IMPRESSION: 1. No acute intracranial abnormalities. 2. Chronic microvascular ischemic changes in the cerebral white matter, as above. Electronically Signed   By: Trudie Reed M.D.   On: 01/02/2022 13:13   CT Chest Wo Contrast  Result Date: 01/02/2022 CLINICAL DATA:  Shortness of breath EXAM: CT CHEST WITHOUT CONTRAST TECHNIQUE: Multidetector CT imaging of the chest was performed following the standard protocol without IV contrast. RADIATION DOSE REDUCTION: This exam was performed according to the departmental dose-optimization program which includes automated exposure control, adjustment of the mA and/or kV according to patient size and/or use of iterative reconstruction technique. COMPARISON:  01/02/2022 chest radiograph FINDINGS: Cardiovascular: Atherosclerotic calcifications aorta, proximal great vessels, and coronary arteries. Aorta normal caliber. Heart size normal. No pericardial effusion. Small amount of pericardial thickening and calcification noted. Mediastinum/Nodes: Esophagus unremarkable. Base of cervical region normal appearance. No thoracic adenopathy. Dense 7 mm calcification RIGHT thyroid lobe. Lungs/Pleura: Severe emphysematous changes greatest in upper lobes. No pulmonary infiltrate, pleural effusion, or pneumothorax. Minimal subsegmental atelectasis RIGHT lower lobe. No mass. Upper Abdomen: Visualized upper abdomen unremarkable Musculoskeletal: Diffuse osseous demineralization. Compression deformities of adjacent lower thoracic vertebra, approximately T11 and T12, greater at T12 new since 12/04/2019. IMPRESSION: Scattered atherosclerotic calcifications including coronary arteries. Minimal subsegmental atelectasis RIGHT lower lobe without evidence of acute infiltrate.  Compression deformities of adjacent lower thoracic vertebra, greater at T12. Aortic Atherosclerosis (ICD10-I70.0) and Emphysema (ICD10-J43.9). Electronically Signed   By: Ulyses Southward M.D.   On: 01/02/2022 14:40   DG Chest Port 1 View  Result Date: 01/02/2022 CLINICAL DATA:  Shortness of breath EXAM: PORTABLE CHEST 1 VIEW COMPARISON:  11/20/2020 FINDINGS: Normal heart size. Aortic tortuosity accentuated by rotation. Diffuse interstitial coarsening similar to priors and attributed to COPD. Aeration at the bases is better than before. No effusion or pneumothorax. IMPRESSION: COPD without acute superimposed finding. Electronically Signed   By: Tiburcio Pea M.D.   On: 01/02/2022 12:45    Assessment: 67 year old male alcoholic presenting with AMS after initially being found unresponsive by family 1. Exam reveals findings most consistent with a delirium. Also with less  movement of RUE than left, which in conjunction with his speech deficit may represent a lesional aphasia. No stroke on CT but MRI should be obtained to rule out. Due to unknown LKN thrombectomy cannot be performed.  Chronic hyponatremia with worsening today compared to last month. DDx includes beer potomania.  2. Ammonia level normal at 30.  3. Estimated GFR of > 60.  4. Lactate normal at 1.7 5. Elevated WBC at 17.7. Evaluate for possible infection.  6. Thrombocytopenia on CBC. 7. DDx for presentation includes severe Wernicke encephalopathy, delirium tremens, unwitnessed EtOH withdrawal seizure with postictal state and left perisylvian stroke. He has no documented history of seizures, but his hyponatremia may have lowered the seizure threshold.   Recommendations: 1. Agree with obtaining STAT thiamine level followed by high-dose thiamine supplementation 2. STAT CTA of head and neck 3. STAT EEG 4. Toxic/metabolic work up 5. MRI brain when able.  6. ID work up per primary team 7. Management of thrombocytopenia per primary team.  8.  CIWA protocol with PRN Ativan. Also will need scheduled Ativan 0.5 mg IV q4h for now.  9. Management of hyponatremia per primary team.  10. Discussed with Dr. Toniann Fail  Electronically signed: Dr. Caryl Pina 01/02/2022, 4:01 PM

## 2022-01-02 NOTE — ED Notes (Signed)
Pt transported to CT via stretcher.  

## 2022-01-02 NOTE — Progress Notes (Signed)
Patient alert to voice, ST, on 2 L of O2. Patient is unable to follow commands, with purposeful movement. Called CT, CT requested patient come after their shift change. EEG was being done when CT called back, will pass on in report to obtain CT after EEG. Patient was given PRN medications in ED, patient is currently resting.  ?

## 2022-01-02 NOTE — ED Notes (Signed)
Pt noted to be less responsive, only responsive to painful stimuli at this time. This RN messaged MD regarding same and concern for airway protection during ordered MRI. Dr. Toniann Fail to bedside to reassess patient. Will hold on MRI until pt is more stable.  ?

## 2022-01-02 NOTE — Progress Notes (Signed)
Eeg done 

## 2022-01-02 NOTE — Procedures (Signed)
Patient Name: Michael Padilla  ?MRN: [id number]  ?Epilepsy Attending: Lora Havens  ?Referring Physician/Provider: Kerney Elbe, MD ?Date: 01/02/2022 ?Duration: 20.42 mins ? ?Patient history: 66 year old male alcoholic presenting with AMS after initially being found unresponsive by family. EEG to evaluate for seizure. ? ?Level of alertness: Awake, asleep ? ?AEDs during EEG study: Ativan ? ?Technical aspects: This EEG study was done with scalp electrodes positioned according to the 10-20 International system of electrode placement. Electrical activity was acquired at a sampling rate of 500Hz  and reviewed with a high frequency filter of 70Hz  and a low frequency filter of 1Hz . EEG data were recorded continuously and digitally stored.  ? ?Description: No clear posterior dominant rhythm was seen. Sleep was characterized by vertex waves, sleep spindles (12 to 14 Hz), maximal frontocentral region. EEG showed continuous generalized 3-6hz  theta-delta slowing admixed with an excessive amount of 15 to 18 Hz beta activity distributed symmetrically and diffusely. Hyperventilation and photic stimulation were not performed.    ? ?ABNORMALITY ?- Continuous slow, generalized ?- Excessive beta, generalized ? ?IMPRESSION: ?This study is suggestive of moderate diffuse encephalopathy, non specific etiology. The excessive beta activity seen in the background is most likely due to the effect of benzodiazepine and is a benign EEG pattern. No seizures or epileptiform discharges were seen throughout the recording. ? ?Lora Havens  ? ?

## 2022-01-03 ENCOUNTER — Inpatient Hospital Stay (HOSPITAL_COMMUNITY)
Admit: 2022-01-03 | Discharge: 2022-01-03 | Disposition: A | Discharge status: Home / Self Care | Payer: Medicare HMO | Attending: Neurology | Admitting: Neurology

## 2022-01-03 DIAGNOSIS — E871 Hypo-osmolality and hyponatremia: Secondary | ICD-10-CM

## 2022-01-03 DIAGNOSIS — G9341 Metabolic encephalopathy: Secondary | ICD-10-CM | POA: Diagnosis not present

## 2022-01-03 DIAGNOSIS — N3000 Acute cystitis without hematuria: Secondary | ICD-10-CM

## 2022-01-03 DIAGNOSIS — I6389 Other cerebral infarction: Secondary | ICD-10-CM | POA: Diagnosis not present

## 2022-01-03 DIAGNOSIS — I639 Cerebral infarction, unspecified: Secondary | ICD-10-CM | POA: Diagnosis present

## 2022-01-03 DIAGNOSIS — N390 Urinary tract infection, site not specified: Secondary | ICD-10-CM | POA: Diagnosis present

## 2022-01-03 DIAGNOSIS — F1010 Alcohol abuse, uncomplicated: Secondary | ICD-10-CM | POA: Diagnosis present

## 2022-01-03 LAB — URINE DRUG SCREEN, QUALITATIVE (ARMC ONLY)
Amphetamines, Ur Screen: NOT DETECTED
Barbiturates, Ur Screen: NOT DETECTED
Benzodiazepine, Ur Scrn: NOT DETECTED
Cannabinoid 50 Ng, Ur ~~LOC~~: NOT DETECTED
Cocaine Metabolite,Ur ~~LOC~~: NOT DETECTED
MDMA (Ecstasy)Ur Screen: NOT DETECTED
Methadone Scn, Ur: NOT DETECTED
Opiate, Ur Screen: NOT DETECTED
Phencyclidine (PCP) Ur S: NOT DETECTED
Tricyclic, Ur Screen: NOT DETECTED

## 2022-01-03 LAB — BLOOD GAS, ARTERIAL
Acid-Base Excess: 3.2 mmol/L — ABNORMAL HIGH (ref 0.0–2.0)
Bicarbonate: 28.5 mmol/L — ABNORMAL HIGH (ref 20.0–28.0)
FIO2: 0.44 %
O2 Saturation: 100 %
Patient temperature: 37
pCO2 arterial: 45 mmHg (ref 32–48)
pH, Arterial: 7.41 (ref 7.35–7.45)
pO2, Arterial: 102 mmHg (ref 83–108)

## 2022-01-03 LAB — URINALYSIS, COMPLETE (UACMP) WITH MICROSCOPIC
Bacteria, UA: NONE SEEN
Bilirubin Urine: NEGATIVE
Glucose, UA: NEGATIVE mg/dL
Ketones, ur: 20 mg/dL — AB
Nitrite: POSITIVE — AB
Protein, ur: NEGATIVE mg/dL
Specific Gravity, Urine: >1.046 — ABNORMAL HIGH (ref 1.005–1.030)
Squamous Epithelial / HPF: NONE SEEN (ref 0–5)
pH: 6 (ref 5.0–8.0)

## 2022-01-03 LAB — HEPATIC FUNCTION PANEL
ALT: 15 U/L (ref 0–44)
AST: 28 U/L (ref 15–41)
Albumin: 3.6 g/dL (ref 3.5–5.0)
Alkaline Phosphatase: 90 U/L (ref 38–126)
Bilirubin, Direct: 0.2 mg/dL (ref 0.0–0.2)
Indirect Bilirubin: 0.5 mg/dL (ref 0.3–0.9)
Total Bilirubin: 0.7 mg/dL (ref 0.3–1.2)
Total Protein: 7.1 g/dL (ref 6.5–8.1)

## 2022-01-03 LAB — GLUCOSE, CAPILLARY
Glucose-Capillary: 116 mg/dL — ABNORMAL HIGH (ref 70–99)
Glucose-Capillary: 122 mg/dL — ABNORMAL HIGH (ref 70–99)
Glucose-Capillary: 115 mg/dL — ABNORMAL HIGH (ref 70–99)
Glucose-Capillary: 105 mg/dL — ABNORMAL HIGH (ref 70–99)
Glucose-Capillary: 90 mg/dL (ref 70–99)

## 2022-01-03 LAB — CBC
HCT: 43 % (ref 39.0–52.0)
Hemoglobin: 14.8 g/dL (ref 13.0–17.0)
MCH: 33 pg (ref 26.0–34.0)
MCHC: 34.4 g/dL (ref 30.0–36.0)
MCV: 96 fL (ref 80.0–100.0)
Platelets: 561 10*3/uL — ABNORMAL HIGH (ref 150–400)
RBC: 4.48 MIL/uL (ref 4.22–5.81)
RDW: 13.7 % (ref 11.5–15.5)
WBC: 12.9 10*3/uL — ABNORMAL HIGH (ref 4.0–10.5)
nRBC: 0 % (ref 0.0–0.2)

## 2022-01-03 LAB — BASIC METABOLIC PANEL
Anion gap: 11 (ref 5–15)
Anion gap: 9 (ref 5–15)
BUN: 5 mg/dL — ABNORMAL LOW (ref 8–23)
BUN: 8 mg/dL (ref 8–23)
CO2: 24 mmol/L (ref 22–32)
CO2: 27 mmol/L (ref 22–32)
Calcium: 8.6 mg/dL — ABNORMAL LOW (ref 8.9–10.3)
Calcium: 8.7 mg/dL — ABNORMAL LOW (ref 8.9–10.3)
Chloride: 91 mmol/L — ABNORMAL LOW (ref 98–111)
Chloride: 93 mmol/L — ABNORMAL LOW (ref 98–111)
Creatinine, Ser: 0.4 mg/dL — ABNORMAL LOW (ref 0.61–1.24)
Creatinine, Ser: 0.59 mg/dL — ABNORMAL LOW (ref 0.61–1.24)
GFR, Estimated: >60 mL/min (ref >60)
GFR, Estimated: >60 mL/min (ref >60)
Glucose, Bld: 133 mg/dL — ABNORMAL HIGH (ref 70–99)
Glucose, Bld: 155 mg/dL — ABNORMAL HIGH (ref 70–99)
Potassium: 4 mmol/L (ref 3.5–5.1)
Potassium: 4.2 mmol/L (ref 3.5–5.1)
Sodium: 127 mmol/L — ABNORMAL LOW (ref 135–145)
Sodium: 128 mmol/L — ABNORMAL LOW (ref 135–145)

## 2022-01-03 LAB — LDL CHOLESTEROL, DIRECT: Direct LDL: 33.2 mg/dL (ref 0–99)

## 2022-01-03 LAB — ECHOCARDIOGRAM COMPLETE BUBBLE STUDY
AR max vel: 3.06 cm2
AV Area VTI: 3.19 cm2
AV Area mean vel: 2.85 cm2
AV Mean grad: 1 mmHg
AV Peak grad: 2.5 mmHg
Ao pk vel: 0.8 m/s
Area-P 1/2: 6.22 cm2
S' Lateral: 2.87 cm

## 2022-01-03 LAB — RPR: RPR Ser Ql: NONREACTIVE

## 2022-01-03 LAB — OSMOLALITY, URINE: Osmolality, Ur: 393 mOsm/kg (ref 300–900)

## 2022-01-03 LAB — SODIUM, URINE, RANDOM: Sodium, Ur: <10 mmol/L

## 2022-01-03 LAB — PROCALCITONIN: Procalcitonin: 0.33 ng/mL

## 2022-01-03 MED ORDER — IPRATROPIUM-ALBUTEROL 0.5-2.5 (3) MG/3ML IN SOLN
3.0000 mL | Freq: Four times a day (QID) | RESPIRATORY_TRACT | Status: DC | PRN
  Administered 2022-01-03: 3 mL via RESPIRATORY_TRACT
  Filled 2022-01-03: qty 3

## 2022-01-03 MED ORDER — SODIUM CHLORIDE 0.9 % IV SOLN
3.0000 g | Freq: Four times a day (QID) | INTRAVENOUS | Status: DC
  Administered 2022-01-03 – 2022-01-07 (×16): 3 g via INTRAVENOUS
  Filled 2022-01-03: qty 8
  Filled 2022-01-03 (×3): qty 3
  Filled 2022-01-03 (×6): qty 8
  Filled 2022-01-03: qty 3
  Filled 2022-01-03: qty 8
  Filled 2022-01-03 (×3): qty 3
  Filled 2022-01-03 (×2): qty 8
  Filled 2022-01-03: qty 3
  Filled 2022-01-03 (×2): qty 8

## 2022-01-03 MED ORDER — CLOPIDOGREL BISULFATE 75 MG PO TABS: 75.0000 mg | ORAL_TABLET | Freq: Every day | ORAL | Status: DC

## 2022-01-03 MED ORDER — CHLORHEXIDINE GLUCONATE 0.12 % MT SOLN
15.0000 mL | Freq: Two times a day (BID) | OROMUCOSAL | Status: DC
  Administered 2022-01-03 – 2022-01-09 (×12): 15 mL via OROMUCOSAL
  Filled 2022-01-03 (×12): qty 15

## 2022-01-03 MED ORDER — ASPIRIN 81 MG PO CHEW: 81.0000 mg | CHEWABLE_TABLET | Freq: Every day | ORAL | Status: DC

## 2022-01-03 MED ORDER — ASPIRIN 300 MG RE SUPP
300.0000 mg | Freq: Every day | RECTAL | Status: DC
  Administered 2022-01-03: 300 mg via RECTAL
  Filled 2022-01-03 (×5): qty 1

## 2022-01-03 MED ORDER — ASPIRIN 81 MG PO CHEW
81.0000 mg | CHEWABLE_TABLET | Freq: Every day | ORAL | Status: DC
Start: 2022-01-03 — End: 2022-01-09
  Administered 2022-01-04 – 2022-01-09 (×6): 81 mg via ORAL
  Filled 2022-01-03 (×6): qty 1

## 2022-01-03 MED ORDER — ORAL CARE MOUTH RINSE
15.0000 mL | Freq: Two times a day (BID) | OROMUCOSAL | Status: DC
  Administered 2022-01-03 – 2022-01-08 (×10): 15 mL via OROMUCOSAL

## 2022-01-03 MED ORDER — CYANOCOBALAMIN 1000 MCG/ML IJ SOLN
1000.0000 ug | Freq: Every day | INTRAMUSCULAR | Status: AC
  Administered 2022-01-03 – 2022-01-09 (×7): 1000 ug via INTRAMUSCULAR
  Filled 2022-01-03 (×7): qty 1

## 2022-01-03 MED ORDER — FOLIC ACID 5 MG/ML IJ SOLN
1.0000 mg | Freq: Every day | INTRAMUSCULAR | Status: DC
  Administered 2022-01-03 – 2022-01-09 (×7): 1 mg via INTRAVENOUS
  Filled 2022-01-03 (×7): qty 0.2

## 2022-01-03 NOTE — Progress Notes (Signed)
[facility] Progress Note ? ?Patient: Michael Padilla    [id number]  DOA: 01/02/2022    ?Date of Service: the patient was seen and examined on 01/03/2022 ? ?Brief hospital course: ?Patient is a 66 year old white male with past medical history of alcohol abuse, tobacco abuse and COPD hospitalized a month ago for COVID and diarrhea and was reportedly doing fine when checked on by his son the previous day, but on 3/13 morning, patient was found to be unresponsive and then woke up and was confused and EMS was called.  In the emergency room, patient was to be encephalopathic with a CT scan of head unremarkable, but MRI noting small subacute infarct.  Lab work noteworthy for some hyponatremia with sodium of 124 although patient has history of chronic hyponatremia.  He was noted to have a leukocytosis 17, platelet count of 6.5 and lactic acid level of 2.5 although urine was only weakly positive for infection and CT scan noted no evidence of PE or pneumonia.  Procalcitonin also mildly elevated 0.33 and patient was started on IV Unasyn.  Neurology consulted.  EEG unremarkable for acute seizure but noted to have signs consistent with moderate diffuse encephalopathy and nonspecific.  Echocardiogram done was unremarkable.  Patient placed in stepdown unit oriented to self only with garbled speech.  When he sleeps at times, oxygen saturations drop although he overall is not hypoxic when he is awake. ? ?Assessment and Plan: ?Assessment and Plan: ?* Acute metabolic encephalopathy ?Unclear etiology.  May be a cause of effects from stroke.  Does not appear to be seizure activity.  There may be some underlying Warnicke's encephalopathy due to alcohol.  Monitor closely. ? ?Acute CVA (cerebrovascular accident) ([place]) ?Small 6 mm acute to early subacute ischemic infarct in the left cerebellum.  Would not necessarily explain patient's encephalopathy episodes. ? ?Hyponatremia ?Mild.  In part due to patient's alcohol intake as well  as dehydration.  Up to 128 with no change in mentation. ? ?Alcohol abuse ?Monitor for withdrawals.  On CIWA protocol.  Unclear when his last drink was. ? ?COPD with acute exacerbation ([place]) ?Patient has history of tobacco abuse.  Not on oxygen at home.  His oxygen saturations here are good unless he falls asleep and then his oxygen saturations drop, especially due to mouth breathing.  Trying high flow oxygen to see if this can keep his saturations up.  ABG is not impressive and he is not acidotic with no CO2 retention. ? ?Urinary tract infection ?Patient does look to have urinary tract infection.  Cultures pending.  Given elevated procalcitonin level, white cells and nitrite positive.  We will treat with IV Unasyn which will cover any other infection that we are not seeing. ? ? ? ? ? ? ?Body mass index is 18.77 kg/m?.  ?  ?   ? ?Consultants: ?Neurology ? ?Procedures: ?EEG: Nonspecific delirium ?Echocardiogram: Preserved ejection fraction ? ?Antimicrobials: ?IV Unasyn 3/12-present ? ?Code Status: Full code ? ? ?Subjective: Patient with garbled speech, does not answer my questions ? ?Objective: ?Noted tachypnea and tachycardia ?Vitals:  ? 01/03/22 1500 01/03/22 1600  ?BP: 113/90 111/79  ?Pulse: (!) 106   ?Resp: (!) 30 (!) 33  ?Temp:  97.6 ?F (36.4 ?C)  ?SpO2: 100% 94%  ? ? ?Intake/Output Summary (Last 24 hours) at 01/03/2022 1641 ?Last data filed at 01/03/2022 1559 ?Gross per 24 hour  ?Intake 746.12 ml  ?Output 800 ml  ?Net -53.88 ml  ? ?Filed Weights  ? 01/02/22 1500 01/02/22  1748  ?Weight: 63.5 kg 56 kg  ? ?Body mass index is 18.77 kg/m?. ? ?Exam: ? ?General: Awake, does not really follow commands or answer questions, oriented x1 ?HEENT: Normocephalic, atraumatic, mucous membranes are dry ?Cardiovascular: Regular rhythm, borderline tachycardia ?Respiratory: Scattered rhonchi ?Abdomen: Soft, nontender, nondistended, positive bowel sounds ?Musculoskeletal: No clubbing or cyanosis, trace pitting edema ?Skin: No skin  breaks, tears or lesions ?Psychiatry: Acutely delirious ?Neurology: Does not appear to have any focal deficits although difficult given his delirium to assess ? ?Data Reviewed: ?Noted improvement in sodium to 128, ABG which is normal.  Procalcitonin of 0.33 ? ?Disposition:  ?Status is: Inpatient ?Remains inpatient appropriate because: Continued further work-up, stabilization ?  ? ?Family Communication: Left message for patient's son ?DVT Prophylaxis: ?Place and maintain sequential compression device Start: 01/02/22 1553 ? ? ? ?Author: ?Annita Brod ,MD ?01/03/2022 4:41 PM ? ?To reach On-call, see care teams to locate the attending and reach out via [email or web address]. ?Between 7PM-7AM, please contact night-coverage ?If you still have difficulty reaching the attending provider, please page the [facility] (Director on Call) for [facility] on amion for assistance. ? ?

## 2022-01-03 NOTE — Assessment & Plan Note (Signed)
Monitor for withdrawals.  On CIWA protocol.  Unclear when his last drink was.

## 2022-01-03 NOTE — Progress Notes (Signed)
RT called to adjust HFNC settings due to O2 saturations de-sating when pt asleep. Will continue to evaluate the response. ?Pt does wake up to voice now and is communicating more with this RN. Oriented x2. Per pt he does not remember what happened before he came here. Following simple commands this evening. Will continue to monitor.  ?

## 2022-01-03 NOTE — Progress Notes (Signed)
Neurology progress note  S:Patient did not follow commands on my exam this afternoon but would mimic. Per RN intermittently follows commands. NPO 2/2 mental status, weak cough/gag. MRI brain shows small acute/subacute L cerebellar sroke.  Interval data:  EEG: diffuse slowing, beta, no epileptiform abnl  MRI brain wo: 58mm focus restricted diffusion L cerebellum, likely acute to subacute infarct, mild CSVID  CTA H&N: no LVO, ~50% stenosis L vert  CNS imaging personally reviewed  Labs  Na+ 124 -> 128 Plts 561 UDS pan-neg  B12 108 RPR neg TSH WNL  O:  Vitals:   01/03/22 1200 01/03/22 1300  BP: 130/87 (!) 136/91  Pulse: (!) 113 (!) 111  Resp: (!) 33 (!) 21  Temp: (!) 97.3 F (36.3 C)   SpO2: 97% 97%    Physical Exam Gen: arousable to vigorous sternal rub, does not follow commands but will mimic HEENT: Atraumatic, normocephalic; oropharynx clear, tongue without atrophy or fasciculations. Resp: CTAB, normal work of breathing CV: RRR, extremities appear well-perfused. Abd: soft/NT/ND Extrem: Nml bulk; no cyanosis, clubbing, or edema.  Neuro: *MS: arousable to vigorous sternal rub, does not follow commands but will mimic. Does not respond to orientation questions. *Speech: mumbles moderate dysarthria, no attempts at intelligible speech *CN: PERRL, blinks to threat bilat, EOMI, face symmetric at rest, hearing intact to voice *Motor: anti-gravity BUE equally, withdraws to noxious stimuli BLE equally. Unable to participate in formal strength exam 2/2 mental status. *Sensory: SILT *Reflexes: 1+ symmetric throughout, toes mute bilat *Coordination, gait:  UTA *Reflexes:  2+ and symmetric throughout without clonus; toes down-going bilat *Gait: deferred  NIHSS 1a Level of Conscious.: 2 1b LOC Questions: 2 1c LOC Commands: 2 2 Best Gaze: 0 3 Visual: 0 4 Facial Palsy: 0 5a Motor Arm - left: 2 5b Motor Arm - Right: 2 6a Motor Leg - Left: 3 6b Motor Leg - Right: 3 7 Limb  Ataxia: 0 8 Sensory: 0 9 Best Language: 3 10 Dysarthria: 1 11 Extinct. and Inatten.: 0  TOTAL: 20  Premorbid mRS = 77  A/P: 66 year old male alcoholic presenting with AMS after initially being found unresponsive by family 1. Exam reveals findings most consistent with a delirium. Exam w/ less movement of RUE than L and speech deficit. 2. MRI brain shows small acute to subacute L cerebellar ischemic infarct, which is favored to be incidental since this would not cause unilateral weakness, aphasia, seizure, or encephalopathy. 3. Chronic hyponatremia slightly worse on admission at 124, now back to recent baseline 128. DDx includes beer potomania.  2. Ammonia level normal at 30.  3. Estimated GFR of > 60.  4. Lactate normal at 1.7 5. Elevated WBC at 17.7. Evaluate for possible infection.  6. Thrombocytosis on CBC. 7. DDx for presentation includes severe Wernicke encephalopathy, delirium tremens, unwitnessed EtOH withdrawal seizure with postictal state. He has no documented history of seizures, but his hyponatremia may have lowered the seizure threshold. EEG showed no epileptiform abnl, but seizure prior to presentation cannot be ruled out.  Recommendations: 1. High dose thiamine supp. F/u thiamine level 2. CIWA protocol with PRN Ativan. Also will need scheduled Ativan 0.5 mg IV q4h for now.  3. Management of hyponatremia per primary team.  4. NPO until advanced by SLP 5. ASA 81mg  daily + plavix 75mg  daily x21 days f/b ASA 81mg  daily monotherapy after that when able to take PO. For now, ASA 300mg  PR daily.  6. Infarct appears subacute, no indication for permissive HTN, goal  normotension but avoid hypotension. 7. TTE 8. LDL, A1c 9. STAT head CT for any decline in neurologic exam 10. PT/OT/SLP when able to participate.  11. If his mental status does not improve or continues to fluctuate, will need repeat EEG 12. Aggressive B12 supp for B12 108; 1048mcg daily x1 wk f/b weekly x4 wks  Will  continue to follow.  Su Monks, MD [facility] [phone]  If 7pm- 7am, please page neurology on call as listed in [place].

## 2022-01-03 NOTE — Assessment & Plan Note (Signed)
Small 6 mm acute to early subacute ischemic infarct in the left cerebellum.  Would not necessarily explain patient's encephalopathy episodes.

## 2022-01-03 NOTE — Assessment & Plan Note (Addendum)
Patient does look to have urinary tract infection.  Cultures pending.  Given elevated procalcitonin level, white cells and nitrite positive.  We will treat with IV Unasyn which will cover any other infection that we are not seeing.

## 2022-01-03 NOTE — Assessment & Plan Note (Signed)
Mild.  In part due to patient's alcohol intake as well as dehydration.  Up to 128 with no change in mentation.

## 2022-01-03 NOTE — Hospital Course (Signed)
Patient is a 66 year old white male with past medical history of alcohol abuse, tobacco abuse and COPD hospitalized a month ago for COVID and diarrhea and was reportedly doing fine when checked on by his son the previous day, but on 3/13 morning, patient was found to be unresponsive and then woke up and was confused and EMS was called.  In the emergency room, patient was to be encephalopathic with a CT scan of head unremarkable, but MRI noting small subacute infarct.  Lab work noteworthy for some hyponatremia with sodium of 124 although patient has history of chronic hyponatremia.  He was noted to have a leukocytosis 17, platelet count of 6.5 and lactic acid level of 2.5 although urine was only weakly positive for infection and CT scan noted no evidence of PE or pneumonia.  Procalcitonin also mildly elevated 0.33 and patient was started on IV Unasyn.  Neurology consulted.  EEG unremarkable for acute seizure but noted to have signs consistent with moderate diffuse encephalopathy and nonspecific.  Echocardiogram done was unremarkable.  Patient placed in stepdown unit oriented to self only with garbled speech.  When he sleeps at times, oxygen saturations drop although he overall is not hypoxic when he is awake.

## 2022-01-03 NOTE — Progress Notes (Signed)
*  PRELIMINARY RESULTS* ?Echocardiogram ?2D Echocardiogram has been performed. ? ?Michael Padilla ?01/03/2022, 11:08 AM ?

## 2022-01-03 NOTE — Progress Notes (Signed)
SLP Cancellation Note ? ?Patient Details ?Name: Michael Padilla ?MRN: [id number] ?DOB: 10/14/1956 ? ? ?Cancelled treatment:       Reason Eval/Treat Not Completed: Patient not medically ready  ? ?Spoke with RN. Per RN, pt not appropriate for clincial swallowing evaluation at this time given AMS and concern for decreased secretion management (e.g. frequent non-productive, congested cough). Additionally, observed pt with increased RR (40s) following coughing episodes and increased HR at rest (120s).  ? ?Will defer SLP evaluation at this time given the above. RN aware and in agreement.  ? ?SLP to continue efforts as appropriate.  ? ?Cherrie Gauze, M.S., CCC-SLP ?Speech-Language Pathologist ?[facility] ?[phone] ([place])  ? ?Quintella Baton ?01/03/2022, 1:41 PM ?

## 2022-01-03 NOTE — Progress Notes (Signed)
SLP Cancellation Note ? ?Patient Details ?Name: Michael Padilla ?MRN: [id number] ?DOB: 09-Mar-1956 ? ? ?Cancelled treatment:       Reason Eval/Treat Not Completed: Patient at procedure or test/unavailable  ? ?SLP consult received and appreciated. Chart review completed. SLP consult unable to be completed at this time as pt undergoing echocardiogram (bubble study).  ? ?Will continue efforts as appropriate. ? ? ?Cherrie Gauze, M.S., CCC-SLP ?Speech-Language Pathologist ?[facility] ?[phone] ([place])  ? ?Quintella Baton ?01/03/2022, 9:51 AM ?

## 2022-01-03 NOTE — Assessment & Plan Note (Addendum)
Patient has history of tobacco abuse.  Not on oxygen at home.  His oxygen saturations here are good unless he falls asleep and then his oxygen saturations drop, especially due to mouth breathing.  Trying high flow oxygen to see if this can keep his saturations up.  ABG is not impressive and he is not acidotic with no CO2 retention. Currently patient is on 3 L oxygen via nasal cannula, we will continue to wean down as per improvement Started Breo Ellipta inhaler, Incruse Ellipta inhaler and albuterol as needed

## 2022-01-03 NOTE — Plan of Care (Signed)
Patient is from home, lives with his son. Patient came to ED via EMS for AMS and SOB. Patient has a history of ETOH with scheduled ativan ordered. Patient has voided incontinent since arrival to ICU09 and still needs to have urine collected and sent to lab.  ?

## 2022-01-03 NOTE — Progress Notes (Addendum)
Pt Alert on assessment. Restless, PRN ativan for CIWA scores. Oriented to self only. Might be following commands at times but is inconsistent. SLP order in place but is not appropriate for any PO at this time. RR 20s but can get up to 40's with coughing fits. Congested, weak, non-productive cough. HR up to 120's. MD made aware. New breathing treatment ordered. Actively and symmetrically moving all extremities.  ?

## 2022-01-03 NOTE — Progress Notes (Signed)
Pt O2 desating to 70's when asleep. Pt aroused and instructed to deep breathe through nose with 6 L [place] and O2 sats resolved to 100%. MD made aware and RT placed HFNC for pt sleep. ABG ordered. ?

## 2022-01-03 NOTE — Assessment & Plan Note (Signed)
Unclear etiology.  May be a cause of effects from stroke.  Does not appear to be seizure activity.  There may be some underlying Warnicke's encephalopathy due to alcohol.  Monitor closely.

## 2022-01-04 DIAGNOSIS — G9341 Metabolic encephalopathy: Secondary | ICD-10-CM | POA: Diagnosis not present

## 2022-01-04 DIAGNOSIS — I639 Cerebral infarction, unspecified: Secondary | ICD-10-CM | POA: Diagnosis not present

## 2022-01-04 DIAGNOSIS — F1010 Alcohol abuse, uncomplicated: Secondary | ICD-10-CM | POA: Diagnosis not present

## 2022-01-04 DIAGNOSIS — E871 Hypo-osmolality and hyponatremia: Secondary | ICD-10-CM | POA: Diagnosis not present

## 2022-01-04 LAB — CBC
HCT: 44.7 % (ref 39.0–52.0)
Hemoglobin: 14.8 g/dL (ref 13.0–17.0)
MCH: 32.7 pg (ref 26.0–34.0)
MCHC: 33.1 g/dL (ref 30.0–36.0)
MCV: 98.9 fL (ref 80.0–100.0)
Platelets: 597 10*3/uL — ABNORMAL HIGH (ref 150–400)
RBC: 4.52 MIL/uL (ref 4.22–5.81)
RDW: 14.1 % (ref 11.5–15.5)
WBC: 14.7 10*3/uL — ABNORMAL HIGH (ref 4.0–10.5)
nRBC: 0 % (ref 0.0–0.2)

## 2022-01-04 LAB — BASIC METABOLIC PANEL
Anion gap: 9 (ref 5–15)
BUN: 13 mg/dL (ref 8–23)
CO2: 26 mmol/L (ref 22–32)
Calcium: 8.7 mg/dL — ABNORMAL LOW (ref 8.9–10.3)
Chloride: 98 mmol/L (ref 98–111)
Creatinine, Ser: 0.39 mg/dL — ABNORMAL LOW (ref 0.61–1.24)
GFR, Estimated: >60 mL/min (ref >60)
Glucose, Bld: 88 mg/dL (ref 70–99)
Potassium: 4 mmol/L (ref 3.5–5.1)
Sodium: 133 mmol/L — ABNORMAL LOW (ref 135–145)

## 2022-01-04 LAB — GLUCOSE, CAPILLARY
Glucose-Capillary: 76 mg/dL (ref 70–99)
Glucose-Capillary: 97 mg/dL (ref 70–99)
Glucose-Capillary: 81 mg/dL (ref 70–99)

## 2022-01-04 LAB — HEMOGLOBIN A1C
Hgb A1c MFr Bld: 5.2 % (ref 4.8–5.6)
Mean Plasma Glucose: 103 mg/dL

## 2022-01-04 MED ORDER — NICOTINE 21 MG/24HR TD PT24
21.0000 mg | MEDICATED_PATCH | Freq: Every day | TRANSDERMAL | Status: DC
  Administered 2022-01-04 – 2022-01-09 (×6): 21 mg via TRANSDERMAL
  Filled 2022-01-04 (×6): qty 1

## 2022-01-04 NOTE — Evaluation (Signed)
Clinical/Bedside Swallow Evaluation ?Patient Details  ?Name: Michael Padilla ?MRN: [id number] ?Date of Birth: [birth year] ? ?Today's Date: 01/04/2022 ?Time: SLP Start Time (ACUTE ONLY): 1010 SLP Stop Time (ACUTE ONLY): 1110 ?SLP Time Calculation (min) (ACUTE ONLY): 60 min ? ?Past Medical History:  ?Past Medical History:  ?Diagnosis Date  ? Arthritis   ? COPD (chronic obstructive pulmonary disease) ([facility])   ? ?Past Surgical History:  ?Past Surgical History:  ?Procedure Laterality Date  ? none    ? ?HPI:  ?Pt is a 66 y.o. male with history of alcohol abuse last drink yesterday, tobacco abuse, Emphysema, COPD, who recently admitted for diarrhea and COVID+ about a month ago was doing ok until yesterday when patient's son checked on him in the morning at around 10 AM when patient was initially unresponsive and later became more confused and at that time EMS was called.  Patient also was found to be short of breath taking deep breaths.   ED Course: In the ER patient is encephalopathic but waking up on calling his name.  Does not follow commands.  CT of Head revealed Chronic microvascular ischemic changes and atrophy in the cerebral white matter.  MRI revealed: 6 mm focus of mild diffusion signal abnormality involving the  left cerebellum, likely a small acute to early subacute ischemic  nonhemorrhagic infarct.  Labs show sodium of 128. Has chronic hyponatremia.  Pt was given antibiotics steroids and nebulizer.  Patient admitted for acute encephalopathy unresponsive episode shortness of breath.     CT of Chest: Lungs/Pleura: Moderate centrilobular Emphysema. No confluent  opacities or effusions.  ?  ?Assessment / Plan / Recommendation  ?Clinical Impression ?  Pt seen today for BSE; Son present during session. Pt awake, verbal w/ 1-2 word responses. He was easily distracted at times but redirectible w/ cues. Increased RR(20s-40s) and effort at REST, and w/ any exertion. Son present. Pt continues to exhibit encephalopathy; has a  small 6 mm acute to early subacute ischemic infarct in the left cerebellum. Per MD, would not necessarily explain patient's encephalopathy episodes. MD suspects possibly some underlying Wernicke's encephalopathy due to alcohol use/abuse and may be having some withdrawals, given the tachycardia. On CIWA protocol.  ? ?Pt appears to present w/ oropharyngeal phase dysphagia in setting of declined Pulmonary status, need for Mod cues d/t declined Cognitive status, and acuity of illness; pt is also Edentulous at baseline. These issues can impact overall awareness/timing of swallow and safety during po tasks which increases risk for aspiration, choking. Due to pt's current Pulmonary status, pt's risk for aspiration w/ an oral diet is deemed too high at this time. He demonstrated ability for adequate/safe oropharyngeal swallowing of therapeutic trials of applesauce and single ice chips w/ Supervision and aspiration precautions.  ?  ?After full positioning in bed for upright sitting, pt consumed trials of po's including ice chips, purees, and thin and Nectar liquids w/ inconsistent, overt clinical s/s of aspiration noted w/ the Nectar and thin liquid consistencies(cough, throat clearing, audible swallows). No decline in respiratory status overall from his baseline during/post trials of purees and single ice chips. Full REST BREAKS given b/t trials d/t pt's increased RR w/ exertion of the po tasks and swallow itself -- pt encouraged to pause and take his time w/ po's in order to calm his RR. Oral phase deficits c/b reduced oral awareness and oral prep awareness, increased bolus management time and prolonged A-P transfer/holding time(~13-18 seconds). Oral clearing of all bolus trials given was achieved  post swallow. Suspect pt may have had extended oral phase time in order to coordinated swallowing w/ apnea moment.  ? ?Pt attempted self-feeding but required Min-Mod support and guidance d/t Cognitive decline. Was able to help  hold own Cup during drinking which improves safety of swallowing. OM Exam revealed generalized weakness; no unilateral lingula weakness noted. Some confusion of OM tasks and oral care inhibiting good follow through w/ tasks.   ?  ?Recommend continue NPO status for meals w/ frequent oral care for hygiene and stimulation of swallowing, and per MD ok, Pleasure (single) ice chips w/ Supervision - therapeutic swallowing. ST services will f/u w/ pt tomorrow for ongoing assessment of swallowing in hopes to upgrade diet to least restrictive po diet consistency. The POC was discussed w/ Son and NSG who agreed. MD updated.  ?SLP Visit Diagnosis: Dysphagia, oropharyngeal phase (R13.12) ?   ?Aspiration Risk ? Moderate aspiration risk;Risk for inadequate nutrition/hydration  ?  ?Diet Recommendation   NPO status for meals w/ frequent oral care for hygiene and stimulation of swallowing ? ?Medication Administration: Crushed with puree  ?  ?Other  Recommendations Recommended Consults:  (Dietician f/u) ?Oral Care Recommendations: Oral care BID;Oral care before and after PO;Oral care prior to ice chip/H20;Staff/trained caregiver to provide oral care ?Other Recommendations:  (TBD)   ? ?Recommendations for follow up therapy are one component of a multi-disciplinary discharge planning process, led by the attending physician.  Recommendations may be updated based on patient status, additional functional criteria and insurance authorization. ? ?Follow up Recommendations Skilled nursing-short term rehab (<3 hours/day)  ? ? ?  ?Assistance Recommended at Discharge Frequent or constant Supervision/Assistance  ?Functional Status Assessment Patient has had a recent decline in their functional status and/or demonstrates limited ability to make significant improvements in function in a reasonable and predictable amount of time  ?Frequency and Duration min 3x week  ?2 weeks ?  ?   ? ?Prognosis Prognosis for Safe Diet Advancement: Fair ?Barriers to  Reach Goals: Cognitive deficits;Time post onset;Severity of deficits ?Barriers/Prognosis Comment: deconditioned at baseline; unsure of Cognitive status; ETOH abuse/use at home  ? ?  ? ?Swallow Study   ?General Date of Onset: 01/02/22 ?HPI: Pt is a 66 y.o. male with history of alcohol abuse last drink yesterday, tobacco abuse, Emphysema, COPD, who recently admitted for diarrhea and COVID+ about a month ago was doing ok until yesterday when patient's son checked on him in the morning at around 10 AM when patient was initially unresponsive and later became more confused and at that time EMS was called.  Patient also was found to be short of breath taking deep breaths.   ED Course: In the ER patient is encephalopathic but waking up on calling his name.  Does not follow commands.  CT of Head revealed Chronic microvascular ischemic changes and atrophy in the cerebral white matter.  MRI revealed: 6 mm focus of mild diffusion signal abnormality involving the  left cerebellum, likely a small acute to early subacute ischemic  nonhemorrhagic infarct.  Labs show sodium of 128. Has chronic hyponatremia.  Pt was given antibiotics steroids and nebulizer.  Patient admitted for acute encephalopathy unresponsive episode shortness of breath.     CT of Chest: Lungs/Pleura: Moderate centrilobular Emphysema. No confluent  opacities or effusions. ?Type of Study: Bedside Swallow Evaluation ?Previous Swallow Assessment: none ?Diet Prior to this Study: NPO ?Temperature Spikes Noted: No (wbc 14.7) ?Respiratory Status: Nasal cannula (4-10L HFNC) ?History of Recent Intubation: No ?Behavior/Cognition: Alert;Cooperative;Pleasant mood;Distractible;Requires  cueing ?Oral Cavity Assessment: Dried secretions;Dry ?Oral Care Completed by SLP: Yes ?Oral Cavity - Dentition: Edentulous (does not wear dentures) ?Vision: Functional for self-feeding ?Self-Feeding Abilities: Able to feed self;Needs assist;Needs set up;Total assist (weakness in UEs) ?Patient  Positioning: Upright in bed (needed full positioning) ?Baseline Vocal Quality: Low vocal intensity (adequate) ?Volitional Cough: Weak;Congested (-Fair) ?Volitional Swallow: Unable to elicit  ?  ?Oral/Mot

## 2022-01-04 NOTE — Progress Notes (Signed)
Initial Nutrition Assessment ? ?DOCUMENTATION CODES:  ? ?Severe malnutrition in context of chronic illness ? ?INTERVENTION:  ? ?-RD will follow for diet advancement and add supplements as appropriate ? ?NUTRITION DIAGNOSIS:  ? ?Severe Malnutrition related to chronic illness (COPD, ETOH and tobacco abuse) as evidenced by moderate fat depletion, severe fat depletion, moderate muscle depletion, severe muscle depletion. ? ?GOAL:  ? ?Patient will meet greater than or equal to 90% of their needs ? ?MONITOR:  ? ?PO intake, Supplement acceptance, Diet advancement, Labs, Weight trends, Skin, I & O's ? ?REASON FOR ASSESSMENT:  ? ?Consult ?Assessment of nutrition requirement/status ? ?ASSESSMENT:  ? ?Michael Padilla is a 66 y.o. male with history of alcohol abuse last drink yesterday, tobacco abuse COPD recently admitted for diarrhea and COVID about a month ago was doing fine until yesterday when patient's son checked on him this morning at around 10 AM when patient was initially unresponsive and later became more confused and at that time EMS was called.  Patient also was found to be short of breath taking deep breaths. ? ?Pt admitted with acute encephalopathy and hypernatremia.  ? ?3/14- s/p BSE- recommend continue NPO ? ?Reviewed I/O's: -268 ml x 24 hours and +193 since admission ? ?UOP: 850 ml x 24 hours ?  ?Per SLP notes, plan to follow-up tomorrow for swallow assessment; hopeful to advance to PO diet.  ? ?Spoke with pt at bedside, who was sitting up and smiling at time of visit. Pt reports he feels a lot better since being admitted to the hospital. Pt shares that he has a good appetite and consumes 2 meals per day PTA (Breakfast: eggs and toast; Dinner: meat, starch, and vegetable provided by son). Pt reports he consumes 2 "big cans" of beer (48 ounces) daily. He denies drinking any other liquids or alcohol other than beer.  ? ?Pt denies any weight loss. He reports he has always been thin. He is unsure of UBW. Reviewed  wt hx; pt has experienced a 11.8% wt loss over the past 14 months, which is not significant for time frame, however, concerning given ETOH and tobacco abuse.  ? ?Medications reviewed and include vitamin B-12, folic acid, ativan, and thiamine.  ? ?Labs reviewed: Na: 133.   ? ?NUTRITION - FOCUSED PHYSICAL EXAM: ? ?Flowsheet Row Most Recent Value  ?Orbital Region Moderate depletion  ?Upper Arm Region Severe depletion  ?Thoracic and Lumbar Region Severe depletion  ?Buccal Region Moderate depletion  ?Temple Region Moderate depletion  ?Clavicle Bone Region Severe depletion  ?Clavicle and Acromion Bone Region Severe depletion  ?Scapular Bone Region Severe depletion  ?Dorsal Hand Severe depletion  ?Patellar Region Moderate depletion  ?Anterior Thigh Region Moderate depletion  ?Posterior Calf Region Moderate depletion  ?Edema (RD Assessment) None  ?Hair Reviewed  ?Eyes Reviewed  ?Mouth Reviewed  ?Skin Reviewed  ?Nails Reviewed  ? ?  ? ? ?Diet Order:   ?Diet Order   ? ? None  ? ?  ? ? ?EDUCATION NEEDS:  ? ?Education needs have been addressed ? ?Skin:  Skin Assessment: Reviewed RN Assessment ? ?Last BM:  Unknown ? ?Height:  ? ?Ht Readings from Last 1 Encounters:  ?01/02/22 5\' 8"  (1.727 m)  ? ? ?Weight:  ? ?Wt Readings from Last 1 Encounters:  ?01/02/22 56 kg  ? ? ?Ideal Body Weight:  70 kg ? ?BMI:  Body mass index is 18.77 kg/m?. ? ?Estimated Nutritional Needs:  ? ?Kcal:  1950-2150 ? ?Protein:  100-115 grams ? ?  Fluid:  > 1.9 L ? ? ? ?Levada Schilling, RD, LDN, CDCES ?Registered Dietitian II ?Certified Diabetes Care and Education Specialist ?Please refer to [facility] for RD and/or RD on-call/weekend/after hours pager  ?

## 2022-01-04 NOTE — Progress Notes (Signed)
[facility] Progress Note ? ?Patient: Michael Padilla    ZES:[id number]  DOA: 01/02/2022    ?Date of Service: the patient was seen and examined on 01/04/2022 ? ?Brief hospital course: ?Patient is a 66 year old white male with past medical history of alcohol abuse, tobacco abuse and COPD hospitalized a month ago for COVID and diarrhea and was reportedly doing fine when checked on by his son the previous day, but on 3/13 morning, patient was found to be unresponsive and then woke up and was confused and EMS was called.  In the emergency room, patient was to be encephalopathic with a CT scan of head unremarkable, but MRI noting small subacute infarct.  Lab work noteworthy for some hyponatremia with sodium of 124 although patient has history of chronic hyponatremia.  He was noted to have a leukocytosis 17, platelet count of 6.5 and lactic acid level of 2.5 although urine was only weakly positive for infection and CT scan noted no evidence of PE or pneumonia.  Procalcitonin also mildly elevated 0.33 and patient was started on IV Unasyn.  Neurology consulted.  EEG unremarkable for acute seizure but noted to have signs consistent with moderate diffuse encephalopathy and nonspecific.  Echocardiogram done was unremarkable.  Patient placed in stepdown unit oriented to self only with garbled speech.  When he sleeps at times, oxygen saturations drop although he overall is not hypoxic when he is awake. ? ?By 3/14, patient improved.  He has woken up on his own and is more interactive although still confused and confabulates.  Lab work notes improvement in his sodium. ? ?Assessment and Plan: ?Assessment and Plan: ?* Acute metabolic encephalopathy ?Unclear etiology.  May be a cause of effects from stroke.  Does not appear to be seizure activity.  There may be some underlying Wernicke's encephalopathy due to alcohol.  Monitor closely.  Patient more awake and interactive.  He does answer questions appropriately, but incorrectly  and I suspect that he likely has some confabulation likely from long-term alcohol use. ? ?Acute CVA (cerebrovascular accident) (HCC) ?Small 6 mm acute to early subacute ischemic infarct in the left cerebellum.  Would not necessarily explain patient's encephalopathy episodes. ? ?Hyponatremia ? In part due to patient's alcohol intake as well as dehydration.  Much improved from, gone from 124 to 133 currently.  Patient more clinically awake ? ?Alcohol abuse ?He may be having some withdrawals, given tachycardia.  On CIWA protocol.  Unclear when his last drink was. ? ?COPD with acute exacerbation (HCC) ?Patient has history of tobacco abuse.  Not on oxygen at home.  His oxygen saturations here are good unless he falls asleep and then his oxygen saturations drop, especially due to mouth breathing.  Trying high flow oxygen to see if this can keep his saturations up.  ABG is not impressive and he is not acidotic with no CO2 retention. ? ?Urinary tract infection ?Patient does look to have urinary tract infection.  Cultures pending.  Given elevated procalcitonin level, white cells and nitrite positive.  We will treat with IV Unasyn which will cover any other infection that we are not seeing.  Noted increase in white blood cell count.  Recheck labs plus procalcitonin in the morning. ? ? ? ? ? ? ?Body mass index is 18.77 kg/m?.  ?  ?   ? ?Consultants: ?Neurology ? ?Procedures: ?EEG: Nonspecific delirium ?Echocardiogram: Preserved ejection fraction ? ?Antimicrobials: ?IV Unasyn 3/12-present ? ?Code Status: Full code ? ? ?Subjective: Patient more awake and answers questions  appropriately, but incorrectly.  Some confabulation ? ?Objective: ?Noted tachypnea and tachycardia ?Vitals:  ? 01/04/22 1344 01/04/22 1355  ?BP:    ?Pulse: (!) 116 (!) 115  ?Resp: (!) 29 (!) 35  ?Temp:    ?SpO2: 92% 95%  ? ? ?Intake/Output Summary (Last 24 hours) at 01/04/2022 1420 ?Last data filed at 01/04/2022 1202 ?Gross per 24 hour  ?Intake 676.97 ml  ?Output  750 ml  ?Net -73.03 ml  ? ? ?Filed Weights  ? 01/02/22 1500 01/02/22 1748  ?Weight: 63.5 kg 56 kg  ? ?Body mass index is 18.77 kg/m?. ? ?Exam: ? ?General: Awake, follows commands, oriented x1 ?HEENT: Normocephalic, atraumatic, mucous membranes are dry ?Cardiovascular: Regular rhythm, tachycardia ?Respiratory: Scattered rhonchi ?Abdomen: Soft, nontender, nondistended, positive bowel sounds ?Musculoskeletal: No clubbing or cyanosis, trace pitting edema ?Skin: No skin breaks, tears or lesions ?Psychiatry: Less delirious, some of this may be underlying dementia ?Neurology: Does not appear to have any focal deficits although difficult given his delirium to assess ? ?Data Reviewed: ?Noted improvement in sodium to 133, increased white count to 14 ?Disposition:  ?Status is: Inpatient ?Remains inpatient appropriate because: Continued further work-up, stabilization ?  ? ?Family Communication: Patient's brother at the bedside ?DVT Prophylaxis: ?Place and maintain sequential compression device Start: 01/02/22 1553 ? ? ? ?Author: ?Hollice Espy ,MD ?01/04/2022 2:20 PM ? ?To reach On-call, see care teams to locate the attending and reach out via [email or web address]. ?Between 7PM-7AM, please contact night-coverage ?If you still have difficulty reaching the attending provider, please page the [employer] [facility] [employer] (Director on Call) for [facility] on amion for assistance. ? ?

## 2022-01-05 DIAGNOSIS — J9601 Acute respiratory failure with hypoxia: Secondary | ICD-10-CM | POA: Diagnosis present

## 2022-01-05 DIAGNOSIS — J9621 Acute and chronic respiratory failure with hypoxia: Secondary | ICD-10-CM | POA: Diagnosis present

## 2022-01-05 DIAGNOSIS — E43 Unspecified severe protein-calorie malnutrition: Secondary | ICD-10-CM | POA: Insufficient documentation

## 2022-01-05 DIAGNOSIS — G9341 Metabolic encephalopathy: Secondary | ICD-10-CM | POA: Diagnosis not present

## 2022-01-05 LAB — COMPREHENSIVE METABOLIC PANEL
ALT: 16 U/L (ref 0–44)
AST: 27 U/L (ref 15–41)
Albumin: 3.5 g/dL (ref 3.5–5.0)
Alkaline Phosphatase: 76 U/L (ref 38–126)
Anion gap: 11 (ref 5–15)
BUN: 10 mg/dL (ref 8–23)
CO2: 26 mmol/L (ref 22–32)
Calcium: 8.6 mg/dL — ABNORMAL LOW (ref 8.9–10.3)
Chloride: 101 mmol/L (ref 98–111)
Creatinine, Ser: 0.48 mg/dL — ABNORMAL LOW (ref 0.61–1.24)
GFR, Estimated: >60 mL/min (ref >60)
Glucose, Bld: 84 mg/dL (ref 70–99)
Potassium: 3.7 mmol/L (ref 3.5–5.1)
Sodium: 138 mmol/L (ref 135–145)
Total Bilirubin: 1.1 mg/dL (ref 0.3–1.2)
Total Protein: 6.8 g/dL (ref 6.5–8.1)

## 2022-01-05 LAB — CBC
HCT: 45.3 % (ref 39.0–52.0)
Hemoglobin: 14.9 g/dL (ref 13.0–17.0)
MCH: 32.6 pg (ref 26.0–34.0)
MCHC: 32.9 g/dL (ref 30.0–36.0)
MCV: 99.1 fL (ref 80.0–100.0)
Platelets: 662 10*3/uL — ABNORMAL HIGH (ref 150–400)
RBC: 4.57 MIL/uL (ref 4.22–5.81)
RDW: 14.1 % (ref 11.5–15.5)
WBC: 8.5 10*3/uL (ref 4.0–10.5)
nRBC: 0 % (ref 0.0–0.2)

## 2022-01-05 LAB — BLOOD GAS, VENOUS
Acid-Base Excess: 1.5 mmol/L (ref 0.0–2.0)
Bicarbonate: 27.2 mmol/L (ref 20.0–28.0)
Patient temperature: 37
pCO2, Ven: 46 mmHg (ref 44–60)
pH, Ven: 7.38 (ref 7.25–7.43)

## 2022-01-05 LAB — GLUCOSE, CAPILLARY
Glucose-Capillary: 72 mg/dL (ref 70–99)
Glucose-Capillary: 78 mg/dL (ref 70–99)
Glucose-Capillary: 125 mg/dL — ABNORMAL HIGH (ref 70–99)
Glucose-Capillary: 144 mg/dL — ABNORMAL HIGH (ref 70–99)

## 2022-01-05 LAB — VITAMIN D 25 HYDROXY (VIT D DEFICIENCY, FRACTURES): Vit D, 25-Hydroxy: 7.31 ng/mL — ABNORMAL LOW (ref 30–100)

## 2022-01-05 LAB — PROCALCITONIN: Procalcitonin: <0.1 ng/mL

## 2022-01-05 MED ORDER — CLOPIDOGREL BISULFATE 75 MG PO TABS
75.0000 mg | ORAL_TABLET | Freq: Every day | ORAL | Status: DC
  Administered 2022-01-05 – 2022-01-09 (×5): 75 mg via ORAL
  Filled 2022-01-05 (×5): qty 1

## 2022-01-05 MED ORDER — THIAMINE HCL 100 MG PO TABS: 100.0000 mg | ORAL_TABLET | Freq: Every day | ORAL | Status: DC

## 2022-01-05 MED ORDER — METOPROLOL TARTRATE 50 MG PO TABS
50.0000 mg | ORAL_TABLET | Freq: Two times a day (BID) | ORAL | Status: DC
Start: 2022-01-05 — End: 2022-01-09
  Administered 2022-01-05 – 2022-01-09 (×9): 50 mg via ORAL
  Filled 2022-01-05 (×9): qty 1

## 2022-01-05 MED ORDER — DIAZEPAM 5 MG PO TABS
5.0000 mg | ORAL_TABLET | Freq: Two times a day (BID) | ORAL | Status: AC
  Administered 2022-01-06 (×2): 5 mg via ORAL
  Filled 2022-01-05 (×2): qty 1

## 2022-01-05 MED ORDER — DIAZEPAM 5 MG PO TABS
5.0000 mg | ORAL_TABLET | Freq: Every day | ORAL | Status: AC
Start: 2022-01-07 — End: 2022-01-08
  Administered 2022-01-07 – 2022-01-08 (×2): 5 mg via ORAL
  Filled 2022-01-05 (×2): qty 1

## 2022-01-05 MED ORDER — THIAMINE HCL 100 MG/ML IJ SOLN
250.0000 mg | INTRAVENOUS | Status: DC
  Administered 2022-01-06 – 2022-01-09 (×4): 250 mg via INTRAVENOUS
  Filled 2022-01-05 (×4): qty 2.5

## 2022-01-05 MED ORDER — ACETAMINOPHEN 325 MG PO TABS: 650.0000 mg | ORAL_TABLET | Freq: Four times a day (QID) | ORAL | Status: DC | PRN

## 2022-01-05 MED ORDER — DIAZEPAM 5 MG PO TABS
5.0000 mg | ORAL_TABLET | Freq: Three times a day (TID) | ORAL | Status: AC
  Administered 2022-01-05 – 2022-01-06 (×3): 5 mg via ORAL
  Filled 2022-01-05 (×3): qty 1

## 2022-01-05 MED ORDER — OXYCODONE HCL 5 MG PO TABS
5.0000 mg | ORAL_TABLET | Freq: Four times a day (QID) | ORAL | Status: DC | PRN
  Administered 2022-01-05 – 2022-01-06 (×2): 5 mg via ORAL
  Filled 2022-01-05 (×2): qty 1

## 2022-01-05 MED ORDER — ENOXAPARIN SODIUM 40 MG/0.4ML IJ SOSY
40.0000 mg | PREFILLED_SYRINGE | INTRAMUSCULAR | Status: DC
  Administered 2022-01-05 – 2022-01-08 (×4): 40 mg via SUBCUTANEOUS
  Filled 2022-01-05 (×4): qty 0.4

## 2022-01-05 NOTE — Care Management Important Message (Signed)
Important Message ? ?Patient Details  ?Name: Michael Padilla ?MRN: [id number] ?Date of Birth: 01-23-1956 ? ? ?Medicare Important Message Given:  N/A - LOS <3 / Initial given by admissions ? ? ? ? ?[name] ?01/05/2022, 8:44 AM ?

## 2022-01-05 NOTE — Progress Notes (Signed)
Speech Language Pathology Treatment: Dysphagia  ?Patient Details ?Name: Michael Padilla ?MRN: [id number] ?DOB: 1956-08-09 ?Today's Date: 01/05/2022 ?Time: 5188-4166 ?SLP Time Calculation (min) (ACUTE ONLY): 45 min ? ?Assessment / Plan / Recommendation ?Clinical Impression ? Pt seen for ongoing assessment of safety w/ swallowing of oral intake in hopes to establish a least restrictive oral diet today. Pt awake, verbal and engagement was improved from yesterday at initial evaluation. Noted min distracted at times but redirectible w/ cues. RR(20s) and effort at REST much calmer w/ min increased effort during exertion but calmed again w/ Rest Break. Per MD, pt's stroke "would not necessarily explain patient's encephalopathy episodes". Per note, MD suspects possibly some underlying Wernicke's encephalopathy due to alcohol use/abuse and may be having some withdrawals, given the tachycardia. On CIWA protocol. Son present this session again.  ?  ?Pt appears to present w/ oropharyngeal phase dysphagia in setting of Edentulous status baseline, declined Pulmonary status, declined Cognitive status/awareness, and acuity of illness. These issues can impact overall awareness/timing of swallow and safety during po tasks which increases risk for aspiration, choking. He required Min-Mod verbal/tactile/visual cues for follow through during po tasks. ?  ?Pt fed and consumed trials of ice chips, purees, soft solids, and thin and Nectar liquids via Cup w/ overt clinical s/s of aspiration -- fairly consistent, congested coughing w/ trials of thin liquids x4-6 trials. Suspect delay in pharyngeal swallow initiation. Multiple, audible swallows noted intermittently. No immediate, overt coughing noted w/ trials of Nectar liquids via Cup, nor other consistencies. O2 sats remained mid90s w/ no overall decline in Pulmonary presentation during session. Oral phase was c/b adequate and timely bolus management and A-P transfer for swallowing w/ liquids  and purees; min-mod increased mashing/gumming effort noted w/ the increased textured foods - suspect impact from Edentulous status. Oral clearing appropriate w/ all consistencies. Pt seemed to exhibit decreased insight into his deficits.       ?  ?D/t pt's presentation today w/ risk for aspiration, recommend a Dysphagia level 2 diet(minced foods, moistened) w/ Nectar liquids via Cup; aspiration precautions. Tray setup and monitoring during meals for follow through w/ precautions. Reduce distractions at meals. MD/NSG updated. ST services recommends monitoring of status while admitted w/ f/u w/ trials to upgrade diet next 1-2 days; MBSS if indicated to objectively assess swallow function.  Son agreed. NSG updated.  ? ? ?  ?HPI HPI: Pt is a 66 y.o. male with history of alcohol abuse last drink yesterday, tobacco abuse, Emphysema, COPD, who recently admitted for diarrhea and COVID+ about a month ago was doing ok until yesterday when patient's son checked on him in the morning at around 10 AM when patient was initially unresponsive and later became more confused and at that time EMS was called.  Patient also was found to be short of breath taking deep breaths.   ED Course: In the ER patient is encephalopathic but waking up on calling his name.  Does not follow commands.  CT of Head revealed Chronic microvascular ischemic changes and atrophy in the cerebral white matter.  MRI revealed: 6 mm focus of mild diffusion signal abnormality involving the  left cerebellum, likely a small acute to early subacute ischemic  nonhemorrhagic infarct.  Labs show sodium of 128. Has chronic hyponatremia.  Pt was given antibiotics steroids and nebulizer.  Patient admitted for acute encephalopathy unresponsive episode shortness of breath.     CT of Chest: Lungs/Pleura: Moderate centrilobular Emphysema. No confluent  opacities or effusions. ?  ?   ?  SLP Plan ? Continue with current plan of care ? ?  ?  ?Recommendations for follow up therapy  are one component of a multi-disciplinary discharge planning process, led by the attending physician.  Recommendations may be updated based on patient status, additional functional criteria and insurance authorization. ?  ? ?Recommendations  ?Diet recommendations: Dysphagia 2 (fine chop);Nectar-thick liquid ?Liquids provided via: Cup;No straw ?Medication Administration: Crushed with puree ?Supervision: Patient able to self feed;Intermittent supervision to cue for compensatory strategies (setup; monitor) ?Compensations: Minimize environmental distractions;Slow rate;Small sips/bites;Lingual sweep for clearance of pocketing;Multiple dry swallows after each bite/sip;Follow solids with liquid ?Postural Changes and/or Swallow Maneuvers: Out of bed for meals;Seated upright 90 degrees;Upright 30-60 min after meal  ?   ?    ?   ? ? ? ? General recommendations:  (Dietician f/u) ?Oral Care Recommendations: Oral care BID;Oral care before and after PO;Oral care prior to ice chip/H20;Staff/trained caregiver to provide oral care ?Follow Up Recommendations: Skilled nursing-short term rehab (<3 hours/day) ?Assistance recommended at discharge: Frequent or constant Supervision/Assistance ?SLP Visit Diagnosis: Dysphagia, oropharyngeal phase (R13.12) (edentulous at baseline; unsure of Cognitive awareness of deficits; ETOH abuse/use) ?Plan: Continue with current plan of care ? ? ? ? ?  ?  ? ? ? ? ?Jerilynn Som, MS, CCC-SLP ?Speech Language Pathologist ?Rehab Services; [facility] ?[phone] (ascom) ?Michael Padilla ? ?01/05/2022, 4:08 PM ?

## 2022-01-05 NOTE — Assessment & Plan Note (Addendum)
it could be secondary to aspiration pneumonia ?S/p Unasyn x 5 days, switch to Augmentin for 2 more days to complete 7-day course.  WBC count back to within normal range ?Continue aspiration precautions ?SLP eval done, recommended dysphagia 2 nectar thick liquid diet ?Continue supplemental oxygenation and gradually wean off ?Currently patient is on 2 L oxygen via nasal cannula ?As per RN O2 saturation dropped to 86% on room air on exertion patient was placed back on 2 L oxygen via nasal cannula, we will reassess tomorrow, patient may need oxygen for home use ?

## 2022-01-05 NOTE — Progress Notes (Signed)
Neurology progress note ? ?S: Patient's mental status improved today, conversational, speaking to son at bedside. Oriented to self and hospital. ? ?Data: ? ?EEG: diffuse slowing, beta, no epileptiform abnl ? ?MRI brain wo: 63mm focus restricted diffusion L cerebellum, likely acute to subacute infarct, mild CSVID ? ?CTA H&N: no LVO, ~50% stenosis L vert ? ?CNS imaging personally reviewed ? ?TTE - no intracardiac clot, no etiology for stroke identified ? ?Labs ? ?Na+ 124 -> 138 ?Plts 561 ?UDS pan-neg ? ?B12 108 ?RPR neg ?TSH WNL ? ?Stroke Labs ? ?   ?Component Value Date/Time  ? LDLDIRECT 33.2 01/03/2022 0931  ? ? ?Lab Results  ?Component Value Date/Time  ? HGBA1C 5.2 01/03/2022 06:18 AM  ? ? ? ?O: ? ?Vitals:  ? 01/05/22 0400 01/05/22 0831  ?BP: (!) 160/106 (!) 151/103  ?Pulse: (!) 113 (!) 113  ?Resp: 17 18  ?Temp: 97.6 ?F (36.4 ?C) 97.6 ?F (36.4 ?C)  ?SpO2: 98% 95%  ? ? ?Physical Exam ?Gen: awake, alert, oriented to self and hospital, follows simple commands ?HEENT: Atraumatic, normocephalic; oropharynx clear, tongue without atrophy or fasciculations. ?Resp: CTAB, normal work of breathing ?CV: RRR, extremities appear well-perfused. ?Abd: soft/NT/ND ?Extrem: Nml bulk; no cyanosis, clubbing, or edema. ? ?Neuro: ?*MS: awake, alert, oriented to self and hospital, follows simple commands ?*Speech: mild-to-mod dysarthria ?*CN: PERRL, blinks to threat bilat, EOMI, face symmetric at rest, hearing intact to voice ?*Motor: anti-gravity with drift BUE equally, some movement against gravity BLE symmetric ?*Sensory: SILT ?*Reflexes: 1+ symmetric throughout, toes mute bilat ?*Coordination, gait:  deferred ? ?NIHSS ?1a Level of Conscious.: 0 ?1b LOC Questions: 2 ?1c LOC Commands: 0 ?2 Best Gaze: 0 ?3 Visual: 0 ?4 Facial Palsy: 0 ?5a Motor Arm - left: 1 ?5b Motor Arm - Right: 1 ?6a Motor Leg - Left: 2 ?6b Motor Leg - Right: 2 ?7 Limb Ataxia: 0 ?8 Sensory: 0 ?9 Best Language: 0 ?10 Dysarthria: 1 ?11 Extinct. and Inatten.: 0 ? ?TOTAL:  9 ? ?Premorbid mRS = 2 ? ?A/P: 66 year old male alcoholic presenting with AMS after initially being found unresponsive by family ?1. Exam reveals findings most consistent with a delirium with confabulation suggesting possible concurrent Wernicke encephalopathy ?2. MRI brain shows small acute to subacute L cerebellar ischemic infarct, which is favored to be incidental since this would not cause unilateral weakness, aphasia, seizure, or encephalopathy. ?3. Chronic hyponatremia slightly worse on admission at 124, now WNL at 138. ?2. Ammonia level normal at 30.  ?3. Estimated GFR of > 60.  ?4. Lactate normal at 1.7 ?5. Elevated WBC at 17.7. Evaluate for possible infection.  ?6. Thrombocytosis on CBC. ?7. DDx for presentation includes severe Wernicke encephalopathy, delirium tremens, unwitnessed EtOH withdrawal seizure with postictal state. He has no documented history of seizures, but his hyponatremia may have lowered the seizure threshold. EEG showed no epileptiform abnl, but seizure prior to presentation cannot be ruled out. ? ?Recommendations: ?- Pt completed 3 days of 500mg  thiamine IV q 8 hrs. Transition to 250mg  IV daily x5 days end date 3/19 f/b 100mg  po daily after that ?- CIWA protocol per primary team ?- NPO until advanced by SLP ?- ASA 81mg  daily + plavix 75mg  daily x21 days f/b ASA 81mg  daily monotherapy after that ?- Infarct appears subacute, no indication for permissive HTN, goal normotension but avoid hypotension. ?- No indication for statin given LDL 33 already at goal ?- STAT head CT for any decline in neurologic exam ?- PT/OT/SLP when able  to participate.  ?- Given continued (slow) improvement in mental status without fluctuation, no indication for repeat EEG at this time ?- Aggressive B12 supp for B12 108; 1058mcg daily x1 wk f/b weekly x4 wks ?- Please place ambulatory referral to neurology at time of hospital discharge ? ?Stroke workup is complete. Neurology to sign off, but please re-engage if  additional neurologic concerns arise. ? ?Su Monks, MD ?[facility] ?[phone] ? ?If 7pm- 7am, please page neurology on call as listed in [place]. ? ?

## 2022-01-05 NOTE — Progress Notes (Signed)
?Progress Note ? ? ?Patient: Michael Padilla [id number] DOB: Oct 13, 1956 DOA: 01/02/2022     3 ?DOS: the patient was seen and examined on 01/05/2022 ?  ?Brief hospital course: ?Patient is a 66 year old white male with past medical history of alcohol abuse, tobacco abuse and COPD hospitalized a month ago for COVID and diarrhea and was reportedly doing fine when checked on by his son the previous day, but on 3/13 morning, patient was found to be unresponsive and then woke up and was confused and EMS was called.  In the emergency room, patient was to be encephalopathic with a CT scan of head unremarkable, but MRI noting small subacute infarct.  Lab work noteworthy for some hyponatremia with sodium of 124 although patient has history of chronic hyponatremia.  He was noted to have a leukocytosis 17, platelet count of 6.5 and lactic acid level of 2.5 although urine was only weakly positive for infection and CT scan noted no evidence of PE or pneumonia.  Procalcitonin also mildly elevated 0.33 and patient was started on IV Unasyn.  Neurology consulted.  EEG unremarkable for acute seizure but noted to have signs consistent with moderate diffuse encephalopathy and nonspecific.  Echocardiogram done was unremarkable.  Patient placed in stepdown unit oriented to self only with garbled speech.  When he sleeps at times, oxygen saturations drop although he overall is not hypoxic when he is awake. ? ?By 3/14, patient improved.  He has woken up on his own and is more interactive although still confused and confabulates.  Lab work notes improvement in his sodium. ? ?Assessment and Plan: ?* Acute metabolic encephalopathy ?Unclear etiology.  May be a cause of effects from stroke.  Does not appear to be seizure activity.  There may be some underlying Wernicke's encephalopathy due to alcohol.  Monitor closely.  Patient more awake and interactive.  He does answer questions appropriately, but incorrectly and I suspect that he likely has  some confabulation likely from long-term alcohol use. ? ?Acute CVA (cerebrovascular accident) ([place]) ?Small 6 mm acute to early subacute ischemic infarct in the left cerebellum.  Would not necessarily explain patient's encephalopathy episodes. ?Patient was seen by neurology, recommended aspirin and Plavix for 21 days followed by aspirin monotherapy, no need of statin due to LDL 33 below the target level.  ?Follow SLP eval and advance diet ?Follow PT and OT eval for disposition plan ?We will discontinue Foley when patient is more ambulatory ? ?Alcohol abuse ?He may be having some withdrawals, given tachycardia.  On CIWA protocol.  Unclear when his last drink was. ?Discontinued IV Ativan which was scheduled for past 3 days ?3/15 started Valium tapering dose over 3 days ? ? ?COPD with acute exacerbation ([place]) ?Patient has history of tobacco abuse.  Not on oxygen at home.  His oxygen saturations here are good unless he falls asleep and then his oxygen saturations drop, especially due to mouth breathing.  Trying high flow oxygen to see if this can keep his saturations up.  ABG is not impressive and he is not acidotic with no CO2 retention. ?Currently patient is on 4 L oxygen via nasal cannula, we will continue to wean down as per improvement ? ?Hyponatremia ? In part due to patient's alcohol intake as well as dehydration.  Much improved from, gone from 124 to 133--138 currently.  Patient more clinically awake ? ?Urinary tract infection ?Possible urinary tract infection, UA positive ?Urine culture was never sent ?Procalcitonin negative ?Patient was started on Unasyn ? ? ? ?  Acute respiratory failure with hypoxia ([place]) ?it could be secondary to aspiration pneumonia ?Continue Unasyn ?Continue aspiration precautions ?Follow SLP eval ?Continue supplemental oxygenation and gradually wean off ? ? ? ? ? ?Nutrition Documentation   ? ?Flowsheet Row ED to Hosp-Admission (Current) from 01/02/2022 in [facility]   ?Nutrition Problem Severe Malnutrition  ?Etiology chronic illness  [COPD, ETOH and tobacco abuse]  ?Nutrition Goal Patient will meet greater than or equal to 90% of their needs  ?Interventions Refer to RD note for recommendations  ? ?  ? ? ?Subjective: No active issues overnight, patient is AOx3, gradually improving.  Still need supplemental oxygenation on 4 L currently, patient said that he is having some trouble walking, denied any other active issues no chest pain or palpitations ? ? ?Physical Exam: ?Vitals:  ? 01/05/22 0346 01/05/22 0400 01/05/22 0831 01/05/22 1138  ?BP:  (!) 160/106 (!) 151/103 (!) 152/101  ?Pulse: (!) 122 (!) 113 (!) 113 (!) 110  ?Resp:  17 18 18   ?Temp:  97.6 ?F (36.4 ?C) 97.6 ?F (36.4 ?C) (!) 97 ?F (36.1 ?C)  ?TempSrc:  Oral    ?SpO2:  98% 95% 98%  ?Weight:      ?Height:      ? ? ?General:  alert oriented to time, place, and person.  ?Appear in no distress, affect appropriate ?Eyes: PERRLA ?ENT: Oral Mucosa Clear, moist  ?Neck: no JVD,  ?Cardiovascular: S1 and S2 Present, no Murmur,  ?Respiratory: good respiratory effort, Bilateral Air entry equal and Decreased, b/l mild Crackles, no wheezes ?Abdomen: Bowel Sound present, Soft and no tenderness,  ?Skin: no rashes ?Extremities: no Pedal edema, no calf tenderness ?Neurologic: without any new focal findings ?Gait not checked due to patient safety concerns ? ? ?Data Reviewed: ?Reviewed labs ? ? ? ?Family Communication: 3/15 discussed with patient's son at bedside ? ?Disposition: ?Status is: Inpatient ?Remains inpatient appropriate because: Still requires supplemental O2 admission, on tapering dose of Valium possible EtOH withdrawal ?SLP eval is pending, diet need to be advanced.  PT and OT eval pending ?Still has Foley catheter ? ? ? Planned Discharge Destination: Home with Home Health vs SNF awaiting PT/OT eval  ? ?Time spent: 35 minutes ? ?Author: ?Val Riles, MD ?01/05/2022 2:58 PM ? ?For on call review [email or web address].  ?

## 2022-01-06 ENCOUNTER — Inpatient Hospital Stay: Payer: Medicare HMO

## 2022-01-06 DIAGNOSIS — G9341 Metabolic encephalopathy: Secondary | ICD-10-CM | POA: Diagnosis not present

## 2022-01-06 LAB — CBC
HCT: 42.1 % (ref 39.0–52.0)
Hemoglobin: 14 g/dL (ref 13.0–17.0)
MCH: 32.7 pg (ref 26.0–34.0)
MCHC: 33.3 g/dL (ref 30.0–36.0)
MCV: 98.4 fL (ref 80.0–100.0)
Platelets: 683 10*3/uL — ABNORMAL HIGH (ref 150–400)
RBC: 4.28 MIL/uL (ref 4.22–5.81)
RDW: 13.9 % (ref 11.5–15.5)
WBC: 8.2 10*3/uL (ref 4.0–10.5)
nRBC: 0 % (ref 0.0–0.2)

## 2022-01-06 LAB — PROCALCITONIN: Procalcitonin: <0.1 ng/mL

## 2022-01-06 LAB — GLUCOSE, CAPILLARY
Glucose-Capillary: 100 mg/dL — ABNORMAL HIGH (ref 70–99)
Glucose-Capillary: 202 mg/dL — ABNORMAL HIGH (ref 70–99)
Glucose-Capillary: 108 mg/dL — ABNORMAL HIGH (ref 70–99)
Glucose-Capillary: 126 mg/dL — ABNORMAL HIGH (ref 70–99)

## 2022-01-06 LAB — BASIC METABOLIC PANEL
Anion gap: 10 (ref 5–15)
BUN: 9 mg/dL (ref 8–23)
CO2: 28 mmol/L (ref 22–32)
Calcium: 8.6 mg/dL — ABNORMAL LOW (ref 8.9–10.3)
Chloride: 104 mmol/L (ref 98–111)
Creatinine, Ser: 0.48 mg/dL — ABNORMAL LOW (ref 0.61–1.24)
GFR, Estimated: >60 mL/min (ref >60)
Glucose, Bld: 107 mg/dL — ABNORMAL HIGH (ref 70–99)
Potassium: 3.3 mmol/L — ABNORMAL LOW (ref 3.5–5.1)
Sodium: 142 mmol/L (ref 135–145)

## 2022-01-06 LAB — VITAMIN B1: Vitamin B1 (Thiamine): 176.8 nmol/L (ref 66.5–200.0)

## 2022-01-06 LAB — MAGNESIUM: Magnesium: 2 mg/dL (ref 1.7–2.4)

## 2022-01-06 LAB — PHOSPHORUS: Phosphorus: 3.5 mg/dL (ref 2.5–4.6)

## 2022-01-06 MED ORDER — VITAMIN B-12 1000 MCG PO TABS: 500.0000 ug | ORAL_TABLET | Freq: Every day | ORAL | Status: DC

## 2022-01-06 MED ORDER — POTASSIUM CHLORIDE CRYS ER 20 MEQ PO TBCR
40.0000 meq | EXTENDED_RELEASE_TABLET | Freq: Once | ORAL | Status: AC
  Administered 2022-01-06: 40 meq via ORAL
  Filled 2022-01-06: qty 2

## 2022-01-06 MED ORDER — VITAMIN D (ERGOCALCIFEROL) 1.25 MG (50000 UNIT) PO CAPS
50000.0000 [IU] | ORAL_CAPSULE | ORAL | Status: DC
  Administered 2022-01-06: 50000 [IU] via ORAL
  Filled 2022-01-06: qty 1

## 2022-01-06 MED ORDER — NEPRO/CARBSTEADY PO LIQD
237.0000 mL | Freq: Three times a day (TID) | ORAL | Status: DC
  Administered 2022-01-06 – 2022-01-09 (×7): 237 mL via ORAL

## 2022-01-06 MED ORDER — AMLODIPINE BESYLATE 5 MG PO TABS
5.0000 mg | ORAL_TABLET | Freq: Every day | ORAL | Status: DC
  Administered 2022-01-06 – 2022-01-07 (×2): 5 mg via ORAL
  Filled 2022-01-06 (×2): qty 1

## 2022-01-06 NOTE — Progress Notes (Signed)
?Progress Note ? ? ?Patient: Michael Padilla [id number] DOB: [birth year] DOA: 01/02/2022     4 ?DOS: the patient was seen and examined on 01/06/2022 ?  ?Brief hospital course: ?Patient is a 66 year old white male with past medical history of alcohol abuse, tobacco abuse and COPD hospitalized a month ago for COVID and diarrhea and was reportedly doing fine when checked on by his son the previous day, but on 3/13 morning, patient was found to be unresponsive and then woke up and was confused and EMS was called.  In the emergency room, patient was to be encephalopathic with a CT scan of head unremarkable, but MRI noting small subacute infarct.  Lab work noteworthy for some hyponatremia with sodium of 124 although patient has history of chronic hyponatremia.  He was noted to have a leukocytosis 17, platelet count of 6.5 and lactic acid level of 2.5 although urine was only weakly positive for infection and CT scan noted no evidence of PE or pneumonia.  Procalcitonin also mildly elevated 0.33 and patient was started on IV Unasyn.  Neurology consulted.  EEG unremarkable for acute seizure but noted to have signs consistent with moderate diffuse encephalopathy and nonspecific.  Echocardiogram done was unremarkable.  Patient placed in stepdown unit oriented to self only with garbled speech.  When he sleeps at times, oxygen saturations drop although he overall is not hypoxic when he is awake. ? ?By 3/14, patient improved.  He has woken up on his own and is more interactive although still confused and confabulates.  Lab work notes improvement in his sodium. ? ?Assessment and Plan: ?* Acute metabolic encephalopathy ?Unclear etiology.  Could be due to CVA, hypoxia respiratory failure and EtOH abuse.  ?Patient is more awake and alert now, AO x3.  Encephalopathy resolved.  Patient is back to his baseline.  ? ? ? ?Acute CVA (cerebrovascular accident) ([place]) ?Small 6 mm acute to early subacute ischemic infarct in the left cerebellum.   Would not necessarily explain patient's encephalopathy episodes. ?Patient was seen by neurology, recommended aspirin and Plavix for 21 days followed by aspirin monotherapy, no need of statin due to LDL 33 below the target level.  ?SLP eval done, recommended dysphagia 2 nectar thick liquid diet  ?PT and OT eval done Rec HHPT  ?We will discontinue Foley when patient is more ambulatory ? ?Alcohol abuse ?He may be having some withdrawals, given tachycardia.  On CIWA protocol.  Unclear when his last drink was. ?Discontinued IV Ativan which was scheduled for past 3 days ?3/15 started Valium tapering dose over 3 days ? ? ?COPD with acute exacerbation ([place]) ?Patient has history of tobacco abuse.  Not on oxygen at home.  His oxygen saturations here are good unless he falls asleep and then his oxygen saturations drop, especially due to mouth breathing.  Trying high flow oxygen to see if this can keep his saturations up.  ABG is not impressive and he is not acidotic with no CO2 retention. ?Currently patient is on 3 L oxygen via nasal cannula, we will continue to wean down as per improvement ? ?Hyponatremia ? In part due to patient's alcohol intake as well as dehydration.  Much improved from 124 to 133--138 currently.  Patient more clinically awake ? ?Urinary tract infection ?Possible urinary tract infection, UA positive ?Urine culture was never sent ?Procalcitonin negative ?Patient was started on Unasyn ? ? ? ?Acute respiratory failure with hypoxia ([place]) ?it could be secondary to aspiration pneumonia ?Continue Unasyn ?Continue aspiration precautions ?SLP  eval done, recommended dysphagia 2 nectar thick liquid diet ?Continue supplemental oxygenation and gradually wean off ?Currently patient is on 3 L oxygen via nasal cannula ? ? ? ? ? ?Nutrition Documentation   ? ?Flowsheet Row ED to Hosp-Admission (Current) from 01/02/2022 in [facility]  ?Nutrition Problem Severe Malnutrition  ?Etiology chronic illness   [COPD, ETOH and tobacco abuse]  ?Nutrition Goal Patient will meet greater than or equal to 90% of their needs  ?Interventions Refer to RD note for recommendations  ? ?  ? ? ?Subjective: No active issues overnight, patient is Aox3, patient was able to work with PT and OT, stated that he was able to walk alertable with a walker.  Patient was sitting in the recliner, feels improvement.  Still using supplemental O2 inhalation 3 L oxygen via nasal cannula. ?Patient denied any chest pain or palpitations, no abdominal pain ? ? ?Physical Exam: ?Vitals:  ? 01/06/22 0126 01/06/22 0533 01/06/22 0822 01/06/22 1133  ?BP: (!) 159/95 (!) 159/95 (!) 162/107 (!) 162/103  ?Pulse: 94 89 96 95  ?Resp:  20 19 19   ?Temp: 98.2 ?F (36.8 ?C) 97.8 ?F (36.6 ?C) 97.6 ?F (36.4 ?C) (!) 97.5 ?F (36.4 ?C)  ?TempSrc: Oral Oral    ?SpO2: 98% 97% 96% 93%  ?Weight:      ?Height:      ? ? ?General:  alert oriented to time, place, and person.  ?Appear in no distress, affect appropriate ?Eyes: PERRLA ?ENT: Oral Mucosa Clear, moist  ?Neck: no JVD,  ?Cardiovascular: S1 and S2 Present, no Murmur,  ?Respiratory: good respiratory effort, Bilateral Air entry equal and Decreased, b/l mild Crackles, no wheezes ?Abdomen: Bowel Sound present, Soft and no tenderness,  ?Skin: no rashes ?Extremities: no Pedal edema, no calf tenderness ?Neurologic: without any new focal findings ?Gait not checked due to patient safety concerns ? ? ?Data Reviewed: ?Reviewed labs ? ? ? ?Family Communication: 3/15 discussed with patient's son at bedside ? ?Disposition: ?Status is: Inpatient ?Remains inpatient appropriate because: Still requires supplemental O2 inhalation, on tapering dose of Valium possible EtOH withdrawal ?SLP eval done dysphagia 2 diet with nectar thick liquids, PT/OT eval done recommended home health PT  ?Still has Foley catheter, plan to DC Foley catheter tomorrow a.m. ? ? ? Planned Discharge Destination: Home with Home Health PT in 1-2 days   ? ?Time spent: 35  minutes ? ?Author: ?Val Riles, MD ?01/06/2022 2:23 PM ? ?For on call review [email or web address].  ?

## 2022-01-06 NOTE — Progress Notes (Signed)
Mobility Specialist - Progress Note ? ? 01/06/22 1400  ?Mobility  ?Activity Transferred from chair to bed  ?Level of Assistance Minimal assist, patient does 75% or more  ?Assistive Device Other (Comment)  ?Distance Ambulated (ft) 2 ft  ?Activity Response Tolerated well  ?$Mobility charge 1 Mobility  ? ? ? ?Pt transferred chair-bed with HHA. Alarm set, needs in reach.  ? ? ?Filiberto Pinks ?Mobility Specialist ?01/06/22, 2:27 PM ? ? ? ? ?

## 2022-01-06 NOTE — Progress Notes (Signed)
Nutrition Follow-up ? ?DOCUMENTATION CODES:  ? ?Severe malnutrition in context of chronic illness ? ?INTERVENTION:  ? ?-Nepro Shake po TID, each supplement provides 425 kcal and 19 grams protein  ?-MVI with minerals daily ? ?NUTRITION DIAGNOSIS:  ? ?Severe Malnutrition related to chronic illness (COPD, ETOH and tobacco abuse) as evidenced by moderate fat depletion, severe fat depletion, moderate muscle depletion, severe muscle depletion. ? ?Ongoing ? ?GOAL:  ? ?Patient will meet greater than or equal to 90% of their needs ? ?Progressing ? ?MONITOR:  ? ?PO intake, Supplement acceptance, Diet advancement, Labs, Weight trends, Skin, I & O's ? ?REASON FOR ASSESSMENT:  ? ?Consult ?Assessment of nutrition requirement/status ? ?ASSESSMENT:  ? ?Michael Padilla is a 66 y.o. male with history of alcohol abuse last drink yesterday, tobacco abuse COPD recently admitted for diarrhea and COVID about a month ago was doing fine until yesterday when patient's son checked on him this morning at around 10 AM when patient was initially unresponsive and later became more confused and at that time EMS was called.  Patient also was found to be short of breath taking deep breaths. ? ?3/15- s/p BSE- advanced to dysphagia 2 diet with nectar thick liquids ? ?Reviewed I/O's: +210 ml x 24 hours and +71 ml since admission ? ?UOP: 250 ml x 24 hours ? ?Pt working with therapy at time of visit.  ? ?Pt just advanced to a dysphagia 2 diet with nectar thick liquids. Pt tolerating diet well. Noted meal completions 100%. RD will add supplements to help pt meet nutritional needs.   ? ?Medications reviewed and include vitamin B-12, folci acid, thiamine, vitamin B-12, and vitamin D. ? ?Labs reviewed: K: 3.3, CBGS: 100-202.   ? ?Diet Order:   ?Diet Order   ? ?       ?  DIET DYS 2 Room service appropriate? Yes with Assist; Fluid consistency: Nectar Thick  Diet effective now       ?  ? ?  ?  ? ?  ? ? ?EDUCATION NEEDS:  ? ?Education needs have been  addressed ? ?Skin:  Skin Assessment: Reviewed RN Assessment ? ?Last BM:  Unknown ? ?Height:  ? ?Ht Readings from Last 1 Encounters:  ?01/02/22 5\' 8"  (1.727 m)  ? ? ?Weight:  ? ?Wt Readings from Last 1 Encounters:  ?01/02/22 56 kg  ? ? ?Ideal Body Weight:  70 kg ? ?BMI:  Body mass index is 18.77 kg/m?. ? ?Estimated Nutritional Needs:  ? ?Kcal:  1950-2150 ? ?Protein:  100-115 grams ? ?Fluid:  > 1.9 L ? ? ? ?03/04/22, RD, LDN, CDCES ?Registered Dietitian II ?Certified Diabetes Care and Education Specialist ?Please refer to [facility] for RD and/or RD on-call/weekend/after hours pager  ?

## 2022-01-06 NOTE — Evaluation (Signed)
Physical Therapy Evaluation ?Patient Details ?Name: Michael Padilla ?MRN: [id number] ?DOB: Feb 08, 1956 ?Today's Date: 01/06/2022 ? ?History of Present Illness ? 66 year old male admitted after fall with AMS recently hospitalized for COVID and diarrhea. PMH significant for alcohol abuse, tobacco abuse and COPD. ?  ?Clinical Impression ? Pt admitted with above diagnosis. Pt received supine in bed agreeable to PT intervention. Pt has doffed HFNC to eat. Able to report PLOF, DME, home set up accurately reporting he is indep to mod-I with RW in household and performs ADL's indep with intermittent assist from son for IADL's, transportation, and meals. Pt at 86% SPO2 on RA so education provided with donning of HFNC on 3L/min for importance of need for O2. Required 2 min seated rest EOB with PLB prior to attaining >/= 90%. Pt supervision to reach EoB with HOB elevated and increased time. Hip flexion, knee extension grossly 3/5 via MMT and able to stand to RW with safe technique and ambulate 10' with RW with miguard prior to sitting in recliner. Pt limited in progression of gait due to HR up to 140 BPM with ambulation with step to pattern and antalgic gait on RLE in which pt reports soreness. Safe sequencing and use of RW appreciated with ambulation and turns, however required min VC's for maintaining RW closer to pt's BOS due to keeping RW anteriorly extended in front of him. Good carryover after VC's. HR returns to low 120's BPM which was resting prior to session with SPO2 maintaining at 90-91% on 3 L HFNC. All needs in reach for pt. Pt displaying good bed mobility and safe transfers and use of RW. Anticipate with progression of gait during acute stay with HR control, pt will be safe to D/c home. Pt currently with functional limitations due to the deficits listed below (see PT Problem List). Pt will benefit from skilled PT to increase their independence and safety with mobility to allow discharge to the venue listed below.    ?   ? ?Recommendations for follow up therapy are one component of a multi-disciplinary discharge planning process, led by the attending physician.  Recommendations may be updated based on patient status, additional functional criteria and insurance authorization. ? ?Follow Up Recommendations Home health PT ? ?  ?Assistance Recommended at Discharge Intermittent Supervision/Assistance  ?Patient can return home with the following ? A little help with walking and/or transfers;Assistance with cooking/housework;Assist for transportation;Help with stairs or ramp for entrance ? ?  ?Equipment Recommendations None recommended by PT  ?Recommendations for Other Services ?    ?  ?Functional Status Assessment Patient has had a recent decline in their functional status and demonstrates the ability to make significant improvements in function in a reasonable and predictable amount of time.  ? ?  ?Precautions / Restrictions Precautions ?Precautions: Fall ?Restrictions ?Weight Bearing Restrictions: No  ? ?  ? ?Mobility ? Bed Mobility ?Overal bed mobility: Needs Assistance ?Bed Mobility: Supine to Sit ?  ?  ?Supine to sit: Supervision, HOB elevated ?  ?  ?General bed mobility comments: increased time ?Patient Response: Cooperative ? ?Transfers ?  ?Equipment used: Rolling walker (2 wheels) ?Transfers: Sit to/from Stand ?Sit to Stand: Min guard ?  ?  ?  ?  ?  ?General transfer comment: safe hand placement ?  ? ?Ambulation/Gait ?Ambulation/Gait assistance: Min guard ?Gait Distance (Feet): 10 Feet ?Assistive device: Rolling walker (2 wheels) ?Gait Pattern/deviations: Step-to pattern, Antalgic ?  ?  ?  ?General Gait Details: Gait limited due to  heart rate up to 140 BPM. Good stability with RW with limited gait. ? ?Stairs ?  ?  ?  ?  ?  ? ?Wheelchair Mobility ?  ? ?Modified Rankin (Stroke Patients Only) ?  ? ?  ? ?Balance Overall balance assessment: Needs assistance ?Sitting-balance support: Feet supported ?Sitting balance-Leahy Scale:  Good ?  ?  ?Standing balance support: Bilateral upper extremity supported, Reliant on assistive device for balance, Single extremity supported ?Standing balance-Leahy Scale: Good ?  ?  ?  ?  ?  ?  ?  ?  ?  ?  ?  ?  ?   ? ? ? ?Pertinent Vitals/Pain Pain Assessment ?Pain Assessment: Faces ?Faces Pain Scale: Hurts little more ?Pain Location: RLE ?Pain Descriptors / Indicators: Aching ?Pain Intervention(s): Monitored during session, Limited activity within patient's tolerance, Repositioned  ? ? ?Home Living Family/patient expects to be discharged to:: Private residence ?Living Arrangements: Children;Other (Comment) ?Available Help at Discharge: Family;Available PRN/intermittently ?Type of Home: Apartment ?Home Access: Level entry ?  ?  ?  ?Home Layout: One level ?Home Equipment: Grab bars - toilet;Grab bars - tub/shower;Rolling Walker (2 wheels);Cane - single point ?   ?  ?Prior Function Prior Level of Function : Independent/Modified Independent ?  ?  ?  ?  ?  ?  ?Mobility Comments: reports indep with no AD ?ADLs Comments: MOD I-I in ADL; son assists with IADLs as needed but typically mod I-I; does not drive, neighbor or other son drives ?  ? ? ?Hand Dominance  ? Dominant Hand: Right ? ?  ?Extremity/Trunk Assessment  ? Upper Extremity Assessment ?Upper Extremity Assessment: Generalized weakness ?  ? ?Lower Extremity Assessment ?Lower Extremity Assessment: Generalized weakness ?  ? ?   ?Communication  ? Communication: No difficulties  ?Cognition Arousal/Alertness: Awake/alert ?Behavior During Therapy: [facility] for tasks assessed/performed ?Overall Cognitive Status: No family/caregiver present to determine baseline cognitive functioning ?  ?  ?  ?  ?  ?  ?  ?  ?  ?  ?  ?  ?  ?  ?  ?  ?  ?  ?  ? ?  ?General Comments General comments (skin integrity, edema, etc.): HR up to mid 120's at rest. Up to 140 BPM with exercise. Initiallly found on RA with SPO2 at 86%. Titrated on HFNC to 3 L/min returned to 90%. ? ?  ?Exercises General  Exercises - Lower Extremity ?Long Arc Quad: AROM, Strengthening, Both, 10 reps ?Hip Flexion/Marching: AROM, Strengthening, Both, 10 reps, Seated ?Other Exercises ?Other Exercises: Role of PT in acute setting, need for AD and O2, d/c recs.  ? ?Assessment/Plan  ?  ?PT Assessment Patient needs continued PT services  ?PT Problem List Decreased strength;Decreased balance;Pain;Decreased mobility;Decreased activity tolerance ? ?   ?  ?PT Treatment Interventions DME instruction;Therapeutic activities;Gait training;Therapeutic exercise;Patient/family education;Balance training;Functional mobility training;Neuromuscular re-education   ? ?PT Goals (Current goals can be found in the Care Plan section)  ?Acute Rehab PT Goals ?Patient Stated Goal: to go home ?PT Goal Formulation: With patient ?Time For Goal Achievement: 01/20/22 ?Potential to Achieve Goals: Good ? ?  ?Frequency Min 2X/week ?  ? ? ?Co-evaluation   ?  ?  ?  ?  ? ? ?  ?AM-PAC PT "6 Clicks" Mobility  ?Outcome Measure Help needed turning from your back to your side while in a flat bed without using bedrails?: A Little ?Help needed moving from lying on your back to sitting on the side of a flat  bed without using bedrails?: A Little ?Help needed moving to and from a bed to a chair (including a wheelchair)?: A Little ?Help needed standing up from a chair using your arms (e.g., wheelchair or bedside chair)?: A Little ?Help needed to walk in hospital room?: A Little ?Help needed climbing 3-5 steps with a railing? : A Lot ?6 Click Score: 17 ? ?  ?End of Session Equipment Utilized During Treatment: Gait belt;Oxygen ?Activity Tolerance: Treatment limited secondary to medical complications (Comment) (tachycardia) ?Patient left: in chair;with call bell/phone within reach;with chair alarm set ?Nurse Communication: Mobility status ?PT Visit Diagnosis: Other abnormalities of gait and mobility (R26.89);Difficulty in walking, not elsewhere classified (R26.2);Muscle weakness  (generalized) (M62.81);Pain ?Pain - Right/Left: Right ?Pain - part of body: Leg ?  ? ?Time: [id number] ?PT Time Calculation (min) (ACUTE ONLY): 25 min ? ? ?Charges:   PT Evaluation ?$PT Eval Moderate Complexity: 1

## 2022-01-06 NOTE — Evaluation (Signed)
Occupational Therapy Evaluation ?Patient Details ?Name: Michael Padilla ?MRN: [id number] ?DOB: 1956/06/24 ?Today's Date: 01/06/2022 ? ? ?History of Present Illness 66 year old male admitted after fall with AMS recently hospitalized for COVID and diarrhea. PMH significant for alcohol abuse, tobacco abuse and COPD  ? ?Clinical Impression ?  ?Chart reviewed, RN cleared pt for participation in OT evaluation. Pt is alert and oriented x4, good 1 step direction following, fair multi step direction following; OT will continue to assess. Per pt report he lives with his son and was MOD I-I in all ADL/IADL with assistance provided for driving. X ray in room for chest x ray prior to mobility/ADL task completion. Pt is performing below PLOF in ADL status requiring MIN A for bathing tasks, CGA for toileting  hygiene. SET UP required for grooming, UB bathing tasks. Pt would benefit from skilled OT to address functional deficits and to improve safe discharge home. Pt is left as received, NAD all needs met. Recommend discharge with HHOT to address functional deficits. OT will continue to follow acutely.  ?   ? ?Recommendations for follow up therapy are one component of a multi-disciplinary discharge planning process, led by the attending physician.  Recommendations may be updated based on patient status, additional functional criteria and insurance authorization.  ? ?Follow Up Recommendations ? Home health OT  ?  ?Assistance Recommended at Discharge Frequent or constant Supervision/Assistance  ?Patient can return home with the following Assistance with cooking/housework;A little help with walking and/or transfers;A little help with bathing/dressing/bathroom;Help with stairs or ramp for entrance;Direct supervision/assist for medications management;Assist for transportation ? ?  ?Functional Status Assessment ? Patient has had a recent decline in their functional status and demonstrates the ability to make significant improvements in  function in a reasonable and predictable amount of time.  ?Equipment Recommendations ? None recommended by OT;Other (comment) (pt has shower chair)  ?  ?Recommendations for Other Services   ? ? ?  ?Precautions / Restrictions Precautions ?Precautions: Fall ?Restrictions ?Weight Bearing Restrictions: No  ? ?  ? ?Mobility Bed Mobility ?  ?  ?  ?  ?  ?  ?  ?General bed mobility comments: pt in chair at start of evaluation ?  ? ?Transfers ?Overall transfer level: Needs assistance ?Equipment used: Rolling walker (2 wheels) ?Transfers: Sit to/from Stand ?Sit to Stand: Min guard, Min assist ?  ?  ?  ?  ?  ?General transfer comment: due to RLE pain ?  ? ?  ?Balance Overall balance assessment: Needs assistance ?Sitting-balance support: Feet supported ?Sitting balance-Leahy Scale: Good ?  ?  ?Standing balance support: Bilateral upper extremity supported, Reliant on assistive device for balance, Single extremity supported ?Standing balance-Leahy Scale: Good ?  ?  ?  ?  ?  ?  ?  ?  ?  ?  ?  ?  ?   ? ?ADL either performed or assessed with clinical judgement  ? ?ADL Overall ADL's : Needs assistance/impaired ?Eating/Feeding: Set up ?  ?Grooming: Wash/dry hands;Wash/dry face;Set up;Sitting ?  ?Upper Body Bathing: Minimal assistance;Sitting ?  ?Lower Body Bathing: Minimal assistance;Sit to/from stand ?  ?Upper Body Dressing : Sitting;Set up ?  ?  ?  ?Toilet Transfer: Min guard;Rolling walker (2 wheels) ?Toilet Transfer Details (indicate cue type and reason): simulated ?Toileting- Water quality scientist and Hygiene: Min guard ?Toileting - Clothing Manipulation Details (indicate cue type and reason): for peri care reaching posteriorly with RW ?  ?  ?Functional mobility during ADLs: Rolling walker (  2 wheels);Min guard ?   ? ? ? ?Vision Patient Visual Report: No change from baseline ?   ?   ?Perception   ?  ?Praxis   ?  ? ?Pertinent Vitals/Pain Pain Assessment ?Pain Assessment: Faces ?Faces Pain Scale: Hurts little more ?Pain Location:  RLE ?Pain Descriptors / Indicators: Aching ?Pain Intervention(s): Limited activity within patient's tolerance, Monitored during session, Repositioned  ? ? ? ?Hand Dominance Right ?  ?Extremity/Trunk Assessment Upper Extremity Assessment ?Upper Extremity Assessment: Generalized weakness (4-/5 throughout BUE) ?  ?Lower Extremity Assessment ?Lower Extremity Assessment: Defer to PT evaluation ?  ?  ?  ?Communication Communication ?Communication: No difficulties ?  ?Cognition Arousal/Alertness: Awake/alert ?Behavior During Therapy: [facility] for tasks assessed/performed ?Overall Cognitive Status: Impaired/Different from baseline ?Area of Impairment: Orientation, Attention, Following commands, Safety/judgement, Awareness ?  ?  ?  ?  ?  ?  ?  ?  ?Orientation Level: Disoriented to, Time ?Current Attention Level: Selective ?  ?Following Commands: Follows one step commands consistently, Follows multi-step commands inconsistently ?Safety/Judgement: Decreased awareness of safety ?Awareness: Emergent ?  ?  ?  ?  ?General Comments  HR 124 at start of session therefore amb not progressed, HR up to 130 with activity, resting at 120 at end of session. SPO2 >90% throughout via 3 L [place] ? ?  ?Exercises Other Exercises ?Other Exercises: education re: role of OT, role of rehab, home safety, orientation ?  ?Shoulder Instructions    ? ? ?Home Living Family/patient expects to be discharged to:: Private residence ?Living Arrangements: Children;Other (Comment) (son) ?Available Help at Discharge: Family;Available PRN/intermittently ?Type of Home: Apartment ?Home Access: Level entry ?  ?  ?Home Layout: One level ?  ?  ?Bathroom Shower/Tub: Tub/shower unit ?  ?Bathroom Toilet: Standard ?Bathroom Accessibility: Yes ?  ?Home Equipment: Grab bars - toilet;Grab bars - tub/shower;Rolling Walker (2 wheels);Cane - single point ?  ?  ?  ? ?  ?Prior Functioning/Environment   ?  ?  ?  ?  ?  ?  ?Mobility Comments: reports indep with no AD ?ADLs Comments: MOD I-I  in ADL; son assists with IADLs as needed but typically mod I-I; does not drive, neighbor or other son drives ?  ? ?  ?  ?OT Problem List: Decreased strength;Decreased knowledge of use of DME or AE;Decreased activity tolerance;Decreased cognition;Impaired balance (sitting and/or standing);Decreased safety awareness ?  ?   ?OT Treatment/Interventions: Self-care/ADL training;Therapeutic exercise;DME and/or AE instruction;Patient/family education;Therapeutic activities  ?  ?OT Goals(Current goals can be found in the care plan section) Acute Rehab OT Goals ?Patient Stated Goal: go home ?OT Goal Formulation: With patient ?Time For Goal Achievement: 01/20/22 ?Potential to Achieve Goals: Good ?ADL Goals ?Pt Will Perform Grooming: with modified independence ?Pt Will Perform Upper Body Dressing: with modified independence ?Pt Will Perform Lower Body Dressing: with modified independence ?Pt Will Transfer to Toilet: with modified independence ?Pt Will Perform Toileting - Clothing Manipulation and hygiene: with modified independence  ?OT Frequency: Min 2X/week ?  ? ?Co-evaluation   ?  ?  ?  ?  ? ?  ?AM-PAC OT "6 Clicks" Daily Activity     ?Outcome Measure Help from another person eating meals?: None ?Help from another person taking care of personal grooming?: A Little ?Help from another person toileting, which includes using toliet, bedpan, or urinal?: A Lot ?Help from another person bathing (including washing, rinsing, drying)?: A Little ?Help from another person to put on and taking off regular upper body clothing?: A  Little ?Help from another person to put on and taking off regular lower body clothing?: A Lot ?6 Click Score: 17 ?  ?End of Session Equipment Utilized During Treatment: Rolling walker (2 wheels) ?Nurse Communication: Mobility status ? ?Activity Tolerance: Patient tolerated treatment well ?Patient left: in chair;with call bell/phone within reach;with chair alarm set ? ?OT Visit Diagnosis: History of falling  (Z91.81)  ?              ?Time: 2258-3462 ?OT Time Calculation (min): 29 min ?Charges:  OT General Charges ?$OT Visit: 1 Visit ?OT Evaluation ?$OT Eval Low Complexity: 1 Low ?OT Treatments ?$Self Care/Home Manag

## 2022-01-07 ENCOUNTER — Other Ambulatory Visit: Payer: Self-pay

## 2022-01-07 DIAGNOSIS — E559 Vitamin D deficiency, unspecified: Secondary | ICD-10-CM

## 2022-01-07 DIAGNOSIS — E538 Deficiency of other specified B group vitamins: Secondary | ICD-10-CM

## 2022-01-07 DIAGNOSIS — I10 Essential (primary) hypertension: Secondary | ICD-10-CM

## 2022-01-07 LAB — GLUCOSE, CAPILLARY
Glucose-Capillary: 101 mg/dL — ABNORMAL HIGH (ref 70–99)
Glucose-Capillary: 118 mg/dL — ABNORMAL HIGH (ref 70–99)
Glucose-Capillary: 124 mg/dL — ABNORMAL HIGH (ref 70–99)
Glucose-Capillary: 124 mg/dL — ABNORMAL HIGH (ref 70–99)
Glucose-Capillary: 133 mg/dL — ABNORMAL HIGH (ref 70–99)

## 2022-01-07 LAB — BASIC METABOLIC PANEL
Anion gap: 7 (ref 5–15)
BUN: 11 mg/dL (ref 8–23)
CO2: 32 mmol/L (ref 22–32)
Calcium: 8.9 mg/dL (ref 8.9–10.3)
Chloride: 105 mmol/L (ref 98–111)
Creatinine, Ser: 0.38 mg/dL — ABNORMAL LOW (ref 0.61–1.24)
GFR, Estimated: >60 mL/min (ref >60)
Glucose, Bld: 108 mg/dL — ABNORMAL HIGH (ref 70–99)
Potassium: 3.7 mmol/L (ref 3.5–5.1)
Sodium: 144 mmol/L (ref 135–145)

## 2022-01-07 LAB — CBC
HCT: 45.7 % (ref 39.0–52.0)
Hemoglobin: 14.8 g/dL (ref 13.0–17.0)
MCH: 32.2 pg (ref 26.0–34.0)
MCHC: 32.4 g/dL (ref 30.0–36.0)
MCV: 99.6 fL (ref 80.0–100.0)
Platelets: 715 10*3/uL — ABNORMAL HIGH (ref 150–400)
RBC: 4.59 MIL/uL (ref 4.22–5.81)
RDW: 13.9 % (ref 11.5–15.5)
WBC: 7.7 10*3/uL (ref 4.0–10.5)
nRBC: 0 % (ref 0.0–0.2)

## 2022-01-07 LAB — CULTURE, BLOOD (SINGLE)
Culture: NO GROWTH
Special Requests: ADEQUATE

## 2022-01-07 LAB — PHOSPHORUS: Phosphorus: 4.1 mg/dL (ref 2.5–4.6)

## 2022-01-07 LAB — MAGNESIUM: Magnesium: 2.4 mg/dL (ref 1.7–2.4)

## 2022-01-07 MED ORDER — AMLODIPINE BESYLATE 10 MG PO TABS
10.0000 mg | ORAL_TABLET | Freq: Every day | ORAL | Status: DC
  Administered 2022-01-08 – 2022-01-09 (×2): 10 mg via ORAL
  Filled 2022-01-07 (×2): qty 1

## 2022-01-07 MED ORDER — AMOXICILLIN-POT CLAVULANATE 875-125 MG PO TABS
1.0000 | ORAL_TABLET | Freq: Two times a day (BID) | ORAL | Status: DC
  Administered 2022-01-07 – 2022-01-09 (×4): 1 via ORAL
  Filled 2022-01-07 (×4): qty 1

## 2022-01-07 MED ORDER — UMECLIDINIUM BROMIDE 62.5 MCG/ACT IN AEPB
1.0000 | INHALATION_SPRAY | Freq: Every day | RESPIRATORY_TRACT | Status: DC
  Administered 2022-01-07 – 2022-01-09 (×3): 1 via RESPIRATORY_TRACT
  Filled 2022-01-07: qty 7

## 2022-01-07 MED ORDER — FLUTICASONE FUROATE-VILANTEROL 200-25 MCG/ACT IN AEPB
1.0000 | INHALATION_SPRAY | Freq: Every day | RESPIRATORY_TRACT | Status: DC
  Administered 2022-01-07 – 2022-01-09 (×3): 1 via RESPIRATORY_TRACT
  Filled 2022-01-07: qty 28

## 2022-01-07 MED ORDER — ALBUTEROL SULFATE (2.5 MG/3ML) 0.083% IN NEBU: 3.0000 mL | INHALATION_SOLUTION | Freq: Four times a day (QID) | RESPIRATORY_TRACT | Status: DC | PRN

## 2022-01-07 NOTE — Assessment & Plan Note (Addendum)
3/15 started metoprolol 50 mg p.o. twice daily ?3/17 increase amlodipine from 5 to 10 mg p.o. daily due to persistent high blood pressure ?We will continue monitor BP and titrate medications accordingly ?

## 2022-01-07 NOTE — TOC Initial Note (Addendum)
Transition of Care (TOC) - Initial/Assessment Note  ? ? ?Patient Details  ?Name: Michael Padilla ?MRN: [id number] ?Date of Birth: May 11, 1956 ? ?Transition of Care (TOC) CM/SW Contact:    ?Alberteen Sam, LCSW ?Phone Number: ?01/07/2022, 10:27 AM ? ?Clinical Narrative:                 ? ?CSW met with patient at bedside to review [facility] recommendations, patient in agreement with [facility] at dc. Reports son Michael Padilla will transport home at time of discharge .Reports he has a walker and all DME needs at home. States he does have home O2 concentrator and 5 tanks at home incase he needs it but does not remember name of agency.  ? ?Referral sent to [facility] for [facility], Malachy Mood accepted. ? ?Expected Discharge Plan: [place] ?Barriers to Discharge: Continued Medical Work up ? ? ?Patient Goals and CMS Choice ?Patient states their goals for this hospitalization and ongoing recovery are:: to go home ?CMS Medicare.gov Compare Post Acute Care list provided to:: Patient ?Choice offered to / list presented to : Patient ? ?Expected Discharge Plan and Services ?Expected Discharge Plan: [place] ?  ?  ?  ?Living arrangements for the past 2 months: [place] ?                ?  ?  ?  ?  ?  ?HH Arranged: PT ?  ?Date HH Agency Contacted: 01/07/22 ?  ?  ? ?Prior Living Arrangements/Services ?Living arrangements for the past 2 months: [place] ?Lives with:: Self ?  ?       ?  ?  ?  ?  ? ?Activities of Daily Living ?  ?  ? ?Permission Sought/Granted ?  ?  ?   ?   ?   ?   ? ?Emotional Assessment ?  ?  ?  ?Orientation: : Oriented to Self, Oriented to Place, Oriented to  Time, Oriented to Situation ?Alcohol / Substance Use: Not Applicable ?Psych Involvement: No (comment) ? ?Admission diagnosis:  Hyponatremia [E87.1] ?Hypoxia [R09.02] ?Unresponsive episode [R41.89] ?Acute encephalopathy [G93.40] ?Altered mental status, unspecified altered mental status type [R41.82] ?Community acquired pneumonia,  unspecified laterality [J18.9] ?Patient Active Problem List  ? Diagnosis Date Noted  ? Protein-calorie malnutrition, severe 01/05/2022  ? Acute respiratory failure with hypoxia ([place]) 01/05/2022  ? Acute metabolic encephalopathy 00/93/8182  ? Acute CVA (cerebrovascular accident) ([place]) 01/03/2022  ? Urinary tract infection 01/03/2022  ? Alcohol abuse 01/03/2022  ? Unresponsive episode 01/02/2022  ? Acute encephalopathy 01/02/2022  ? Acute hypoxemic respiratory failure due to COVID-19 [facility]) 11/19/2020  ? Gastroenteritis due to COVID-19 virus 11/17/2020  ? Pneumonia due to COVID-19 virus 11/17/2020  ? Hyponatremia 11/17/2020  ? Generalized weakness 11/17/2020  ? COPD with acute exacerbation ([place]) 11/17/2020  ? SOB (shortness of breath) 01/16/2016  ? ?PCP:  Patient, No Pcp Per (Inactive) ?Pharmacy:   ?[facility] [address] [place], [place] - [address] ?[address] ?[place] [address] ?Phone: [phone] Fax: [phone] ? ?[id number]?Phone: [phone]: [phone]? ? ? ? ?Social Determinants of Health (SDOH) Interventions ?  ? ?Readmission Risk Interventions ?No flowsheet data found. ? ? ?
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
## 2022-01-07 NOTE — Assessment & Plan Note (Signed)
Started vitamin D 50,000 units p.o. weekly, follow with PCP to repeat vitamin D level after 3 months ?

## 2022-01-07 NOTE — Progress Notes (Signed)
Physical Therapy Treatment ?Patient Details ?Name: Michael Padilla ?MRN: [id number] ?DOB: 1956/01/05 ?Today's Date: 01/07/2022 ? ? ?History of Present Illness 66 year old male admitted after fall with AMS recently hospitalized for COVID and diarrhea. PMH significant for alcohol abuse, tobacco abuse and COPD ? ?  ?PT Comments  ? ? Pt received in bed on 3L O2 via [place] at 94% which decreased with minimal activity to 90% however no significant SOB noted. Pt able to transition to EOB and stand from bed with Supervision. Short distance mobility/gait in room with support of RW and CGA due to high fall risk. Pt noted to have 3+/5 strength R LE causing slight buckling in standing and decreased heel strike with gait - new onset this admission.  Pt states he has a RW at home and agrees to HHPT. Pt also states his son will be there to assist with safe mobility as he did during his recent COVID illness.  Pt without c/o dizziness once up in chair with BP 135/88 (101), HR 87bpm,  O2 sats 94% on RA. Cal bell in reach, chair alarm on. Continue to recommend HHPT once medically cleared.  ?  ?Recommendations for follow up therapy are one component of a multi-disciplinary discharge planning process, led by the attending physician.  Recommendations may be updated based on patient status, additional functional criteria and insurance authorization. ? ?Follow Up Recommendations ? Home health PT ?  ?  ?Assistance Recommended at Discharge Intermittent Supervision/Assistance  ?Patient can return home with the following A little help with walking and/or transfers;Assistance with cooking/housework;Assist for transportation;Help with stairs or ramp for entrance ?  ?Equipment Recommendations ? None recommended by PT  ?  ?Recommendations for Other Services   ? ? ?  ?Precautions / Restrictions Precautions ?Precautions: Fall ?Restrictions ?Weight Bearing Restrictions: No  ?  ? ?Mobility ? Bed Mobility ?Overal bed mobility: Needs Assistance ?Bed Mobility:  Supine to Sit ?  ?  ?Supine to sit: Supervision, HOB elevated ?  ?  ?General bed mobility comments:  (Scoots independently to edge of bed) ?  ? ?Transfers ?Overall transfer level: Needs assistance ?Equipment used: Rolling walker (2 wheels) ?Transfers: Sit to/from Stand ?Sit to Stand: Supervision ?  ?  ?  ?  ?  ?General transfer comment: safe hand placement ?  ? ?Ambulation/Gait ?Ambulation/Gait assistance: Min guard ?Gait Distance (Feet): 5 Feet ?Assistive device: Rolling walker (2 wheels) ?Gait Pattern/deviations: Step-to pattern, Decreased dorsiflexion - right, Antalgic ?  ?  ?Pre-gait activities:  (Static standing at RW, Marching in place, weight shifting) ?General Gait Details:  (R LE weakness noted with slight buckling, able to self correct.) ? ? ?Stairs ?  ?  ?  ?  ?  ? ? ?Wheelchair Mobility ?  ? ?Modified Rankin (Stroke Patients Only) ?  ? ? ?  ?Balance Overall balance assessment: Needs assistance ?Sitting-balance support: Feet supported ?Sitting balance-Leahy Scale: Good ?  ?  ?Standing balance support: Bilateral upper extremity supported, Reliant on assistive device for balance, Single extremity supported ?Standing balance-Leahy Scale: Good ?  ?  ?  ?  ?  ?  ?  ?  ?  ?  ?  ?  ?  ? ?  ?Cognition Arousal/Alertness: Awake/alert ?Behavior During Therapy: [facility] for tasks assessed/performed ?Overall Cognitive Status: Within Functional Limits for tasks assessed ?  ?  ?  ?  ?  ?  ?  ?  ?  ?  ?  ?  ?  ?  ?  ?  ?  General Comments:  (Pt very alert, answering questions appropriately.) ?  ?  ? ?  ?Exercises   ? ?  ?General Comments General comments (skin integrity, edema, etc.):  (Pt on 3L O294% at rest, decreasing to 90% with simple transfer bed to chair. Quick recovery back to mid 90's) ?  ?  ? ?Pertinent Vitals/Pain Pain Assessment ?Pain Assessment: No/denies pain  ? ? ?Home Living   ?  ?  ?  ?  ?  ?  ?  ?  ?  ?   ?  ?Prior Function    ?  ?  ?   ? ?PT Goals (current goals can now be found in the care plan section)  Acute Rehab PT Goals ?Patient Stated Goal: to go home ? ?  ?Frequency ? ? ? Min 2X/week ? ? ? ?  ?PT Plan    ? ? ?Co-evaluation   ?  ?  ?  ?  ? ?  ?AM-PAC PT "6 Clicks" Mobility   ?Outcome Measure ? Help needed turning from your back to your side while in a flat bed without using bedrails?: A Little ?Help needed moving from lying on your back to sitting on the side of a flat bed without using bedrails?: A Little ?Help needed moving to and from a bed to a chair (including a wheelchair)?: A Little ?Help needed standing up from a chair using your arms (e.g., wheelchair or bedside chair)?: A Little ?Help needed to walk in hospital room?: A Little ?Help needed climbing 3-5 steps with a railing? : A Lot ?6 Click Score: 17 ? ?  ?End of Session Equipment Utilized During Treatment: Gait belt;Oxygen ?Activity Tolerance: Patient tolerated treatment well ?Patient left: in chair;with call bell/phone within reach;with chair alarm set ?Nurse Communication: Mobility status ?PT Visit Diagnosis: Other abnormalities of gait and mobility (R26.89);Difficulty in walking, not elsewhere classified (R26.2);Muscle weakness (generalized) (M62.81);Pain ?  ? ? ?Time: 1020-1100 ?PT Time Calculation (min) (ACUTE ONLY): 40 min ? ?Charges:  $Gait Training: 8-22 mins ?$Therapeutic Exercise: 8-22 mins ?$Therapeutic Activity: 8-22 mins          ?          ?Zadie Cleverly, PTA ? ? ? ?Jannet Askew ?01/07/2022, 11:19 AM ? ?

## 2022-01-07 NOTE — Progress Notes (Signed)
Ecchymosis noted on bilateral arms.  ? ?Patient has been changed and cleansed.  ? ?CHG bath completed. ?

## 2022-01-07 NOTE — Care Management Important Message (Signed)
Important Message ? ?Patient Details  ?Name: Michael Padilla ?MRN: [id number] ?Date of Birth: 12/04/1955 ? ? ?Medicare Important Message Given:  Yes ? ? ? ? ?Dannette Barbara ?01/07/2022, 3:20 PM ?

## 2022-01-07 NOTE — Assessment & Plan Note (Signed)
Continue vitamin B12 supplement, follow with PCP to repeat vitamin B12 level after 3 months ?

## 2022-01-07 NOTE — Progress Notes (Signed)
?Progress Note ? ? ?Patient: Michael Padilla [id number] DOB: June 16, 1956 DOA: 01/02/2022     5 ?DOS: the patient was seen and examined on 01/07/2022 ?  ?Brief hospital course: ?Patient is a 66 year old white male with past medical history of alcohol abuse, tobacco abuse and COPD hospitalized a month ago for COVID and diarrhea and was reportedly doing fine when checked on by his son the previous day, but on 3/13 morning, patient was found to be unresponsive and then woke up and was confused and EMS was called.  In the emergency room, patient was to be encephalopathic with a CT scan of head unremarkable, but MRI noting small subacute infarct.  Lab work noteworthy for some hyponatremia with sodium of 124 although patient has history of chronic hyponatremia.  He was noted to have a leukocytosis 17, platelet count of 6.5 and lactic acid level of 2.5 although urine was only weakly positive for infection and CT scan noted no evidence of PE or pneumonia.  Procalcitonin also mildly elevated 0.33 and patient was started on IV Unasyn.  Neurology consulted.  EEG unremarkable for acute seizure but noted to have signs consistent with moderate diffuse encephalopathy and nonspecific.  Echocardiogram done was unremarkable.  Patient placed in stepdown unit oriented to self only with garbled speech.  When he sleeps at times, oxygen saturations drop although he overall is not hypoxic when he is awake. ? ?By 3/14, patient improved.  He has woken up on his own and is more interactive although still confused and confabulates.  Lab work notes improvement in his sodium. ? ?Assessment and Plan: ?* Acute metabolic encephalopathy ?Unclear etiology.  Could be due to CVA, hypoxia respiratory failure and EtOH abuse.  ?Patient is more awake and alert now, AO x3.  Encephalopathy resolved.  Patient is back to his baseline.  ? ? ? ?Acute CVA (cerebrovascular accident) (HCC) ?Small 6 mm acute to early subacute ischemic infarct in the left cerebellum.   Would not necessarily explain patient's encephalopathy episodes. ?Patient was seen by neurology, recommended aspirin and Plavix for 21 days followed by aspirin monotherapy, no need of statin due to LDL 33 below the target level.  ?SLP eval done, recommended dysphagia 2 nectar thick liquid diet  ?PT and OT eval done Rec HHPT  ?We will discontinue Foley when patient is more ambulatory ? ?Alcohol abuse ?He may be having some withdrawals, given tachycardia.  On CIWA protocol.  Unclear when his last drink was. ?Discontinued IV Ativan which was scheduled for past 3 days ?3/15 started Valium tapering dose over 3 days ? ? ?COPD with acute exacerbation (HCC) ?Patient has history of tobacco abuse.  Not on oxygen at home.  His oxygen saturations here are good unless he falls asleep and then his oxygen saturations drop, especially due to mouth breathing.  Trying high flow oxygen to see if this can keep his saturations up.  ABG is not impressive and he is not acidotic with no CO2 retention. ?Currently patient is on 3 L oxygen via nasal cannula, we will continue to wean down as per improvement ?Started Breo Ellipta inhaler, Incruse Ellipta inhaler and albuterol as needed ? ?Hyponatremia ? In part due to patient's alcohol intake as well as dehydration.  Much improved from 124 to 133--138 currently.  Patient more clinically awake ? ?Urinary tract infection ?Possible urinary tract infection, UA positive ?Urine culture was never sent ?Procalcitonin negative ?S/p Unasyn x 5 days and Augmentin x 2 days ? ? ? ?Essential hypertension ?3/15  started metoprolol 50 mg p.o. twice daily ?3/17 increase amlodipine from 5 to 10 mg p.o. daily due to persistent high blood pressure ?We will continue monitor BP and titrate medications accordingly ? ?Vitamin D deficiency ?Started vitamin D 50,000 units p.o. weekly, follow with PCP to repeat vitamin D level after 3 months ? ?Vitamin B12 deficiency ?Continue vitamin B12 supplement, follow with PCP to  repeat vitamin B12 level after 3 months ? ?Acute respiratory failure with hypoxia (HCC) ?it could be secondary to aspiration pneumonia ?S/p Unasyn x 5 days, switch to Augmentin for 2 more days to complete 7-day course.  WBC count back to within normal range ?Continue aspiration precautions ?SLP eval done, recommended dysphagia 2 nectar thick liquid diet ?Continue supplemental oxygenation and gradually wean off ?Currently patient is on 3 L oxygen via nasal cannula ? ? ? ? ? ?Nutrition Documentation   ? ?Flowsheet Row ED to Hosp-Admission (Current) from 01/02/2022 in [facility]  ?Nutrition Problem Severe Malnutrition  ?Etiology chronic illness  [COPD, ETOH and tobacco abuse]  ?Nutrition Goal Patient will meet greater than or equal to 90% of their needs  ?Interventions Refer to RD note for recommendations  ? ?  ? ? ?Subjective: No significant events overnight, patient is AO x3, still on oxygen 2 L via nasal cannula.  Patient blood pressure was high, medications adjusted. ?Inhalers started, hopefully we will see improvement in 1 to 2 days ? ? ?Physical Exam: ?Vitals:  ? 01/07/22 0454 01/07/22 0736 01/07/22 1205 01/07/22 1221  ?BP: (!) 149/97 (!) 146/94  108/86  ?Pulse: 97 84  98  ?Resp: 19 17  18   ?Temp: 98.1 ?F (36.7 ?C) 97.8 ?F (36.6 ?C)  97.7 ?F (36.5 ?C)  ?TempSrc: Oral   Oral  ?SpO2: 94% 94% 93% 94%  ?Weight:      ?Height:      ? ? ?General:  alert oriented to time, place, and person.  ?Appear in no distress, affect appropriate ?Eyes: PERRLA ?ENT: Oral Mucosa Clear, moist  ?Neck: no JVD,  ?Cardiovascular: S1 and S2 Present, no Murmur,  ?Respiratory: good respiratory effort, Bilateral Air entry equal and Decreased, b/l mild Crackles, no wheezes ?Abdomen: Bowel Sound present, Soft and no tenderness,  ?Skin: no rashes ?Extremities: no Pedal edema, no calf tenderness ?Neurologic: without any new focal findings ?Gait not checked due to patient safety concerns ? ? ?Data Reviewed: ?Reviewed  labs ? ? ? ?Family Communication: 3/15 discussed with patient's son at bedside ? ?Disposition: ?Status is: Inpatient ?Remains inpatient appropriate because: Still requires supplemental O2 inhalation, on tapering dose of Valium possible EtOH withdrawal ?SLP eval done dysphagia 2 diet with nectar thick liquids, PT/OT eval done recommended home health PT  ?3/17 still on 3 L oxygen via nasal cannula, we will continue to wean down before discharge, may need 1 to 2 days more ? ? Planned Discharge Destination: Home with Home Health PT in 1-2 days   ? ?Time spent: 35 minutes ? ?Author: ?4/17, MD ?01/07/2022 2:09 PM ? ?For on call review [email or web address].  ?

## 2022-01-08 DIAGNOSIS — E876 Hypokalemia: Secondary | ICD-10-CM

## 2022-01-08 LAB — GLUCOSE, CAPILLARY
Glucose-Capillary: 91 mg/dL (ref 70–99)
Glucose-Capillary: 95 mg/dL (ref 70–99)
Glucose-Capillary: 95 mg/dL (ref 70–99)
Glucose-Capillary: 115 mg/dL — ABNORMAL HIGH (ref 70–99)
Glucose-Capillary: 101 mg/dL — ABNORMAL HIGH (ref 70–99)

## 2022-01-08 LAB — BASIC METABOLIC PANEL
Anion gap: 9 (ref 5–15)
BUN: 15 mg/dL (ref 8–23)
CO2: 31 mmol/L (ref 22–32)
Calcium: 9 mg/dL (ref 8.9–10.3)
Chloride: 102 mmol/L (ref 98–111)
Creatinine, Ser: 0.52 mg/dL — ABNORMAL LOW (ref 0.61–1.24)
GFR, Estimated: >60 mL/min (ref >60)
Glucose, Bld: 99 mg/dL (ref 70–99)
Potassium: 3.2 mmol/L — ABNORMAL LOW (ref 3.5–5.1)
Sodium: 142 mmol/L (ref 135–145)

## 2022-01-08 LAB — CBC
HCT: 43.7 % (ref 39.0–52.0)
Hemoglobin: 14.1 g/dL (ref 13.0–17.0)
MCH: 32.6 pg (ref 26.0–34.0)
MCHC: 32.3 g/dL (ref 30.0–36.0)
MCV: 101.2 fL — ABNORMAL HIGH (ref 80.0–100.0)
Platelets: 732 10*3/uL — ABNORMAL HIGH (ref 150–400)
RBC: 4.32 MIL/uL (ref 4.22–5.81)
RDW: 13.8 % (ref 11.5–15.5)
WBC: 7.6 10*3/uL (ref 4.0–10.5)
nRBC: 0 % (ref 0.0–0.2)

## 2022-01-08 LAB — PHOSPHORUS: Phosphorus: 4.4 mg/dL (ref 2.5–4.6)

## 2022-01-08 LAB — MAGNESIUM: Magnesium: 2.4 mg/dL (ref 1.7–2.4)

## 2022-01-08 MED ORDER — POTASSIUM CHLORIDE CRYS ER 20 MEQ PO TBCR
40.0000 meq | EXTENDED_RELEASE_TABLET | Freq: Once | ORAL | Status: AC
  Administered 2022-01-08: 40 meq via ORAL
  Filled 2022-01-08: qty 2

## 2022-01-08 MED ORDER — POTASSIUM CHLORIDE CRYS ER 20 MEQ PO TBCR
40.0000 meq | EXTENDED_RELEASE_TABLET | Freq: Once | ORAL | Status: AC
Start: 2022-01-08 — End: 2022-01-08
  Administered 2022-01-08: 40 meq via ORAL
  Filled 2022-01-08: qty 2

## 2022-01-08 NOTE — Progress Notes (Signed)
CBG at  6 am is 91 ?

## 2022-01-08 NOTE — Progress Notes (Signed)
?Progress Note ? ? ?Patient: Michael Padilla [id number] DOB: Sep 13, 1956 DOA: 01/02/2022     6 ?DOS: the patient was seen and examined on 01/08/2022 ?  ?Brief hospital course: ?Patient is a 66 year old white male with past medical history of alcohol abuse, tobacco abuse and COPD hospitalized a month ago for COVID and diarrhea and was reportedly doing fine when checked on by his son the previous day, but on 3/13 morning, patient was found to be unresponsive and then woke up and was confused and EMS was called.  In the emergency room, patient was to be encephalopathic with a CT scan of head unremarkable, but MRI noting small subacute infarct.  Lab work noteworthy for some hyponatremia with sodium of 124 although patient has history of chronic hyponatremia.  He was noted to have a leukocytosis 17, platelet count of 6.5 and lactic acid level of 2.5 although urine was only weakly positive for infection and CT scan noted no evidence of PE or pneumonia.  Procalcitonin also mildly elevated 0.33 and patient was started on IV Unasyn.  Neurology consulted.  EEG unremarkable for acute seizure but noted to have signs consistent with moderate diffuse encephalopathy and nonspecific.  Echocardiogram done was unremarkable.  Patient placed in stepdown unit oriented to self only with garbled speech.  When he sleeps at times, oxygen saturations drop although he overall is not hypoxic when he is awake. ? ?By 3/14, patient improved.  He has woken up on his own and is more interactive although still confused and confabulates.  Lab work notes improvement in his sodium. ? ?Assessment and Plan: ?* Acute metabolic encephalopathy ?Unclear etiology.  Could be due to CVA, hypoxia respiratory failure and EtOH abuse.  ?Patient is more awake and alert now, AO x3.  Encephalopathy resolved.  Patient is back to his baseline.  ? ? ? ?Acute CVA (cerebrovascular accident) (HCC) ?Small 6 mm acute to early subacute ischemic infarct in the left cerebellum.   Would not necessarily explain patient's encephalopathy episodes. ?Patient was seen by neurology, recommended aspirin and Plavix for 21 days followed by aspirin monotherapy, no need of statin due to LDL 33 below the target level.  ?SLP eval done, recommended dysphagia 2 nectar thick liquid diet  ?PT and OT eval done Rec HHPT  ?We will discontinue Foley when patient is more ambulatory ? ?Alcohol abuse ?He may be having some withdrawals, given tachycardia.  On CIWA protocol.  Unclear when his last drink was. ?Discontinued IV Ativan which was scheduled for past 3 days ?3/15 started Valium tapering dose over 3 days ? ? ?COPD with acute exacerbation (HCC) ?Patient has history of tobacco abuse.  Not on oxygen at home.  His oxygen saturations here are good unless he falls asleep and then his oxygen saturations drop, especially due to mouth breathing.  Trying high flow oxygen to see if this can keep his saturations up.  ABG is not impressive and he is not acidotic with no CO2 retention. ?Currently patient is on 3 L oxygen via nasal cannula, we will continue to wean down as per improvement ?Started Breo Ellipta inhaler, Incruse Ellipta inhaler and albuterol as needed ? ?Hyponatremia ? In part due to patient's alcohol intake as well as dehydration.  Much improved from 124 to 133--138 currently.  Patient more clinically awake ? ?Urinary tract infection ?Possible urinary tract infection, UA positive ?Urine culture was never sent ?Procalcitonin negative ?S/p Unasyn x 5 days and Augmentin x 2 days ? ? ? ?Hypokalemia ?Potassium repleted  orally, monitor and replete as needed. ? ?Essential hypertension ?3/15 started metoprolol 50 mg p.o. twice daily ?3/17 increase amlodipine from 5 to 10 mg p.o. daily due to persistent high blood pressure ?We will continue monitor BP and titrate medications accordingly ? ?Vitamin D deficiency ?Started vitamin D 50,000 units p.o. weekly, follow with PCP to repeat vitamin D level after 3  months ? ?Vitamin B12 deficiency ?Continue vitamin B12 supplement, follow with PCP to repeat vitamin B12 level after 3 months ? ?Acute respiratory failure with hypoxia (HCC) ?it could be secondary to aspiration pneumonia ?S/p Unasyn x 5 days, switch to Augmentin for 2 more days to complete 7-day course.  WBC count back to within normal range ?Continue aspiration precautions ?SLP eval done, recommended dysphagia 2 nectar thick liquid diet ?Continue supplemental oxygenation and gradually wean off ?Currently patient is on 2 L oxygen via nasal cannula ?As per RN O2 saturation dropped to 86% on room air on exertion patient was placed back on 2 L oxygen via nasal cannula, we will reassess tomorrow, patient may need oxygen for home use ? ? ? ? ? ?Nutrition Documentation   ? ?Flowsheet Row ED to Hosp-Admission (Current) from 01/02/2022 in [facility]  ?Nutrition Problem Severe Malnutrition  ?Etiology chronic illness  [COPD, ETOH and tobacco abuse]  ?Nutrition Goal Patient will meet greater than or equal to 90% of their needs  ?Interventions Refer to RD note for recommendations  ? ?  ? ? ?Subjective: No significant events overnight, patient is AO x3, still on oxygen 2 L via nasal cannula.  Patient blood pressure was high, medications adjusted. ?Inhalers started, hopefully we will see improvement in 1 to 2 days ? ? ?Physical Exam: ?Vitals:  ? 01/08/22 0743 01/08/22 1149 01/08/22 1150 01/08/22 1156  ?BP: 135/73   (!) 163/98  ?Pulse: 90   88  ?Resp: 18   18  ?Temp: 97.7 ?F (36.5 ?C)   97.8 ?F (36.6 ?C)  ?TempSrc:      ?SpO2: 94% (!) 86% 93% 91%  ?Weight:      ?Height:      ? ? ?General:  alert oriented to time, place, and person.  ?Appear in no distress, affect appropriate ?Eyes: PERRLA ?ENT: Oral Mucosa Clear, moist  ?Neck: no JVD,  ?Cardiovascular: S1 and S2 Present, no Murmur,  ?Respiratory: good respiratory effort, Bilateral Air entry equal and Decreased, b/l mild Crackles, no wheezes ?Abdomen: Bowel  Sound present, Soft and no tenderness,  ?Skin: no rashes ?Extremities: no Pedal edema, no calf tenderness ?Neurologic: without any new focal findings ?Gait not checked due to patient safety concerns ? ? ?Data Reviewed: ?Reviewed labs ? ? ? ?Family Communication: 3/15 discussed with patient's son at bedside ? ?Disposition: ?Status is: Inpatient ?Remains inpatient appropriate because: Still requires supplemental O2 inhalation, s/p tapering dose of Valium possible EtOH withdrawal completed on 317 ?SLP eval done dysphagia 2 diet with nectar thick liquids, PT/OT eval done recommended home health PT  ?3/18 still on 2 L oxygen via nasal cannula, we will continue to wean down before discharge, may DC tomorrow a.m. ? ? Planned Discharge Destination: Home with Home Health PT in 1-2 days   ? ?Time spent: 35 minutes ? ?Author: ?Gillis Santa, MD ?01/08/2022 3:57 PM ? ?For on call review [email or web address]  ?

## 2022-01-08 NOTE — Progress Notes (Signed)
Ambulate patient on room air saturation drop to 86 % on exertions, patient placed back on oxygen 2L [place]   ?

## 2022-01-08 NOTE — Assessment & Plan Note (Signed)
Potassium repleted orally, monitor and replete as needed. ?

## 2022-01-08 NOTE — Progress Notes (Signed)
Patient is alert and oriented and vital signs have been stable this shift. ? ?Ecchymosis noted on bilateral arms and left elbow have been assessed and moisturizer applied.  ? ?Patient is currently on 3L of O2 ?

## 2022-01-09 LAB — BASIC METABOLIC PANEL
Anion gap: 9 (ref 5–15)
BUN: 14 mg/dL (ref 8–23)
CO2: 28 mmol/L (ref 22–32)
Calcium: 9.1 mg/dL (ref 8.9–10.3)
Chloride: 109 mmol/L (ref 98–111)
Creatinine, Ser: 0.41 mg/dL — ABNORMAL LOW (ref 0.61–1.24)
GFR, Estimated: >60 mL/min (ref >60)
Glucose, Bld: 102 mg/dL — ABNORMAL HIGH (ref 70–99)
Potassium: 4.1 mmol/L (ref 3.5–5.1)
Sodium: 146 mmol/L — ABNORMAL HIGH (ref 135–145)

## 2022-01-09 LAB — GLUCOSE, CAPILLARY
Glucose-Capillary: 83 mg/dL (ref 70–99)
Glucose-Capillary: 82 mg/dL (ref 70–99)
Glucose-Capillary: 100 mg/dL — ABNORMAL HIGH (ref 70–99)

## 2022-01-09 LAB — CBC
HCT: 45 % (ref 39.0–52.0)
Hemoglobin: 14.5 g/dL (ref 13.0–17.0)
MCH: 32.5 pg (ref 26.0–34.0)
MCHC: 32.2 g/dL (ref 30.0–36.0)
MCV: 100.9 fL — ABNORMAL HIGH (ref 80.0–100.0)
Platelets: 779 10*3/uL — ABNORMAL HIGH (ref 150–400)
RBC: 4.46 MIL/uL (ref 4.22–5.81)
RDW: 14 % (ref 11.5–15.5)
WBC: 8 10*3/uL (ref 4.0–10.5)
nRBC: 0 % (ref 0.0–0.2)

## 2022-01-09 MED ORDER — ADULT MULTIVITAMIN W/MINERALS CH: 1.0000 | ORAL_TABLET | Freq: Every day | ORAL | 2 refills | Status: AC

## 2022-01-09 MED ORDER — CLOPIDOGREL BISULFATE 75 MG PO TABS: 75.0000 mg | ORAL_TABLET | Freq: Every day | ORAL | 0 refills | Status: AC

## 2022-01-09 MED ORDER — IPRATROPIUM-ALBUTEROL 20-100 MCG/ACT IN AERS: 1.0000 | INHALATION_SPRAY | Freq: Four times a day (QID) | RESPIRATORY_TRACT | 0 refills | Status: DC | PRN

## 2022-01-09 MED ORDER — PANTOPRAZOLE SODIUM 40 MG PO TBEC
40.0000 mg | DELAYED_RELEASE_TABLET | Freq: Every day | ORAL | Status: DC
  Administered 2022-01-09: 40 mg via ORAL
  Filled 2022-01-09: qty 1

## 2022-01-09 MED ORDER — PANTOPRAZOLE SODIUM 40 MG PO TBEC: 40.0000 mg | DELAYED_RELEASE_TABLET | Freq: Every day | ORAL | 2 refills | Status: DC

## 2022-01-09 MED ORDER — FLUTICASONE-UMECLIDIN-VILANT 100-62.5-25 MCG/ACT IN AEPB: 1.0000 | INHALATION_SPRAY | Freq: Every day | RESPIRATORY_TRACT | 0 refills | Status: DC

## 2022-01-09 MED ORDER — ASPIRIN 81 MG PO CHEW: 81.0000 mg | CHEWABLE_TABLET | Freq: Every day | ORAL | 11 refills | Status: AC

## 2022-01-09 MED ORDER — PREDNISONE 50 MG PO TABS: 50.0000 mg | ORAL_TABLET | Freq: Every day | ORAL | 0 refills | Status: AC

## 2022-01-09 MED ORDER — METOPROLOL TARTRATE 50 MG PO TABS
50.0000 mg | ORAL_TABLET | Freq: Two times a day (BID) | ORAL | 0 refills | Status: DC
Start: 2022-01-09 — End: 2023-02-16

## 2022-01-09 MED ORDER — THIAMINE HCL 100 MG PO TABS: 100.0000 mg | ORAL_TABLET | Freq: Every day | ORAL | 0 refills | Status: AC

## 2022-01-09 MED ORDER — FOLIC ACID 1 MG PO TABS: 1.0000 mg | ORAL_TABLET | Freq: Every day | ORAL | 0 refills | Status: AC

## 2022-01-09 MED ORDER — CYANOCOBALAMIN 500 MCG PO TABS
500.0000 ug | ORAL_TABLET | Freq: Every day | ORAL | 0 refills | Status: AC
Start: 2022-01-10 — End: 2022-04-10

## 2022-01-09 MED ORDER — VITAMIN D (ERGOCALCIFEROL) 1.25 MG (50000 UNIT) PO CAPS: 50000.0000 [IU] | ORAL_CAPSULE | ORAL | 3 refills | Status: AC

## 2022-01-09 MED ORDER — PREDNISONE 50 MG PO TABS
50.0000 mg | ORAL_TABLET | Freq: Every day | ORAL | Status: DC
Start: 2022-01-09 — End: 2022-01-09
  Administered 2022-01-09: 50 mg via ORAL
  Filled 2022-01-09: qty 1

## 2022-01-09 MED ORDER — AMLODIPINE BESYLATE 10 MG PO TABS: 10.0000 mg | ORAL_TABLET | Freq: Every day | ORAL | 2 refills | Status: DC

## 2022-01-09 NOTE — Plan of Care (Signed)

## 2022-01-09 NOTE — Plan of Care (Signed)
?  Problem: Activity: ?Goal: Risk for activity intolerance will decrease ?01/09/2022 1337 by Jerene Dilling, RN ?Outcome: Adequate for Discharge ?01/09/2022 1337 by Jerene Dilling, RN ?Outcome: Adequate for Discharge ?  ?Problem: Nutrition: ?Goal: Adequate nutrition will be maintained ?01/09/2022 1337 by Jerene Dilling, RN ?Outcome: Adequate for Discharge ?01/09/2022 1337 by Jerene Dilling, RN ?Outcome: Adequate for Discharge ?  ?Problem: Coping: ?Goal: Level of anxiety will decrease ?01/09/2022 1337 by Jerene Dilling, RN ?Outcome: Adequate for Discharge ?01/09/2022 1337 by Jerene Dilling, RN ?Outcome: Adequate for Discharge ?  ?Problem: Elimination: ?Goal: Will not experience complications related to bowel motility ?01/09/2022 1337 by Jerene Dilling, RN ?Outcome: Adequate for Discharge ?01/09/2022 1337 by Jerene Dilling, RN ?Outcome: Adequate for Discharge ?Goal: Will not experience complications related to urinary retention ?01/09/2022 1337 by Jerene Dilling, RN ?Outcome: Adequate for Discharge ?01/09/2022 1337 by Jerene Dilling, RN ?Outcome: Adequate for Discharge ?  ?Problem: Education: ?Goal: Knowledge of General Education information will improve ?Description: Including pain rating scale, medication(s)/side effects and non-pharmacologic comfort measures ?01/09/2022 1337 by Jerene Dilling, RN ?Outcome: Adequate for Discharge ?01/09/2022 1337 by Jerene Dilling, RN ?Outcome: Adequate for Discharge ?  ?

## 2022-01-09 NOTE — Discharge Summary (Signed)
[facility] Discharge Summary ? ? ?Patient: Michael Padilla ZHG:[id number]  PCP: Patient, No Pcp Per (Inactive)  ?Date of admission: 01/02/2022   Date of discharge:  01/09/2022   ?  ?Discharge Diagnoses:  ?Principal Problem: ?  Acute metabolic encephalopathy ?Active Problems: ?  Acute CVA (cerebrovascular accident) (HCC) ?  Hyponatremia ?  COPD with acute exacerbation (HCC) ?  Alcohol abuse ?  Urinary tract infection ?  Protein-calorie malnutrition, severe ?  Acute respiratory failure with hypoxia (HCC) ?  Vitamin B12 deficiency ?  Vitamin D deficiency ?  Essential hypertension ?  Hypokalemia ? ? ?Admitted From: Home ?Disposition:  Home with HHPT ? ?Recommendations for Outpatient Follow-up:  ?PCP: in 1 week, monitor BP at home and follow with PCP to titrate medications accordingly ?Follow with pulmonologist in 1 to 2 weeks for PFTs and further management as an outpatient ?Follow up LABS/TEST:  CXR in 4 wks ? ? ?Diet recommendation: Cardiac diet ? ?Activity: The patient is advised to gradually reintroduce usual activities, as tolerated ? ?Discharge Condition: stable ? ?Code Status: Full code  ? ?History of present illness: As per the H and P dictated on admission ? ?Hospital Course:  ?Patient is a 66 year old white male with past medical history of alcohol abuse, tobacco abuse and COPD hospitalized a month ago for COVID and diarrhea and was reportedly doing fine when checked on by his son the previous day, but on 3/13 morning, patient was found to be unresponsive and then woke up and was confused and EMS was called.  In the emergency room, patient was to be encephalopathic with a CT scan of head unremarkable, but MRI noting small subacute infarct.  Lab work noteworthy for some hyponatremia with sodium of 124 although patient has history of chronic hyponatremia.  He was noted to have a leukocytosis 17, platelet count of 6.5 and lactic acid level of 2.5 although urine was only weakly positive for infection and CT  scan noted no evidence of PE or pneumonia.  Procalcitonin also mildly elevated 0.33 and patient was started on IV Unasyn.  Neurology consulted.  EEG unremarkable for acute seizure but noted to have signs consistent with moderate diffuse encephalopathy and nonspecific.  Echocardiogram done was unremarkable.  Patient placed in stepdown unit oriented to self only with garbled speech.  When he sleeps at times, oxygen saturations drop although he overall is not hypoxic when he is awake. ?By 3/14, patient improved.  He has woken up on his own and is more interactive although still confused and confabulates.  Lab work notes improvement in his sodium. ?Patient was admitted on 01/02/2022, I started taking care of of this patient on 01/05/2022 ?  ?Assessment and Plan: ?* Acute metabolic encephalopathy, Resolved ?Unclear etiology.  Could be due to CVA, hypoxia respiratory failure and EtOH abuse.  ?Patient is more awake and alert now, AO x3.  Encephalopathy resolved.  Patient is back to his baseline.  ?Acute CVA (cerebrovascular accident)  ?Small 6 mm acute to early subacute ischemic infarct in the left cerebellum.  Would not necessarily explain patient's encephalopathy episodes. ?Patient was seen by neurology, recommended aspirin and Plavix for 21 days followed by aspirin monotherapy, no need of statin due to LDL 33 below the target level.  ?SLP eval done, recommended dysphagia 2 nectar thick liquid diet, PT and OT eval done Rec HHPT.  ?Acute respiratory failure with hypoxia, it could be secondary to aspiration pneumonia ?S/p Unasyn x 5 days, switch to Augmentin for 2 more  days to complete 7-day course.  WBC count back to within normal range, Continue aspiration precautions. SLP eval done, recommended dysphagia 2 nectar thick liquid diet. Continue supplemental oxygenation Currently patient is on 2.5 L oxygen via nasal cannula.  Oxygen was arranged for home use. ?COPD with acute exacerbation,  Patient has history of tobacco  abuse.  Not on oxygen at home.  His oxygen saturations here are good unless he falls asleep and then his oxygen saturations drop, especially due to mouth breathing.  Trying high flow oxygen to see if this can keep his saturations up.  ABG is not impressive and he is not acidotic with no CO2 retention. Currently patient is on 2.5 L oxygen via nasal cannula, oxygen was arranged on discharge. Resumed home inhalers, patient was advised to follow-up with pulmonologist for PFTs and follow with PCP for further management.  Prednisone 50 mg p.o. daily for 5 days prescribed on discharge. ?Alcohol abuse, He may be having some withdrawals, given tachycardia. S/p CIWA protocol.  Unclear when his last drink was. Discontinued IV Ativan which was scheduled for past 3 days ?3/15 started Valium tapering dose over 3 days.  Currently stable, alcohol abstinence counseling done ?Hyponatremia, resolved, could be secondary to EtOH abuse and poor oral intake. ?Hypernatremia sodium 146 slightly elevated could be due to dehydration, advised to continue oral hydration ?Urinary tract infection, Possible urinary tract infection, UA positive, Urine culture was never sent. Procalcitonin negative. S/p Unasyn x 5 days and Augmentin x 2 days ?Hypokalemia, Resolved, Potassium repleted orally,  ?Essential hypertension ?3/15 started metoprolol 50 mg p.o. twice daily ?3/17 increase amlodipine from 5 to 10 mg p.o. daily due to persistent high blood pressure ?monitor BP at home and f/u with PCP to titrate medications accordingly ?Vitamin D deficiency ?Started vitamin D 50,000 units p.o. weekly, follow with PCP to repeat vitamin D level after 3 months ?Vitamin B12 deficiency ?Continue vitamin B12 supplement, follow with PCP to repeat vitamin B12 level after 3 months ?  ?Body mass index is 18.77 kg/m?Marland Kitchen.  ?Nutrition Problem: Severe Malnutrition ?Etiology: chronic illness (COPD, ETOH and tobacco abuse) ?Nutrition Interventions: ?Interventions: Refer to RD note  for recommendations ?   ? ?Patient was seen by physical therapy, who recommended Home health, which was arranged. ?On the day of the discharge the patient's vitals were stable, and no other acute medical condition were reported by patient. the patient was felt safe to be discharge at Home with Home health. ? ?Consultants: Neuro ?Procedures: None ? ?Discharge Exam: ?General: Appear in no distress, no Rash; Oral Mucosa Clear, moist. ?Cardiovascular: S1 and S2 Present, no Murmur, ?Respiratory: normal respiratory effort, Bilateral Air entry present and no Crackles, no wheezes ?Abdomen: Bowel Sound present, Soft and no tenderness, no hernia ?Extremities: no Pedal edema, no calf tenderness ?Neurology: alert and oriented to time, place, and person ?affect appropriate. ? ?Filed Weights  ? 01/02/22 1500 01/02/22 1748  ?Weight: 63.5 kg 56 kg  ? ?Vitals:  ? 01/09/22 0337 01/09/22 0757  ?BP: 120/75 (!) 132/94  ?Pulse: 86 89  ?Resp: 18 18  ?Temp: 98 ?F (36.7 ?C) 97.6 ?F (36.4 ?C)  ?SpO2: 93% 93%  ? ? ?DISCHARGE MEDICATION: ?Allergies as of 01/09/2022   ?No Known Allergies ?  ? ?  ?Medication List  ?  ? ?TAKE these medications   ? ?amLODipine 10 MG tablet ?Commonly known as: NORVASC ?Take 1 tablet (10 mg total) by mouth daily. ?Start taking on: January 10, 2022 ?  ?aspirin 81 MG  chewable tablet ?Chew 1 tablet (81 mg total) by mouth daily. ?Start taking on: January 10, 2022 ?  ?clopidogrel 75 MG tablet ?Commonly known as: PLAVIX ?Take 1 tablet (75 mg total) by mouth daily for 16 days. ?Start taking on: January 10, 2022 ?  ?Fluticasone-Umeclidin-Vilant 100-62.5-25 MCG/ACT Aepb ?Commonly known as: Trelegy Ellipta ?Inhale 1 puff into the lungs daily. ?What changed: medication strength ?  ?folic acid 1 MG tablet ?Commonly known as: FOLVITE ?Take 1 tablet (1 mg total) by mouth daily. ?  ?Ipratropium-Albuterol 20-100 MCG/ACT Aers respimat ?Commonly known as: COMBIVENT ?Inhale 1 puff into the lungs every 6 (six) hours as needed for wheezing. ?   ?metoprolol tartrate 50 MG tablet ?Commonly known as: LOPRESSOR ?Take 1 tablet (50 mg total) by mouth 2 (two) times daily. ?  ?multivitamin with minerals Tabs tablet ?Take 1 tablet by mouth daily. ?Start

## 2022-01-22 DIAGNOSIS — U071 COVID-19: Secondary | ICD-10-CM | POA: Diagnosis not present

## 2022-01-22 DIAGNOSIS — J9601 Acute respiratory failure with hypoxia: Secondary | ICD-10-CM | POA: Diagnosis not present

## 2022-01-24 DIAGNOSIS — J9601 Acute respiratory failure with hypoxia: Secondary | ICD-10-CM | POA: Diagnosis not present

## 2022-01-24 DIAGNOSIS — U071 COVID-19: Secondary | ICD-10-CM | POA: Diagnosis not present

## 2022-02-21 DIAGNOSIS — J9601 Acute respiratory failure with hypoxia: Secondary | ICD-10-CM | POA: Diagnosis not present

## 2022-02-21 DIAGNOSIS — U071 COVID-19: Secondary | ICD-10-CM | POA: Diagnosis not present

## 2022-02-23 DIAGNOSIS — U071 COVID-19: Secondary | ICD-10-CM | POA: Diagnosis not present

## 2022-02-23 DIAGNOSIS — J9601 Acute respiratory failure with hypoxia: Secondary | ICD-10-CM | POA: Diagnosis not present

## 2022-03-24 DIAGNOSIS — J9601 Acute respiratory failure with hypoxia: Secondary | ICD-10-CM | POA: Diagnosis not present

## 2022-03-24 DIAGNOSIS — U071 COVID-19: Secondary | ICD-10-CM | POA: Diagnosis not present

## 2022-03-26 DIAGNOSIS — J9601 Acute respiratory failure with hypoxia: Secondary | ICD-10-CM | POA: Diagnosis not present

## 2022-03-26 DIAGNOSIS — U071 COVID-19: Secondary | ICD-10-CM | POA: Diagnosis not present

## 2022-04-23 DIAGNOSIS — U071 COVID-19: Secondary | ICD-10-CM | POA: Diagnosis not present

## 2022-04-23 DIAGNOSIS — J9601 Acute respiratory failure with hypoxia: Secondary | ICD-10-CM | POA: Diagnosis not present

## 2022-04-25 DIAGNOSIS — J9601 Acute respiratory failure with hypoxia: Secondary | ICD-10-CM | POA: Diagnosis not present

## 2022-04-25 DIAGNOSIS — U071 COVID-19: Secondary | ICD-10-CM | POA: Diagnosis not present

## 2022-05-24 DIAGNOSIS — U071 COVID-19: Secondary | ICD-10-CM | POA: Diagnosis not present

## 2022-05-24 DIAGNOSIS — J9601 Acute respiratory failure with hypoxia: Secondary | ICD-10-CM | POA: Diagnosis not present

## 2022-05-26 DIAGNOSIS — J9601 Acute respiratory failure with hypoxia: Secondary | ICD-10-CM | POA: Diagnosis not present

## 2022-05-26 DIAGNOSIS — U071 COVID-19: Secondary | ICD-10-CM | POA: Diagnosis not present

## 2022-06-24 DIAGNOSIS — U071 COVID-19: Secondary | ICD-10-CM | POA: Diagnosis not present

## 2022-06-24 DIAGNOSIS — J9601 Acute respiratory failure with hypoxia: Secondary | ICD-10-CM | POA: Diagnosis not present

## 2022-06-26 DIAGNOSIS — J9601 Acute respiratory failure with hypoxia: Secondary | ICD-10-CM | POA: Diagnosis not present

## 2022-06-26 DIAGNOSIS — U071 COVID-19: Secondary | ICD-10-CM | POA: Diagnosis not present

## 2022-07-24 DIAGNOSIS — U071 COVID-19: Secondary | ICD-10-CM | POA: Diagnosis not present

## 2022-07-24 DIAGNOSIS — J9601 Acute respiratory failure with hypoxia: Secondary | ICD-10-CM | POA: Diagnosis not present

## 2022-07-26 DIAGNOSIS — U071 COVID-19: Secondary | ICD-10-CM | POA: Diagnosis not present

## 2022-07-26 DIAGNOSIS — J9601 Acute respiratory failure with hypoxia: Secondary | ICD-10-CM | POA: Diagnosis not present

## 2022-08-24 DIAGNOSIS — U071 COVID-19: Secondary | ICD-10-CM | POA: Diagnosis not present

## 2022-08-24 DIAGNOSIS — J9601 Acute respiratory failure with hypoxia: Secondary | ICD-10-CM | POA: Diagnosis not present

## 2022-08-26 DIAGNOSIS — J9601 Acute respiratory failure with hypoxia: Secondary | ICD-10-CM | POA: Diagnosis not present

## 2022-08-26 DIAGNOSIS — U071 COVID-19: Secondary | ICD-10-CM | POA: Diagnosis not present

## 2022-09-25 DIAGNOSIS — U071 COVID-19: Secondary | ICD-10-CM | POA: Diagnosis not present

## 2022-09-25 DIAGNOSIS — J9601 Acute respiratory failure with hypoxia: Secondary | ICD-10-CM | POA: Diagnosis not present

## 2022-10-26 DIAGNOSIS — J9601 Acute respiratory failure with hypoxia: Secondary | ICD-10-CM | POA: Diagnosis not present

## 2022-10-26 DIAGNOSIS — U071 COVID-19: Secondary | ICD-10-CM | POA: Diagnosis not present

## 2022-11-26 DIAGNOSIS — U071 COVID-19: Secondary | ICD-10-CM | POA: Diagnosis not present

## 2022-11-26 DIAGNOSIS — J9601 Acute respiratory failure with hypoxia: Secondary | ICD-10-CM | POA: Diagnosis not present

## 2022-12-25 DIAGNOSIS — J9601 Acute respiratory failure with hypoxia: Secondary | ICD-10-CM | POA: Diagnosis not present

## 2022-12-25 DIAGNOSIS — U071 COVID-19: Secondary | ICD-10-CM | POA: Diagnosis not present

## 2023-01-25 DIAGNOSIS — U071 COVID-19: Secondary | ICD-10-CM | POA: Diagnosis not present

## 2023-01-25 DIAGNOSIS — J9601 Acute respiratory failure with hypoxia: Secondary | ICD-10-CM | POA: Diagnosis not present

## 2023-01-31 IMAGING — MR MR HEAD W/O CM
12 series · 43 of 48 positions shown · non-contrast
Comparison: Comparison made with prior CT and CTA from earlier the
same day.

CLINICAL DATA: Initial evaluation for acute neuro deficit, stroke
suspected.

EXAM:
MRI HEAD WITHOUT CONTRAST
TECHNIQUE: Multiplanar, multiecho pulse sequences of the brain and surrounding
structures were obtained without intravenous contrast.

[Series 5: ax dwi_tracew · axial · 3.0mm · 0.65mm/px · z∈[-52,+91]mm · 3 of 48 slices shown]
[im 1/48]
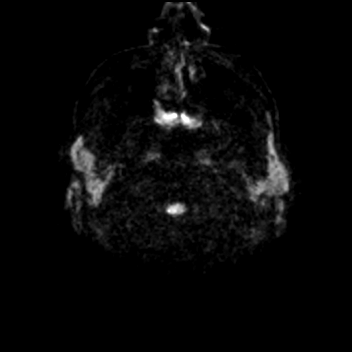
[im 24/48]
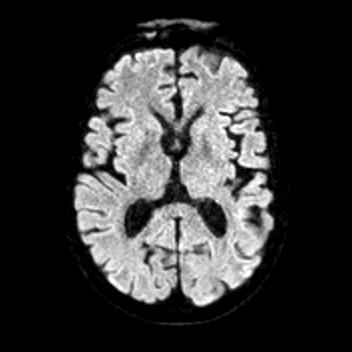
[im 48/48]
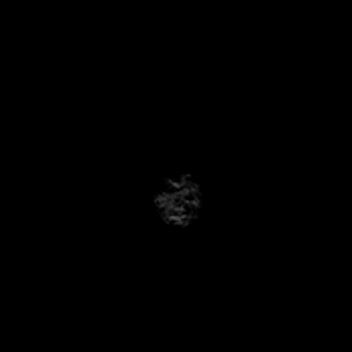

[Series 6: ax dwi_adc · axial · 3.0mm · 0.65mm/px · z∈[-52,+91]mm · 4 of 48 slices shown]
[im 1/48]
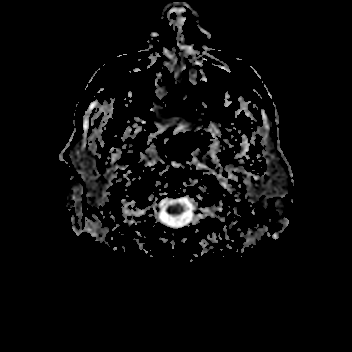
[im 16/48]
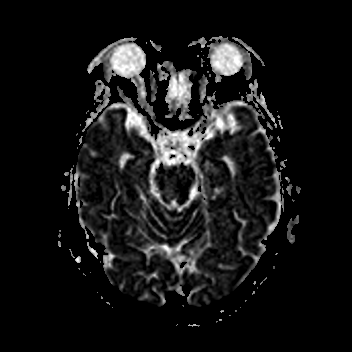
[im 32/48]
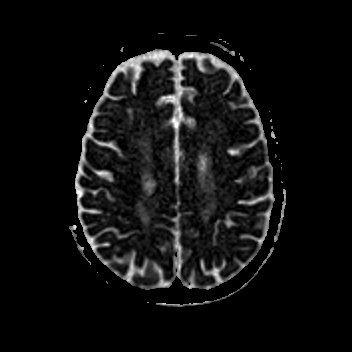
[im 48/48]
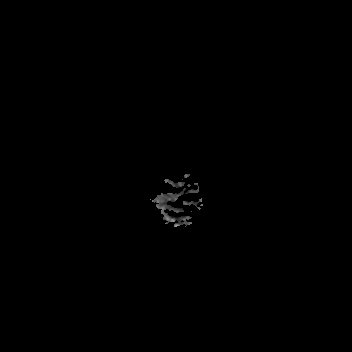

[Series 7: cor dwi_tracew · coronal · 5.0mm · 0.60mm/px · 3 of 36 slices shown]
[im 1/36]
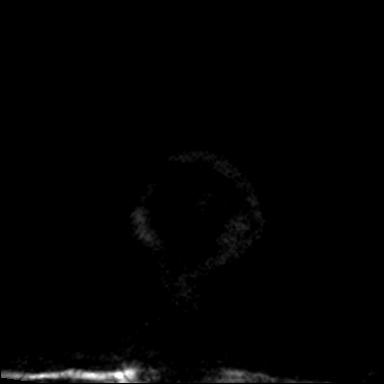
[im 18/36]
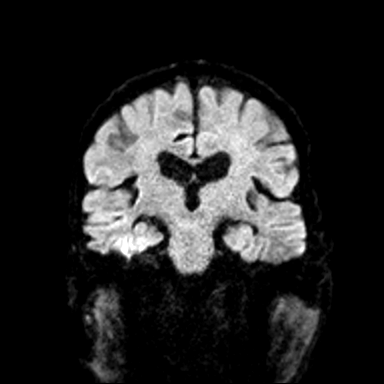
[im 36/36]
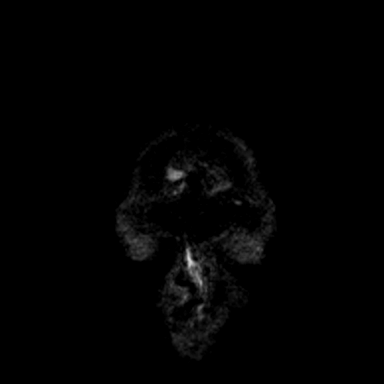

[Series 8: cor dwi_adc · coronal · 5.0mm · 0.60mm/px · 3 of 35 slices shown]
[im 1/35]
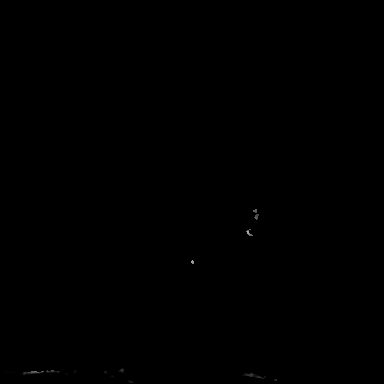
[im 18/35]
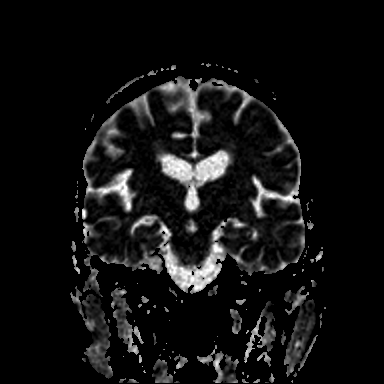
[im 35/35]
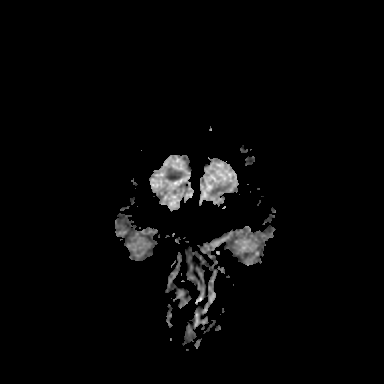

[Series 9: T1 · sagittal · 5.0mm · 0.62mm/px · 2 of 22 slices shown (1 of 2)]
[im 1/22]
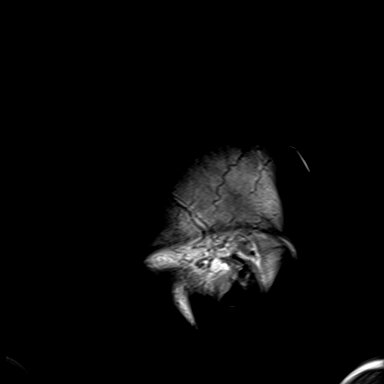
[im 22/22]
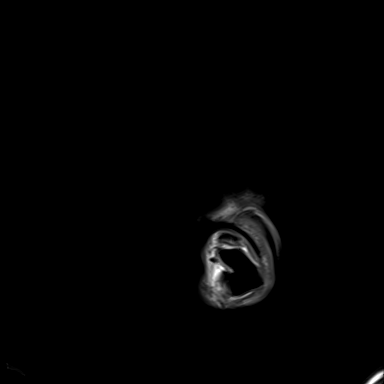

[Series 10: T2 · axial · 5.0mm · 0.53mm/px · z∈[-52,+86]mm · 2 of 26 slices shown (1 of 2)]
[im 1/26]
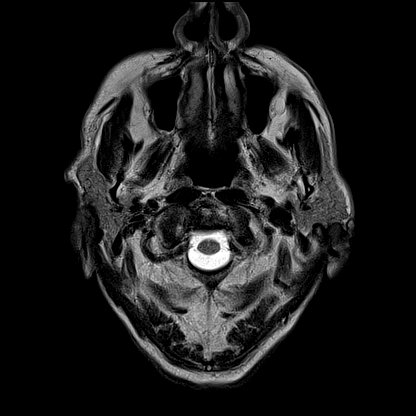
[im 26/26]
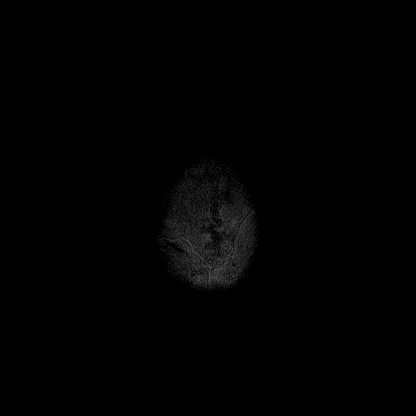

[Series 11: mag_images · axial · 3.0mm · 0.90mm/px · z∈[-62,+101]mm · 5 of 60 slices shown]
[im 1/60]
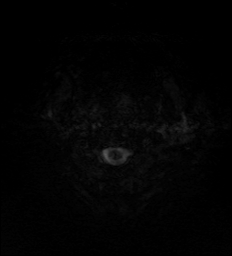
[im 15/60]
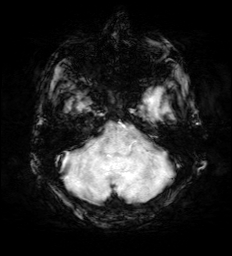
[im 30/60]
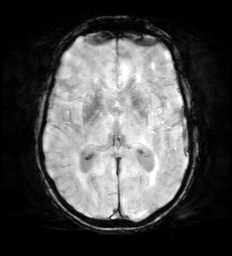
[im 45/60]
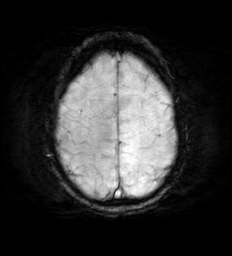
[im 60/60]
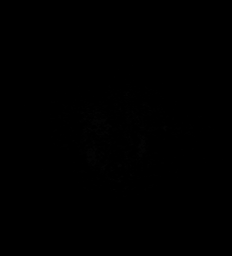

[Series 12: pha_images · axial · 3.0mm · 0.90mm/px · z∈[-62,+98]mm · 4 of 57 slices shown]
[im 1/57]
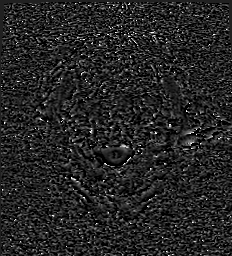
[im 19/57]
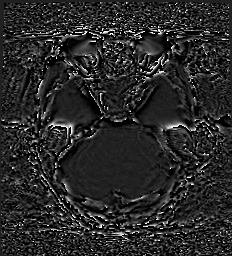
[im 38/57]
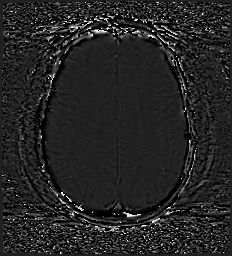
[im 57/57]
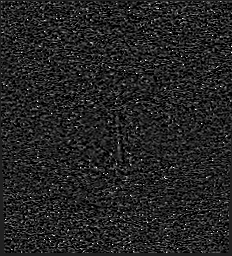

[Series 13: swi_images · axial · 3.0mm · 0.90mm/px · z∈[-62,+60]mm · 4 of 60 slices shown]
[im 1/60]
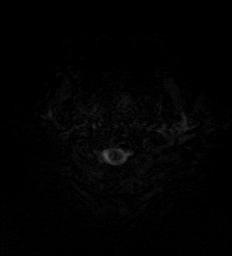
[im 15/60]
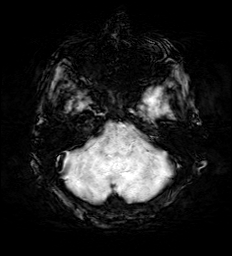
[im 30/60]
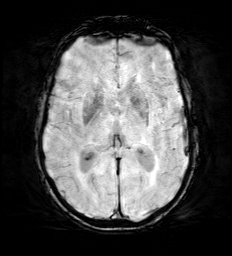
[im 45/60]
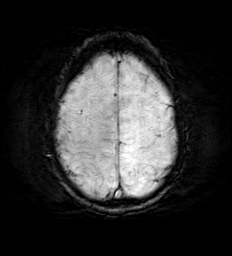

[Series 15: FLAIR · axial · 3.0mm · 0.53mm/px · z∈[-50,+85]mm · 3 of 42 slices shown]
[im 1/42]
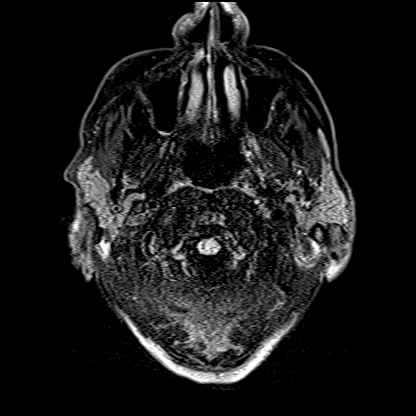
[im 21/42]
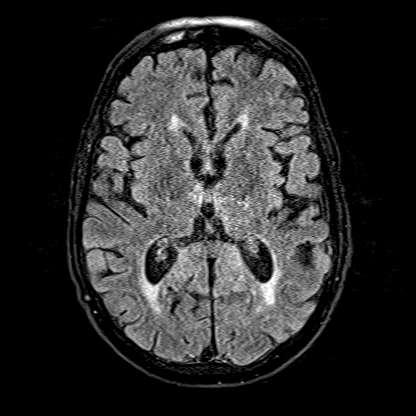
[im 42/42]
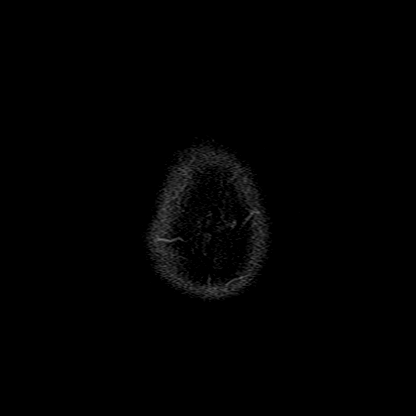

[Series 16: T1 · axial · 1.0mm · 0.98mm/px · z∈[-50,+96]mm · 8 of 160 slices shown (2 of 2)]
[im 1/160]
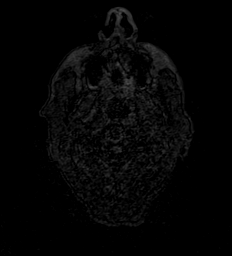
[im 29/160]
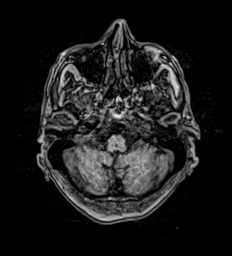
[im 44/160]
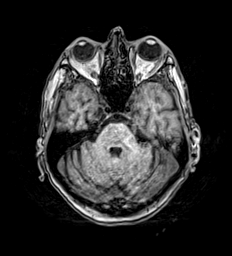
[im 73/160]
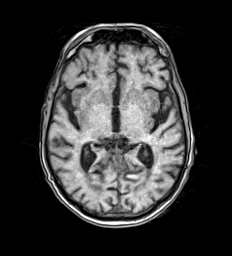
[im 87/160]
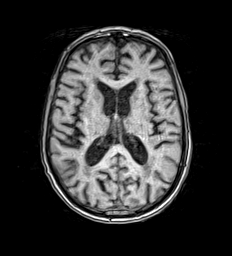
[im 116/160]
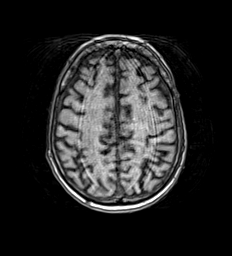
[im 131/160]
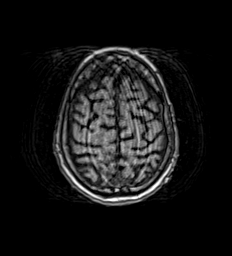
[im 160/160]
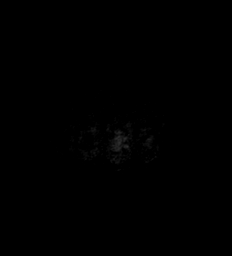

[Series 17: T2 · coronal · 5.0mm · 0.45mm/px · 2 of 31 slices shown (2 of 2)]
[im 1/31]
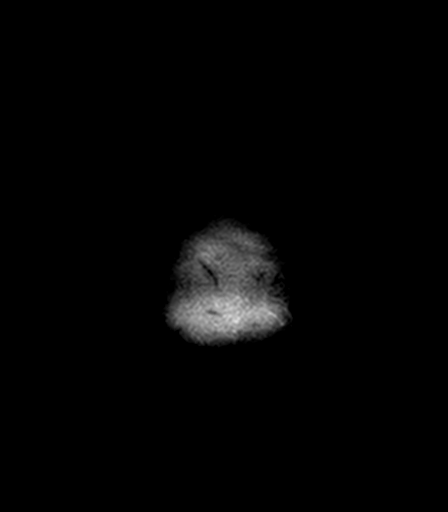
[im 31/31]
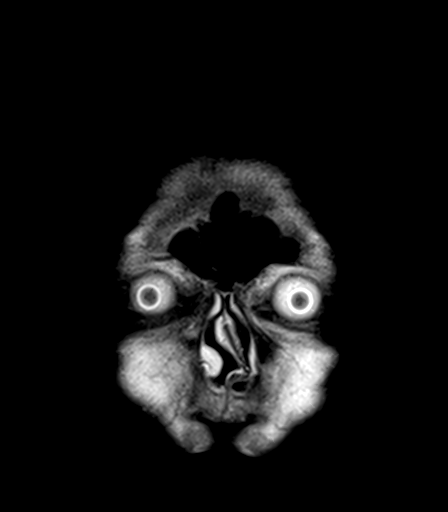

[43 of 48 positions shown; findings below may reference images not displayed]

FINDINGS: Brain: Generalized age-related cerebral atrophy. Patchy and
confluent T2/FLAIR hyperintensity involving the periventricular deep
white matter both cerebral hemispheres most consistent with chronic
small vessel ischemic disease, mild in nature.

Single subtle 6 mm focus of diffusion signal abnormality seen
involving the left cerebellum (series 5, image 9), likely a small
acute to early subacute ischemic infarct. No associated hemorrhage
or mass effect. No other evidence for acute or subacute ischemia.
Gray-white matter differentiation otherwise maintained. No areas of
remote cortical infarction seen elsewhere within the brain. No other
acute or chronic intracranial blood products.

No mass lesion, midline shift or mass effect. No hydrocephalus or
extra-axial fluid collection. Pituitary gland suprasellar region
within normal limits. Midline structures intact and normal.

Vascular: Major intracranial vascular flow voids are maintained.

Skull and upper cervical spine: Craniocervical junction within
normal limits. Bone marrow signal intensity normal. No scalp soft
tissue abnormality.

Sinuses/Orbits: Globes and orbital soft tissues within normal
limits. Mild scattered mucosal thickening noted within the ethmoidal
air cells. Paranasal sinuses are otherwise clear. Trace bilateral
mastoid effusions, of doubtful significance. Visualized nasopharynx
unremarkable.

Other: None.
IMPRESSION: 1. 6 mm focus of mild diffusion signal abnormality involving the
left cerebellum, likely a small acute to early subacute ischemic
nonhemorrhagic infarct.
2. No other acute intracranial abnormality.
3. Age-related cerebral atrophy with mild chronic small vessel
ischemic disease.

## 2023-02-04 IMAGING — DX DG CHEST 1V PORT
1 series · 1 of 1 positions shown · non-contrast
Comparison: 01/02/2022, 11/20/2020

CLINICAL DATA: 66-year-old male with shortness of breath.

EXAM:
PORTABLE CHEST - 1 VIEW

[chest ap]
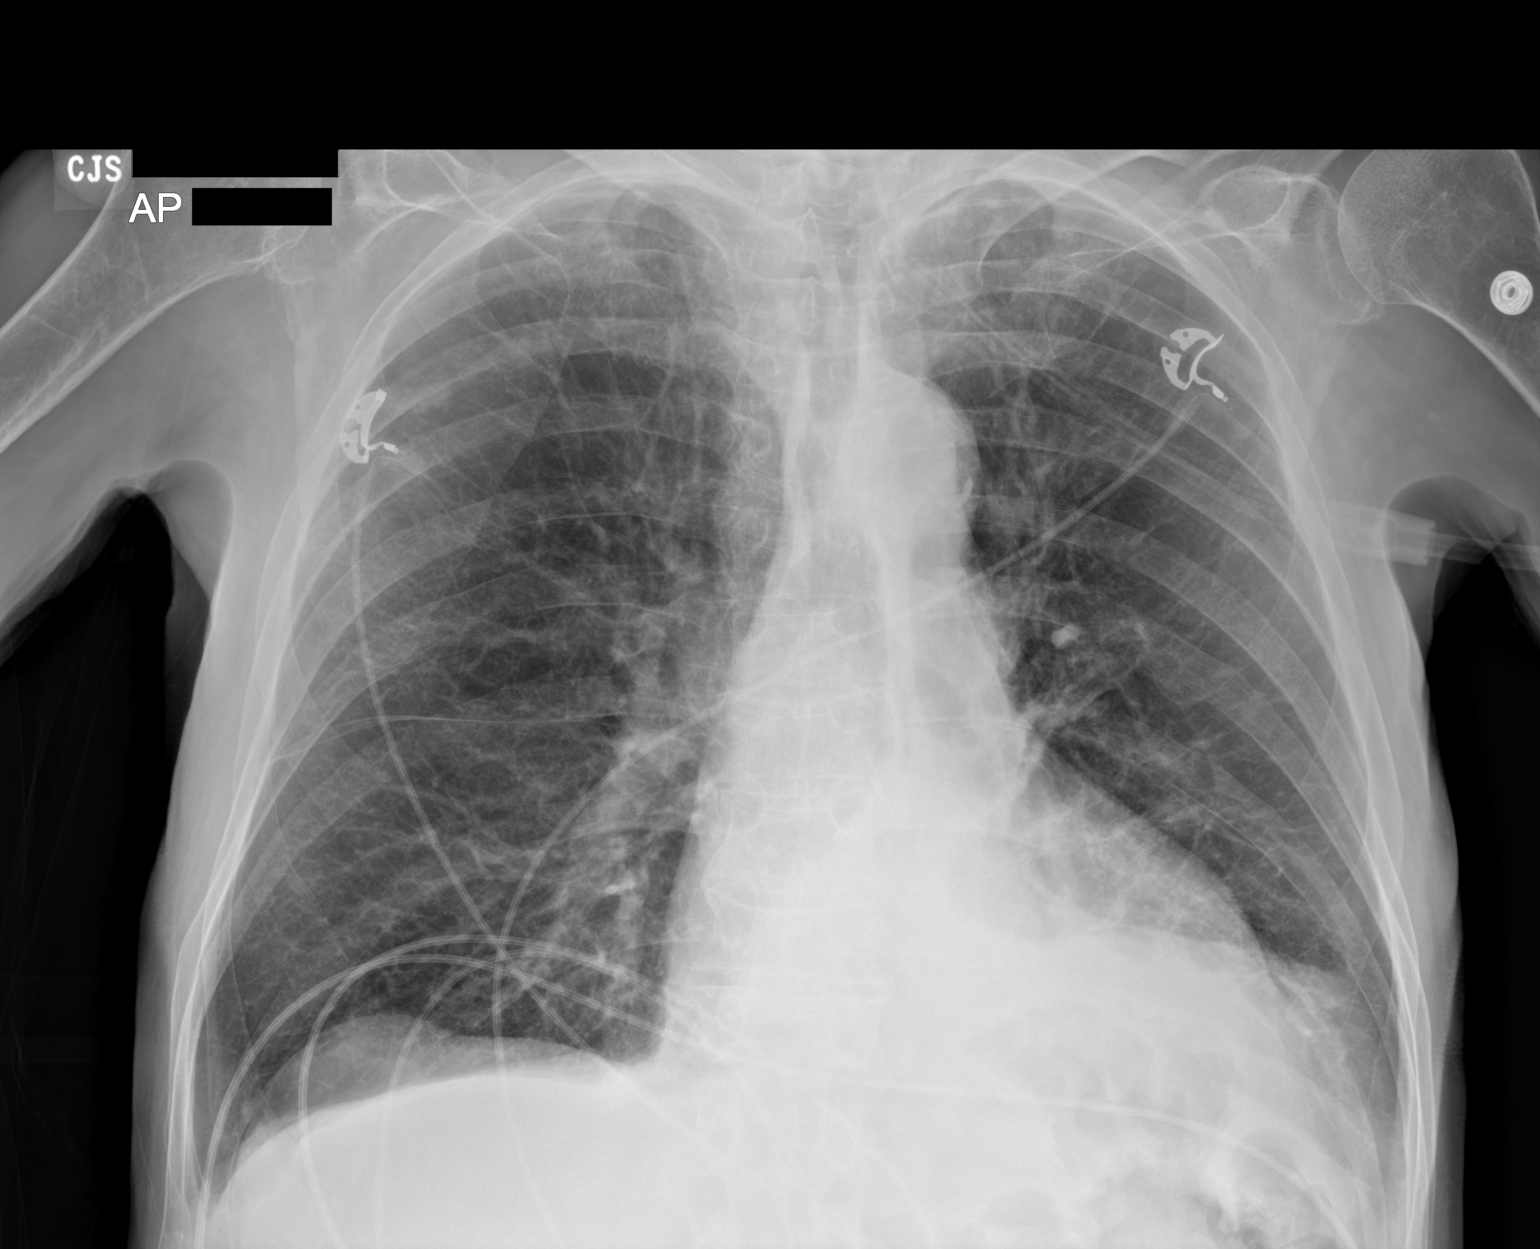

[1 of 1 positions shown; findings below may reference images not displayed]

FINDINGS: The mediastinal contours are within normal limits. No cardiomegaly.
Atherosclerotic calcification of the aortic arch. Similar appearing
mild elevation of the left hemidiaphragm. Similar appearing
bilateral hyperinflation. The lungs are clear bilaterally without
evidence of focal consolidation, pleural effusion, or pneumothorax.
No acute osseous abnormality.
IMPRESSION: 1. No acute cardiopulmonary process.
2. Similar appearing chronic elevation left hemidiaphragm.
3. Similar appearing changes of emphysema (S5SYI-8JD.Y).
4.  Aortic Atherosclerosis (S5SYI-USE.E).

## 2023-02-15 ENCOUNTER — Emergency Department: Payer: Medicare HMO

## 2023-02-15 ENCOUNTER — Inpatient Hospital Stay: Payer: Medicare HMO

## 2023-02-15 ENCOUNTER — Observation Stay
Admission: EM | Admit: 2023-02-15 | Discharge: 2023-02-16 | Disposition: A | Discharge status: Home / Self Care | Payer: Medicare HMO | Attending: Obstetrics and Gynecology | Admitting: Obstetrics and Gynecology

## 2023-02-15 DIAGNOSIS — R0602 Shortness of breath: Secondary | ICD-10-CM | POA: Diagnosis not present

## 2023-02-15 DIAGNOSIS — J449 Chronic obstructive pulmonary disease, unspecified: Secondary | ICD-10-CM | POA: Diagnosis present

## 2023-02-15 DIAGNOSIS — R4182 Altered mental status, unspecified: Principal | ICD-10-CM

## 2023-02-15 DIAGNOSIS — D75839 Thrombocytosis, unspecified: Secondary | ICD-10-CM | POA: Diagnosis not present

## 2023-02-15 DIAGNOSIS — I10 Essential (primary) hypertension: Secondary | ICD-10-CM | POA: Insufficient documentation

## 2023-02-15 DIAGNOSIS — Z79899 Other long term (current) drug therapy: Secondary | ICD-10-CM | POA: Insufficient documentation

## 2023-02-15 DIAGNOSIS — R55 Syncope and collapse: Secondary | ICD-10-CM | POA: Diagnosis not present

## 2023-02-15 DIAGNOSIS — R410 Disorientation, unspecified: Secondary | ICD-10-CM | POA: Diagnosis not present

## 2023-02-15 DIAGNOSIS — Z1152 Encounter for screening for COVID-19: Secondary | ICD-10-CM | POA: Insufficient documentation

## 2023-02-15 DIAGNOSIS — R404 Transient alteration of awareness: Secondary | ICD-10-CM

## 2023-02-15 DIAGNOSIS — Z743 Need for continuous supervision: Secondary | ICD-10-CM | POA: Diagnosis not present

## 2023-02-15 DIAGNOSIS — Z7951 Long term (current) use of inhaled steroids: Secondary | ICD-10-CM | POA: Diagnosis not present

## 2023-02-15 DIAGNOSIS — J9601 Acute respiratory failure with hypoxia: Secondary | ICD-10-CM

## 2023-02-15 DIAGNOSIS — D519 Vitamin B12 deficiency anemia, unspecified: Secondary | ICD-10-CM | POA: Insufficient documentation

## 2023-02-15 DIAGNOSIS — J441 Chronic obstructive pulmonary disease with (acute) exacerbation: Secondary | ICD-10-CM | POA: Diagnosis not present

## 2023-02-15 DIAGNOSIS — Z8673 Personal history of transient ischemic attack (TIA), and cerebral infarction without residual deficits: Secondary | ICD-10-CM | POA: Diagnosis not present

## 2023-02-15 DIAGNOSIS — I639 Cerebral infarction, unspecified: Secondary | ICD-10-CM

## 2023-02-15 DIAGNOSIS — J439 Emphysema, unspecified: Secondary | ICD-10-CM | POA: Diagnosis not present

## 2023-02-15 DIAGNOSIS — F17210 Nicotine dependence, cigarettes, uncomplicated: Secondary | ICD-10-CM | POA: Insufficient documentation

## 2023-02-15 DIAGNOSIS — F1010 Alcohol abuse, uncomplicated: Secondary | ICD-10-CM | POA: Diagnosis present

## 2023-02-15 DIAGNOSIS — R0902 Hypoxemia: Secondary | ICD-10-CM | POA: Diagnosis not present

## 2023-02-15 LAB — COMPREHENSIVE METABOLIC PANEL
ALT: 18 U/L (ref 0–44)
AST: 33 U/L (ref 15–41)
Albumin: 4 g/dL (ref 3.5–5.0)
Alkaline Phosphatase: 74 U/L (ref 38–126)
Anion gap: 9 (ref 5–15)
BUN: 8 mg/dL (ref 8–23)
CO2: 22 mmol/L (ref 22–32)
Calcium: 8.4 mg/dL — ABNORMAL LOW (ref 8.9–10.3)
Chloride: 95 mmol/L — ABNORMAL LOW (ref 98–111)
Creatinine, Ser: 0.51 mg/dL — ABNORMAL LOW (ref 0.61–1.24)
GFR, Estimated: >60 mL/min (ref >60)
Glucose, Bld: 181 mg/dL — ABNORMAL HIGH (ref 70–99)
Potassium: 3.9 mmol/L (ref 3.5–5.1)
Sodium: 126 mmol/L — ABNORMAL LOW (ref 135–145)
Total Bilirubin: 0.8 mg/dL (ref 0.3–1.2)
Total Protein: 7.1 g/dL (ref 6.5–8.1)

## 2023-02-15 LAB — BRAIN NATRIURETIC PEPTIDE: B Natriuretic Peptide: 68.1 pg/mL (ref 0.0–100.0)

## 2023-02-15 LAB — BLOOD GAS, VENOUS
Acid-base deficit: 1.3 mmol/L (ref 0.0–2.0)
Bicarbonate: 24.8 mmol/L (ref 20.0–28.0)
O2 Saturation: 94.5 %
Patient temperature: 37
pCO2, Ven: 46 mmHg (ref 44–60)
pH, Ven: 7.34 (ref 7.25–7.43)
pO2, Ven: 71 mmHg — ABNORMAL HIGH (ref 32–45)

## 2023-02-15 LAB — CBC WITH DIFFERENTIAL/PLATELET
Abs Immature Granulocytes: 0.04 10*3/uL (ref 0.00–0.07)
Basophils Absolute: 0.2 10*3/uL — ABNORMAL HIGH (ref 0.0–0.1)
Basophils Relative: 2 %
Eosinophils Absolute: 0.3 10*3/uL (ref 0.0–0.5)
Eosinophils Relative: 4 %
HCT: 45.6 % (ref 39.0–52.0)
Hemoglobin: 15.5 g/dL (ref 13.0–17.0)
Immature Granulocytes: 1 %
Lymphocytes Relative: 26 %
Lymphs Abs: 2.1 10*3/uL (ref 0.7–4.0)
MCH: 31.8 pg (ref 26.0–34.0)
MCHC: 34 g/dL (ref 30.0–36.0)
MCV: 93.6 fL (ref 80.0–100.0)
Monocytes Absolute: 1.1 10*3/uL — ABNORMAL HIGH (ref 0.1–1.0)
Monocytes Relative: 14 %
Neutro Abs: 4.2 10*3/uL (ref 1.7–7.7)
Neutrophils Relative %: 53 %
Platelets: 795 10*3/uL — ABNORMAL HIGH (ref 150–400)
RBC: 4.87 MIL/uL (ref 4.22–5.81)
RDW: 13 % (ref 11.5–15.5)
WBC: 7.9 10*3/uL (ref 4.0–10.5)
nRBC: 0 % (ref 0.0–0.2)

## 2023-02-15 LAB — CBC
HCT: 45.7 % (ref 39.0–52.0)
Hemoglobin: 15.5 g/dL (ref 13.0–17.0)
MCH: 31.9 pg (ref 26.0–34.0)
MCHC: 33.9 g/dL (ref 30.0–36.0)
MCV: 94 fL (ref 80.0–100.0)
Platelets: 719 10*3/uL — ABNORMAL HIGH (ref 150–400)
RBC: 4.86 MIL/uL (ref 4.22–5.81)
RDW: 13.2 % (ref 11.5–15.5)
WBC: 11.7 10*3/uL — ABNORMAL HIGH (ref 4.0–10.5)
nRBC: 0 % (ref 0.0–0.2)

## 2023-02-15 LAB — CREATININE, SERUM
Creatinine, Ser: 0.48 mg/dL — ABNORMAL LOW (ref 0.61–1.24)
GFR, Estimated: >60 mL/min (ref >60)

## 2023-02-15 LAB — TROPONIN I (HIGH SENSITIVITY)
Troponin I (High Sensitivity): 8 ng/L (ref <18)
Troponin I (High Sensitivity): 14 ng/L (ref <18)

## 2023-02-15 LAB — SARS CORONAVIRUS 2 BY RT PCR: SARS Coronavirus 2 by RT PCR: NEGATIVE

## 2023-02-15 MED ORDER — ALBUTEROL SULFATE (2.5 MG/3ML) 0.083% IN NEBU: 2.5000 mg | INHALATION_SOLUTION | RESPIRATORY_TRACT | Status: DC | PRN

## 2023-02-15 MED ORDER — IOHEXOL 350 MG/ML SOLN
75.0000 mL | Freq: Once | INTRAVENOUS | Status: AC | PRN
  Administered 2023-02-15: 75 mL via INTRAVENOUS

## 2023-02-15 MED ORDER — ADULT MULTIVITAMIN W/MINERALS CH
1.0000 | ORAL_TABLET | Freq: Every day | ORAL | Status: DC
  Administered 2023-02-16: 1 via ORAL
  Filled 2023-02-15: qty 1

## 2023-02-15 MED ORDER — LORAZEPAM 2 MG/ML IJ SOLN: 1.0000 mg | INTRAMUSCULAR | Status: DC | PRN

## 2023-02-15 MED ORDER — LORAZEPAM 1 MG PO TABS: 1.0000 mg | ORAL_TABLET | ORAL | Status: DC | PRN

## 2023-02-15 MED ORDER — THIAMINE HCL 100 MG/ML IJ SOLN: 100.0000 mg | Freq: Every day | INTRAMUSCULAR | Status: DC

## 2023-02-15 MED ORDER — ONDANSETRON HCL 4 MG/2ML IJ SOLN: 4.0000 mg | Freq: Four times a day (QID) | INTRAMUSCULAR | Status: DC | PRN

## 2023-02-15 MED ORDER — HEPARIN SODIUM (PORCINE) 5000 UNIT/ML IJ SOLN
5000.0000 [IU] | Freq: Three times a day (TID) | INTRAMUSCULAR | Status: DC
  Administered 2023-02-15 – 2023-02-16 (×3): 5000 [IU] via SUBCUTANEOUS
  Filled 2023-02-15 (×3): qty 1

## 2023-02-15 MED ORDER — FLUTICASONE FUROATE-VILANTEROL 100-25 MCG/ACT IN AEPB
1.0000 | INHALATION_SPRAY | Freq: Every day | RESPIRATORY_TRACT | Status: DC
  Filled 2023-02-15: qty 28

## 2023-02-15 MED ORDER — METHYLPREDNISOLONE SODIUM SUCC 40 MG IJ SOLR
40.0000 mg | Freq: Two times a day (BID) | INTRAMUSCULAR | Status: AC
  Administered 2023-02-15 – 2023-02-16 (×2): 40 mg via INTRAVENOUS
  Filled 2023-02-15 (×2): qty 1

## 2023-02-15 MED ORDER — IPRATROPIUM-ALBUTEROL 0.5-2.5 (3) MG/3ML IN SOLN
3.0000 mL | Freq: Four times a day (QID) | RESPIRATORY_TRACT | Status: DC
  Administered 2023-02-15 – 2023-02-16 (×4): 3 mL via RESPIRATORY_TRACT
  Filled 2023-02-15 (×4): qty 3

## 2023-02-15 MED ORDER — ONDANSETRON HCL 4 MG PO TABS: 4.0000 mg | ORAL_TABLET | Freq: Four times a day (QID) | ORAL | Status: DC | PRN

## 2023-02-15 MED ORDER — SODIUM CHLORIDE 0.9 % IV BOLUS
1000.0000 mL | Freq: Once | INTRAVENOUS | Status: AC
  Administered 2023-02-15: 1000 mL via INTRAVENOUS

## 2023-02-15 MED ORDER — DILTIAZEM HCL 25 MG/5ML IV SOLN
10.0000 mg | Freq: Once | INTRAVENOUS | Status: AC
  Administered 2023-02-15: 10 mg via INTRAVENOUS
  Filled 2023-02-15: qty 5

## 2023-02-15 MED ORDER — ACETAMINOPHEN 650 MG RE SUPP: 650.0000 mg | Freq: Four times a day (QID) | RECTAL | Status: DC | PRN

## 2023-02-15 MED ORDER — IPRATROPIUM-ALBUTEROL 0.5-2.5 (3) MG/3ML IN SOLN
3.0000 mL | Freq: Once | RESPIRATORY_TRACT | Status: AC
  Administered 2023-02-15: 3 mL via RESPIRATORY_TRACT
  Filled 2023-02-15: qty 3

## 2023-02-15 MED ORDER — INFUVITE ADULT IV SOLN
INTRAVENOUS | Status: DC
  Filled 2023-02-15: qty 10

## 2023-02-15 MED ORDER — DOXYCYCLINE HYCLATE 100 MG PO TABS
100.0000 mg | ORAL_TABLET | Freq: Two times a day (BID) | ORAL | Status: DC
  Administered 2023-02-15 – 2023-02-16 (×2): 100 mg via ORAL
  Filled 2023-02-15 (×2): qty 1

## 2023-02-15 MED ORDER — THIAMINE MONONITRATE 100 MG PO TABS
100.0000 mg | ORAL_TABLET | Freq: Every day | ORAL | Status: DC
  Administered 2023-02-16: 100 mg via ORAL
  Filled 2023-02-15: qty 1

## 2023-02-15 MED ORDER — FOLIC ACID 1 MG PO TABS
1.0000 mg | ORAL_TABLET | Freq: Every day | ORAL | Status: DC
  Administered 2023-02-16: 1 mg via ORAL
  Filled 2023-02-15: qty 1

## 2023-02-15 MED ORDER — PREDNISONE 20 MG PO TABS: 40.0000 mg | ORAL_TABLET | Freq: Every day | ORAL | Status: DC

## 2023-02-15 MED ORDER — ACETAMINOPHEN 325 MG PO TABS: 650.0000 mg | ORAL_TABLET | Freq: Four times a day (QID) | ORAL | Status: DC | PRN

## 2023-02-15 NOTE — H&P (Addendum)
History and Physical    Michael Padilla ZOX:[id number] DOB: [birth year] DOA: 02/15/2023  PCP: Pcp, No  Patient coming from: home  I have personally briefly reviewed patient's old medical records in [facility]  Chief Complaint: acute hypoxia with associated  altered mental status   HPI: Michael Padilla is a 67 y.o. male with medical history significant of  Etoh abuse , COPD on prn home O2 , arthritis, hx of CVA 12/2021 for which he was treated with DAPT and then de-escalated to ASA, essential hypertension, vit d def, B12 def who presents to ED BIB EMS after found down at home in lethargic state. On arrival ems noted patient was hypoxic  to 80's and was place don 12 L. Patient was also noted to be combative with pin point pupils. Per notes patient combative behavior resolved with resolution of hypoxemia.   ED Course: Afeb, BP   HR 103, rr 23 ,  bp 169/93,  HR 103, rr 16  sat 98% on 12 ,-92 % on 4L  Resp panel neg   EKG: junctional tachycardia , RBBB,  ? ST changes  Labs: Wbc 7.9, hgb 15.5, plt 795, CE8  NA 126 ( 146  1 year ago), K 3.9, cl 95, clu 181, cr 0.51,  BNP 68.1 Cxr: IMPRESSION: Hyperinflation with chronic changes.  IN ED   tx duoneb ,  1L , doxycycline, ns 1L , methyl pred 40 mg iv  Pre ED  2gram mag, albuterol, 125 mg solumedrol  Review of Systems: As per HPI otherwise 10 point review of systems negative.   Past Medical History:  Diagnosis Date   Arthritis    COPD (chronic obstructive pulmonary disease) (HCC)     Past Surgical History:  Procedure Laterality Date   none       reports that he has been smoking cigarettes. He has been smoking an average of 2 packs per day. He has never used smokeless tobacco. He reports current alcohol use of about 29.0 standard drinks of alcohol per week. He reports that he does not use drugs.  No Known Allergies  Family History  Problem Relation Age of Onset   Hypertension Mother     Prior to Admission medications    Medication Sig Start Date End Date Taking? Authorizing Provider  Fluticasone-Umeclidin-Vilant (TRELEGY ELLIPTA) 100-62.5-25 MCG/ACT AEPB Inhale 1 puff into the lungs daily. 01/09/22  Yes Gillis Santa, MD  Ipratropium-Albuterol (COMBIVENT) 20-100 MCG/ACT AERS respimat Inhale 1 puff into the lungs every 6 (six) hours as needed for wheezing. 01/09/22  Yes Gillis Santa, MD  Vitamin D, Ergocalciferol, (DRISDOL) 1.25 MG (50000 UNIT) CAPS capsule Take 50,000 Units by mouth every 7 (seven) days.   Yes [provider]  amLODipine (NORVASC) 10 MG tablet Take 1 tablet (10 mg total) by mouth daily. 01/10/22 04/10/22  Gillis Santa, MD  metoprolol tartrate (LOPRESSOR) 50 MG tablet Take 1 tablet (50 mg total) by mouth 2 (two) times daily. 01/09/22 04/09/22  Gillis Santa, MD  pantoprazole (PROTONIX) 40 MG tablet Take 1 tablet (40 mg total) by mouth daily. 01/10/22 04/10/22  Gillis Santa, MD    Physical Exam: Vitals:   02/15/23 1747 02/15/23 1800 02/15/23 1830 02/15/23 1900  BP:  (!) 169/93 (!) 155/96 (!) 160/91  Pulse: (!) 103 (!) 103 (!) 111 (!) 117  Resp: (!) 23 16 16 16   SpO2: 98% 98% 98% 91%    Constitutional: NAD, calm, comfortable Vitals:   02/15/23 1747 02/15/23 1800 02/15/23 1830  02/15/23 1900  BP:  (!) 169/93 (!) 155/96 (!) 160/91  Pulse: (!) 103 (!) 103 (!) 111 (!) 117  Resp: (!) SpO2: 98% 98% 98% 91%   Eyes: PERRL, lids and conjunctivae normal ENMT: Mucous membranes are moist. Posterior pharynx clear of any exudate or lesions.Normal dentition.  Neck: normal, supple, no masses, no thyromegaly Respiratory: clear to auscultation bilaterally, no wheezing, no crackles. Normal respiratory effort. No accessory muscle use.  Cardiovascular: Regular rate and rhythm, no murmurs / rubs / gallops. No extremity edema. 2+ pedal pulses. No carotid bruits.  Abdomen: no tenderness, no masses palpated. No hepatosplenomegaly. Bowel sounds positive.  Musculoskeletal: no clubbing /  cyanosis. No joint deformity upper and lower extremities. Good ROM, no contractures. Normal muscle tone.  Skin: no rashes, lesions, ulcers. No induration Neurologic: CN 2-12 grossly intact. Sensation intact, DTR normal. Strength 5/5 in all 4.  Psychiatric: Normal judgment and insight. Alert and oriented x 3. Normal mood.    Labs on Admission: I have personally reviewed following labs and imaging studies  CBC: Recent Labs  Lab 02/15/23 1751  WBC 7.9  NEUTROABS 4.2  HGB 15.5  HCT 45.6  MCV 93.6  PLT 795*   Basic Metabolic Panel: Recent Labs  Lab 02/15/23 1751  NA 126*  K 3.9  CL 95*  CO2 22  GLUCOSE 181*  BUN 8  CREATININE 0.51*  CALCIUM 8.4*   GFR: CrCl cannot be calculated (Unknown ideal weight.). Liver Function Tests: Recent Labs  Lab 02/15/23 1751  AST 33  ALT 18  ALKPHOS 74  BILITOT 0.8  PROT 7.1  ALBUMIN 4.0   No results for input(s): "LIPASE", "AMYLASE" in the last 168 hours. No results for input(s): "AMMONIA" in the last 168 hours. Coagulation Profile: No results for input(s): "INR", "PROTIME" in the last 168 hours. Cardiac Enzymes: No results for input(s): "CKTOTAL", "CKMB", "CKMBINDEX", "TROPONINI" in the last 168 hours. BNP (last 3 results) No results for input(s): "PROBNP" in the last 8760 hours. HbA1C: No results for input(s): "HGBA1C" in the last 72 hours. CBG: No results for input(s): "GLUCAP" in the last 168 hours. Lipid Profile: No results for input(s): "CHOL", "HDL", "LDLCALC", "TRIG", "CHOLHDL", "LDLDIRECT" in the last 72 hours. Thyroid Function Tests: No results for input(s): "TSH", "T4TOTAL", "FREET4", "T3FREE", "THYROIDAB" in the last 72 hours. Anemia Panel: No results for input(s): "VITAMINB12", "FOLATE", "FERRITIN", "TIBC", "IRON", "RETICCTPCT" in the last 72 hours. Urine analysis:    Component Value Date/Time   COLORURINE YELLOW (A) 01/03/2022 0318   APPEARANCEUR CLEAR (A) 01/03/2022 0318   LABSPEC >1.046 (H) 01/03/2022 0318    PHURINE 6.0 01/03/2022 0318   GLUCOSEU NEGATIVE 01/03/2022 0318   HGBUR SMALL (A) 01/03/2022 0318   BILIRUBINUR NEGATIVE 01/03/2022 0318   KETONESUR 20 (A) 01/03/2022 0318   PROTEINUR NEGATIVE 01/03/2022 0318   NITRITE POSITIVE (A) 01/03/2022 0318   LEUKOCYTESUR TRACE (A) 01/03/2022 0318    Radiological Exams on Admission: DG Chest Port 1 View  Result Date: 02/15/2023 CLINICAL DATA:  Shortness of breath EXAM: PORTABLE CHEST 1 VIEW COMPARISON:  X-ray 01/06/2022 and older FINDINGS: Calcified and tortuous aorta. Normal cardiopericardial silhouette. Hyperinflation with diffuse interstitial changes which are unchanged from previous and likely chronic. No consolidation, pneumothorax or effusion. No edema. Overlapping cardiac leads. Degenerative changes are seen along the spine. IMPRESSION: Hyperinflation with chronic changes. Electronically Signed   By: Karen Kays M.D.   On: 02/15/2023 18:07    EKG: Independently reviewed. Repeat pending  Assessment/Plan  Acute COPD exacerbation with acute hypoxic respiratory failure  -patient has been non complaint with inhaler for 3 mo -admit to progressive care  - solumedrol iv , bid taper po prednisone daily per protocol  - ctx/ azithromycin  -f/u on sputum cultures  -CT chest pending  -cxr : NAD - nebs standing and prn  -resume home inhaler -pulmonary toilet  -wean O2 as able   -vbg pending   hx of CVA  -12/2021 for which he was treated with DAPT and then de-escalated to ASA -continue with ASA 81 mg daily  Essential hypertension -borderline  -resume amlodipine -prn hydralazine   vit d def, B12 def -check levels  -resume supplement as needed    ETOH  -place on ciwa   DVT prophylaxis: heparin Code Status: .smt Family Communication:  Disposition Plan: patient  expected to be admitted greater than 2 midnights  Consults called: Pulmonary  Admission status: progressive   Lurline Del MD [facility]   If 7PM-7AM,  please contact night-coverage [email or web address] Password [email or web address]  02/15/2023, 8:00 PM

## 2023-02-15 NOTE — ED Provider Notes (Signed)
[facility] Provider Note   Event Date/Time   First MD Initiated Contact with Patient 02/15/23 1746     (approximate) History  Altered Mental Status  HPI Michael Padilla is a 67 y.o. male with a past medical history of tobacco abuse and COPD who presents via EMS after being found GCS 11 with hypoxia and combative on arrival.  Patient was reportedly foaming at the mouth and very altered prior to administering oxygen and after this patient's mental status began to improve.  Patient's initial lung exam showed poor air movement and patient was given 125 Solu-Medrol, 2 g magnesium,  ROS: Patient currently denies any vision changes, tinnitus, difficulty speaking, facial droop, sore throat, chest pain, shortness of breath, abdominal pain, nausea/vomiting/diarrhea, dysuria, or weakness/numbness/paresthesias in any extremity   Physical Exam  Triage Vital Signs: ED Triage Vitals  Enc Vitals Group     BP      Pulse      Resp      Temp      Temp src      SpO2      Weight      Height      Head Circumference      Peak Flow      Pain Score      Pain Loc      Pain Edu?      Excl. in GC?    Most recent vital signs: Vitals:   02/15/23 2130 02/15/23 2200  BP: 139/89 (!) 148/88  Pulse: (!) 120 (!) 126  Resp: (!) 25 (!) 24  SpO2: 99% 93%   General: Awake, cooperative CV:  Good peripheral perfusion.  Resp:  Increased effort, poor air movement over bilateral lung fields Abd:  No distention.  Other:  Elderly Caucasian male laying in bed in moderate respiratory distress ED Results / Procedures / Treatments  Labs (all labs ordered are listed, but only abnormal results are displayed) Labs Reviewed  COMPREHENSIVE METABOLIC PANEL - Abnormal; Notable for the following components:      Result Value   Sodium 126 (*)    Chloride 95 (*)    Glucose, Bld 181 (*)    Creatinine, Ser 0.51 (*)    Calcium 8.4 (*)    All other components within normal limits  CBC WITH  DIFFERENTIAL/PLATELET - Abnormal; Notable for the following components:   Platelets 795 (*)    Monocytes Absolute 1.1 (*)    Basophils Absolute 0.2 (*)    All other components within normal limits  CBC - Abnormal; Notable for the following components:   WBC 11.7 (*)    Platelets 719 (*)    All other components within normal limits  CREATININE, SERUM - Abnormal; Notable for the following components:   Creatinine, Ser 0.48 (*)    All other components within normal limits  BLOOD GAS, VENOUS - Abnormal; Notable for the following components:   pO2, Ven 71 (*)    All other components within normal limits  SARS CORONAVIRUS 2 BY RT PCR  RESPIRATORY PANEL BY PCR  EXPECTORATED SPUTUM ASSESSMENT W GRAM STAIN, RFLX TO RESP C  BRAIN NATRIURETIC PEPTIDE  HIV ANTIBODY (ROUTINE TESTING W REFLEX)  HEMOGLOBIN A1C  COMPREHENSIVE METABOLIC PANEL  CBC  LACTIC ACID, PLASMA  LACTIC ACID, PLASMA  MAGNESIUM  BASIC METABOLIC PANEL  TROPONIN I (HIGH SENSITIVITY)  TROPONIN I (HIGH SENSITIVITY)  TROPONIN I (HIGH SENSITIVITY)   EKG ED ECG REPORT I, Merwyn Katos, the attending  physician, personally viewed and interpreted this ECG. Date: 02/15/2023 EKG Time: 2149 Rate: 128 Rhythm: Tachycardic sinus rhythm QRS Axis: normal Intervals: normal ST/T Wave abnormalities: normal Narrative Interpretation: Tachycardic sinus rhythm.  No evidence of acute ischemia RADIOLOGY ED MD interpretation: Single view portable chest x-ray interpreted independently by me shows hyperinflation with chronic changes -Agree with radiology assessment Official radiology report(s): CT Angio Chest Pulmonary Embolism (PE) W or WO Contrast  Result Date: 02/15/2023 CLINICAL DATA:  Pulmonary embolism (PE) suspected, high prob EXAM: CT ANGIOGRAPHY CHEST WITH CONTRAST TECHNIQUE: Multidetector CT imaging of the chest was performed using the standard protocol during bolus administration of intravenous contrast. Multiplanar CT image  reconstructions and MIPs were obtained to evaluate the vascular anatomy. RADIATION DOSE REDUCTION: This exam was performed according to the departmental dose-optimization program which includes automated exposure control, adjustment of the mA and/or kV according to patient size and/or use of iterative reconstruction technique. CONTRAST:  75mL OMNIPAQUE IOHEXOL 350 MG/ML SOLN COMPARISON:  01/02/2022 FINDINGS: Cardiovascular: Heart is normal size. Aorta is normal caliber. No filling defects in the pulmonary arteries to suggest pulmonary emboli. Moderate aortic calcifications scattered coronary artery calcifications. Mediastinum/Nodes: No mediastinal, hilar, or axillary adenopathy. Trachea and esophagus are unremarkable. Thyroid unremarkable. Lungs/Pleura: Moderate centrilobular emphysema. No confluent opacities or effusions. Upper Abdomen: No acute findings Musculoskeletal: Chest wall soft tissues are unremarkable. Stable chronic compression fractures at T11 and T12. Progressive compression fracture involving the inferior endplate of L1. Review of the MIP images confirms the above findings. IMPRESSION: No evidence of pulmonary embolus. No acute cardiopulmonary disease. Scattered aortic atherosclerosis. Progressive L1 compression fracture. Stable compression fractures at T11 and T12. Aortic Atherosclerosis (ICD10-I70.0) and Emphysema (ICD10-J43.9). Electronically Signed   By: Charlett Nose M.D.   On: 02/15/2023 21:53   DG Chest Port 1 View  Result Date: 02/15/2023 CLINICAL DATA:  Shortness of breath EXAM: PORTABLE CHEST 1 VIEW COMPARISON:  X-ray 01/06/2022 and older FINDINGS: Calcified and tortuous aorta. Normal cardiopericardial silhouette. Hyperinflation with diffuse interstitial changes which are unchanged from previous and likely chronic. No consolidation, pneumothorax or effusion. No edema. Overlapping cardiac leads. Degenerative changes are seen along the spine. IMPRESSION: Hyperinflation with chronic changes.  Electronically Signed   By: Karen Kays M.D.   On: 02/15/2023 18:07   PROCEDURES: Critical Care performed: Yes, see critical care procedure note(s) Procedures MEDICATIONS ORDERED IN ED: Medications  doxycycline (VIBRA-TABS) tablet 100 mg (100 mg Oral Given 02/15/23 2044)  methylPREDNISolone sodium succinate (SOLU-MEDROL) 40 mg/mL injection 40 mg (40 mg Intravenous Given 02/15/23 2111)    Followed by  predniSONE (DELTASONE) tablet 40 mg (has no administration in time range)  fluticasone furoate-vilanterol (BREO ELLIPTA) 100-25 MCG/ACT 1 puff (has no administration in time range)  ipratropium-albuterol (DUONEB) 0.5-2.5 (3) MG/3ML nebulizer solution 3 mL (3 mLs Nebulization Given 02/15/23 2044)  albuterol (PROVENTIL) (2.5 MG/3ML) 0.083% nebulizer solution 2.5 mg (has no administration in time range)  acetaminophen (TYLENOL) tablet 650 mg (has no administration in time range)    Or  acetaminophen (TYLENOL) suppository 650 mg (has no administration in time range)  ondansetron (ZOFRAN) tablet 4 mg (has no administration in time range)    Or  ondansetron (ZOFRAN) injection 4 mg (has no administration in time range)  heparin injection 5,000 Units (5,000 Units Subcutaneous Given 02/15/23 2151)  LORazepam (ATIVAN) tablet 1-4 mg (has no administration in time range)    Or  LORazepam (ATIVAN) injection 1-4 mg (has no administration in time range)  thiamine (VITAMIN B1) tablet 100  mg (has no administration in time range)    Or  thiamine (VITAMIN B1) injection 100 mg (has no administration in time range)  folic acid (FOLVITE) tablet 1 mg (has no administration in time range)  multivitamin with minerals tablet 1 tablet (has no administration in time range)  M.V.I. Adult (INFUVITE ADULT) 10 mL in dextrose 5% lactated ringers 1,000 mL infusion (has no administration in time range)  diltiazem (CARDIZEM) injection 10 mg (has no administration in time range)  sodium chloride 0.9 % bolus 1,000 mL (has no  administration in time range)  ipratropium-albuterol (DUONEB) 0.5-2.5 (3) MG/3ML nebulizer solution 3 mL (3 mLs Nebulization Given 02/15/23 1825)  sodium chloride 0.9 % bolus 1,000 mL (0 mLs Intravenous Stopped 02/15/23 2044)  iohexol (OMNIPAQUE) 350 MG/ML injection 75 mL (75 mLs Intravenous Contrast Given 02/15/23 2136)   IMPRESSION / MDM / ASSESSMENT AND PLAN / ED COURSE  I reviewed the triage vital signs and the nursing notes.                             The patient is on the cardiac monitor to evaluate for evidence of arrhythmia and/or significant heart rate changes. Patient's presentation is most consistent with acute presentation with potential threat to life or bodily function. The patient appears to be suffering from a moderate/severe exacerbation of COPD.  Based on the history, exam, CXR/EKG reviewed by me, and further workup I dont suspect any other emergent cause of this presentation, such as pneumonia, acute coronary syndrome, congestive heart failure, pulmonary embolism, or pneumothorax.  ED Interventions: bronchodilators, steroids, antibiotics, reassess  Reassessment: After treatment, the patients shortness of breath is improving but patient is still requiring supplemental oxygenation   Disposition: Admit   FINAL CLINICAL IMPRESSION(S) / ED DIAGNOSES   Final diagnoses:  Altered mental status, unspecified altered mental status type  COPD exacerbation  Acute respiratory failure with hypoxia   Rx / DC Orders   ED Discharge Orders     None      Note:  This document was prepared using Dragon voice recognition software and may include unintentional dictation errors.   Merwyn Katos, MD 02/15/23 [phone]

## 2023-02-15 NOTE — ED Triage Notes (Signed)
Pt from home, turned grey and hypoxic upon EMS arrival, was combative, had pin point pupils on EMS arrival. Pt was satting in the 80s on EMS arrival and was placed on 12 L.  161/60  smoker 2 packs/day mg 2 g Ibuproprem 2.5 Albuterol 125 Solumedrol R AC 20 g CBG 143

## 2023-02-16 ENCOUNTER — Inpatient Hospital Stay: Payer: Medicare HMO

## 2023-02-16 ENCOUNTER — Other Ambulatory Visit: Payer: Self-pay

## 2023-02-16 ENCOUNTER — Encounter: Payer: Self-pay | Admitting: Internal Medicine

## 2023-02-16 DIAGNOSIS — R569 Unspecified convulsions: Secondary | ICD-10-CM | POA: Diagnosis not present

## 2023-02-16 DIAGNOSIS — D75839 Thrombocytosis, unspecified: Secondary | ICD-10-CM | POA: Insufficient documentation

## 2023-02-16 DIAGNOSIS — R55 Syncope and collapse: Secondary | ICD-10-CM | POA: Diagnosis not present

## 2023-02-16 DIAGNOSIS — Z8673 Personal history of transient ischemic attack (TIA), and cerebral infarction without residual deficits: Secondary | ICD-10-CM

## 2023-02-16 DIAGNOSIS — J441 Chronic obstructive pulmonary disease with (acute) exacerbation: Secondary | ICD-10-CM | POA: Diagnosis not present

## 2023-02-16 LAB — CBC
HCT: 41.9 % (ref 39.0–52.0)
Hemoglobin: 14.2 g/dL (ref 13.0–17.0)
MCH: 31.9 pg (ref 26.0–34.0)
MCHC: 33.9 g/dL (ref 30.0–36.0)
MCV: 94.2 fL (ref 80.0–100.0)
Platelets: 719 10*3/uL — ABNORMAL HIGH (ref 150–400)
RBC: 4.45 MIL/uL (ref 4.22–5.81)
RDW: 13.3 % (ref 11.5–15.5)
WBC: 7.8 10*3/uL (ref 4.0–10.5)
nRBC: 0 % (ref 0.0–0.2)

## 2023-02-16 LAB — URINE DRUG SCREEN, QUALITATIVE (ARMC ONLY)
Amphetamines, Ur Screen: NOT DETECTED
Barbiturates, Ur Screen: NOT DETECTED
Benzodiazepine, Ur Scrn: NOT DETECTED
Cannabinoid 50 Ng, Ur ~~LOC~~: NOT DETECTED
Cocaine Metabolite,Ur ~~LOC~~: NOT DETECTED
MDMA (Ecstasy)Ur Screen: NOT DETECTED
Methadone Scn, Ur: NOT DETECTED
Opiate, Ur Screen: NOT DETECTED
Phencyclidine (PCP) Ur S: NOT DETECTED
Tricyclic, Ur Screen: NOT DETECTED

## 2023-02-16 LAB — RETICULOCYTES
Immature Retic Fract: 12.7 % (ref 2.3–15.9)
RBC.: 4.42 MIL/uL (ref 4.22–5.81)
Retic Count, Absolute: 57 10*3/uL (ref 19.0–186.0)
Retic Ct Pct: 1.3 % (ref 0.4–3.1)

## 2023-02-16 LAB — CBC WITH DIFFERENTIAL/PLATELET
Abs Immature Granulocytes: 0.03 10*3/uL (ref 0.00–0.07)
Basophils Absolute: 0 10*3/uL (ref 0.0–0.1)
Basophils Relative: 0 %
Eosinophils Absolute: 0 10*3/uL (ref 0.0–0.5)
Eosinophils Relative: 0 %
HCT: 42.3 % (ref 39.0–52.0)
Hemoglobin: 14.1 g/dL (ref 13.0–17.0)
Immature Granulocytes: 0 %
Lymphocytes Relative: 5 %
Lymphs Abs: 0.3 10*3/uL — ABNORMAL LOW (ref 0.7–4.0)
MCH: 31.5 pg (ref 26.0–34.0)
MCHC: 33.3 g/dL (ref 30.0–36.0)
MCV: 94.4 fL (ref 80.0–100.0)
Monocytes Absolute: 0.1 10*3/uL (ref 0.1–1.0)
Monocytes Relative: 1 %
Neutro Abs: 7.1 10*3/uL (ref 1.7–7.7)
Neutrophils Relative %: 94 %
Platelets: 738 10*3/uL — ABNORMAL HIGH (ref 150–400)
RBC: 4.48 MIL/uL (ref 4.22–5.81)
RDW: 13.4 % (ref 11.5–15.5)
WBC: 7.6 10*3/uL (ref 4.0–10.5)
nRBC: 0 % (ref 0.0–0.2)

## 2023-02-16 LAB — COMPREHENSIVE METABOLIC PANEL
ALT: 16 U/L (ref 0–44)
AST: 32 U/L (ref 15–41)
Albumin: 3.7 g/dL (ref 3.5–5.0)
Alkaline Phosphatase: 64 U/L (ref 38–126)
Anion gap: 9 (ref 5–15)
BUN: 8 mg/dL (ref 8–23)
CO2: 22 mmol/L (ref 22–32)
Calcium: 8 mg/dL — ABNORMAL LOW (ref 8.9–10.3)
Chloride: 98 mmol/L (ref 98–111)
Creatinine, Ser: 0.5 mg/dL — ABNORMAL LOW (ref 0.61–1.24)
GFR, Estimated: >60 mL/min (ref >60)
Glucose, Bld: 198 mg/dL — ABNORMAL HIGH (ref 70–99)
Potassium: 3.8 mmol/L (ref 3.5–5.1)
Sodium: 129 mmol/L — ABNORMAL LOW (ref 135–145)
Total Bilirubin: 0.7 mg/dL (ref 0.3–1.2)
Total Protein: 6.6 g/dL (ref 6.5–8.1)

## 2023-02-16 LAB — RESPIRATORY PANEL BY PCR

## 2023-02-16 LAB — TSH: TSH: 0.51 u[IU]/mL (ref 0.350–4.500)

## 2023-02-16 LAB — SODIUM, URINE, RANDOM: Sodium, Ur: 30 mmol/L

## 2023-02-16 LAB — MAGNESIUM: Magnesium: 2.2 mg/dL (ref 1.7–2.4)

## 2023-02-16 LAB — IRON AND TIBC
Iron: 55 ug/dL (ref 45–182)
Saturation Ratios: 18 % (ref 17.9–39.5)
TIBC: 315 ug/dL (ref 250–450)
UIBC: 260 ug/dL

## 2023-02-16 LAB — TROPONIN I (HIGH SENSITIVITY): Troponin I (High Sensitivity): 12 ng/L (ref <18)

## 2023-02-16 LAB — OSMOLALITY, URINE: Osmolality, Ur: 529 mOsm/kg (ref 300–900)

## 2023-02-16 LAB — FOLATE: Folate: 18 ng/mL (ref >5.9)

## 2023-02-16 LAB — CBG MONITORING, ED: Glucose-Capillary: 180 mg/dL — ABNORMAL HIGH (ref 70–99)

## 2023-02-16 LAB — TECHNOLOGIST SMEAR REVIEW: Plt Morphology: NORMAL

## 2023-02-16 LAB — LACTIC ACID, PLASMA: Lactic Acid, Venous: 2.7 mmol/L (ref 0.5–1.9)

## 2023-02-16 LAB — OSMOLALITY: Osmolality: 278 mOsm/kg (ref 275–295)

## 2023-02-16 LAB — VITAMIN D 25 HYDROXY (VIT D DEFICIENCY, FRACTURES): Vit D, 25-Hydroxy: 71.94 ng/mL (ref 30–100)

## 2023-02-16 LAB — FERRITIN: Ferritin: 28 ng/mL (ref 24–336)

## 2023-02-16 LAB — CORTISOL: Cortisol, Plasma: 3.7 ug/dL

## 2023-02-16 LAB — VITAMIN B12: Vitamin B-12: 186 pg/mL (ref 180–914)

## 2023-02-16 LAB — HIV ANTIBODY (ROUTINE TESTING W REFLEX): HIV Screen 4th Generation wRfx: NONREACTIVE

## 2023-02-16 MED ORDER — DILTIAZEM HCL ER 60 MG PO CP12
60.0000 mg | ORAL_CAPSULE | Freq: Two times a day (BID) | ORAL | Status: DC
  Administered 2023-02-16 (×2): 60 mg via ORAL
  Filled 2023-02-16 (×2): qty 1

## 2023-02-16 MED ORDER — GADOBUTROL 1 MMOL/ML IV SOLN
5.0000 mL | Freq: Once | INTRAVENOUS | Status: AC | PRN
  Administered 2023-02-16: 5 mL via INTRAVENOUS

## 2023-02-16 MED ORDER — ASPIRIN 81 MG PO TBEC
81.0000 mg | DELAYED_RELEASE_TABLET | Freq: Every day | ORAL | Status: DC
  Administered 2023-02-16: 81 mg via ORAL
  Filled 2023-02-16: qty 1

## 2023-02-16 MED ORDER — FLUTICASONE-UMECLIDIN-VILANT 100-62.5-25 MCG/ACT IN AEPB: 1.0000 | INHALATION_SPRAY | Freq: Every day | RESPIRATORY_TRACT | 0 refills | Status: DC

## 2023-02-16 MED ORDER — ALBUTEROL SULFATE HFA 108 (90 BASE) MCG/ACT IN AERS: 2.0000 | INHALATION_SPRAY | Freq: Four times a day (QID) | RESPIRATORY_TRACT | 2 refills | Status: DC | PRN

## 2023-02-16 MED ORDER — VITAMIN B-12 1000 MCG PO TABS: 1000.0000 ug | ORAL_TABLET | Freq: Every day | ORAL | 3 refills | Status: DC

## 2023-02-16 NOTE — TOC Initial Note (Signed)
Transition of Care [facility]) - Initial/Assessment Note    Patient Details  Name: Michael Padilla MRN: [id number] Date of Birth: [birth year]  Transition of Care [facility]) CM/SW Contact:    Darolyn Rua, LCSW Phone Number: 02/16/2023, 3:06 PM  Clinical Narrative:                  CSW spoke with patient at bedside to discuss substance abuse resources , provided at bedside. CSW spoke with patient about PCP resources, provided list of [employer] PCP's in the area. Patient reports no additional needs at this time, states he has a ride from his sons at dc today. Code 44 provided.     Barriers to Discharge: No Barriers Identified   Patient Goals and CMS Choice Patient states their goals for this hospitalization and ongoing recovery are:: to go home CMS Medicare.gov Compare Post Acute Care list provided to:: Patient Choice offered to / list presented to : Patient      Expected Discharge Plan and Services       Living arrangements for the past 2 months: Single Family Home Expected Discharge Date: 02/16/23                                    Prior Living Arrangements/Services Living arrangements for the past 2 months: Single Family Home Lives with:: Self                   Activities of Daily Living Home Assistive Devices/Equipment: Environmental consultant (specify type), Cane (specify quad or straight), Eyeglasses, Oxygen ADL Screening (condition at time of admission) Patient's cognitive ability adequate to safely complete daily activities?: Yes Is the patient deaf or have difficulty hearing?: No Does the patient have difficulty seeing, even when wearing glasses/contacts?: No Does the patient have difficulty concentrating, remembering, or making decisions?: No Patient able to express need for assistance with ADLs?: Yes Does the patient have difficulty dressing or bathing?: No Independently performs ADLs?: Yes (appropriate for developmental age) Does the patient have difficulty walking or  climbing stairs?: Yes Weakness of Legs: Both Weakness of Arms/Hands: None  Permission Sought/Granted                  Emotional Assessment              Admission diagnosis:  COPD exacerbation [J44.1] Patient Active Problem List   Diagnosis Date Noted   History of CVA in adulthood 02/16/2023   Thrombocytosis 02/16/2023   COPD exacerbation 02/15/2023   Hypokalemia 01/08/2022   Vitamin B12 deficiency 01/07/2022   Vitamin D deficiency 01/07/2022   Essential hypertension 01/07/2022   Protein-calorie malnutrition, severe 01/05/2022   Acute respiratory failure with hypoxia 01/05/2022   Acute metabolic encephalopathy 01/03/2022   Acute CVA (cerebrovascular accident) 01/03/2022   Urinary tract infection 01/03/2022   Alcohol abuse 01/03/2022   Unresponsive episode 01/02/2022   Acute encephalopathy 01/02/2022   Acute hypoxemic respiratory failure due to COVID-19 11/19/2020   Gastroenteritis due to COVID-19 virus 11/17/2020   Pneumonia due to COVID-19 virus 11/17/2020   Hyponatremia 11/17/2020   Generalized weakness 11/17/2020   COPD with acute exacerbation 11/17/2020   SOB (shortness of breath) 01/16/2016   PCP:  Pcp, No Pharmacy:   [facility] [address] [place], [place] - [address] [place] [address] Phone: [phone] Fax: [phone]  [id number] [place],  - [phone]  [address] [place] [address] Phone: [phone] Fax: [phone]     Social Determinants of Health (SDOH) Social History: SDOH Screenings   Food Insecurity: No Food Insecurity (02/16/2023)  Housing: Low Risk  (02/16/2023)  Transportation Needs: No Transportation Needs (02/16/2023)  Utilities: Not At Risk (02/16/2023)  Tobacco Use: High Risk (02/16/2023)   SDOH Interventions:     Readmission Risk Interventions     No data to display

## 2023-02-16 NOTE — Discharge Summary (Signed)
ZETH BUDAY ZOX:[id number] DOB: 12-20-1955 DOA: 02/15/2023  PCP: Pcp, No  Admit date: 02/15/2023 Discharge date: 02/16/2023  Time spent: 35 minutes  Recommendations for Outpatient Follow-up:  Establish with a pcp F/u neurology (referred) Alcohol cessation     Discharge Diagnoses:  Principal Problem:   COPD with acute exacerbation Active Problems:   Alcohol abuse   Unresponsive episode   Essential hypertension   COPD exacerbation   History of CVA in adulthood   Thrombocytosis   Discharge Condition: stable  Diet recommendation: regular  Filed Weights   02/16/23 0731  Weight: 56 kg    History of present illness:  From admission h and p  Keyton L Mounger is a 67 y.o. male with medical history significant of  Etoh abuse , COPD , arthritis, hx of CVA 12/2021 for which he was treated with DAPT and then de-escalated to ASA, essential hypertension, vit d def, B12 def who presents to ED BIB EMS after found down at home in lethargic state. On arrival ems noted patient was hypoxic  to 80's and was placed on 12 L. Patient was also noted to be combative with pin point pupils. Per notes patient combative behavior resolved with resolution of hypoxemia. Per patient, he states the last thing he remembers is drinking a cup of coffee and later waking up in the ambulance. He notes while in the ambulance he felt as if he was going to pass out. He notes no fever no chills, chest pain , n/v/d  but was short of breath. He states he was at his baseline health prior to onset of episode.  Per patient,son states he was foaming at the mouth during episode.  Patient unable to give any further details. He currently notes no  focal weakness no n/v/d/f/c. He states he feel back to his baseline. He notes he has had prescription for O2 but has not used in O2 in 3 years.He also notes he was on Trelegy but has not used medication in one year as he ran out of medication. He states he does not go to the doctor. He  does note however that Trelegy worked well for him.  Hospital Course:  Patient with a history of htn, CVA, copd, and alcohol abuse (drinks the equivalent of 6-9 beers/day), who never goes to the doctor and is not currently taking medication aside from vitamin d, presents with an acute episode of unresponsiveness. Was at his baseline prior to the episode and by hospital day one had returned to his baseline. Concern for seizure and so neurology was consulted and EEG and contrast-enhanced MRI was obtained, both negative. Neurology did not advise anti-epileptics at discharge. He will be referred to neurology. He was also given substance abuse treatment resources and PCP resources. He was agreeable to re-starting trelegy and so that was prescribed. Cbc shows thrombocytosis, possibly secondary to b12 deficiency, will rx treatment. Smear negative, consider hematology referral at outpt f/u if patient chooses to pursue ongoing medical care.   Procedures: EEG   Consultations: neurology  Discharge Exam: Vitals:   02/16/23 1301 02/16/23 1400  BP: 128/86 (!) 151/88  Pulse: 88 98  Resp:    Temp:    SpO2:  92%    General: NAD Cardiovascular: RRR Respiratory: Few scattered rhonchi  Discharge Instructions   Discharge Instructions     Ambulatory referral to Neurology   Complete by: As directed    Diet - low sodium heart healthy   Complete by: As directed  Increase activity slowly   Complete by: As directed       Allergies as of 02/16/2023   No Known Allergies      Medication List     STOP taking these medications    amLODipine 10 MG tablet Commonly known as: NORVASC   Ipratropium-Albuterol 20-100 MCG/ACT Aers respimat Commonly known as: COMBIVENT   metoprolol tartrate 50 MG tablet Commonly known as: LOPRESSOR   pantoprazole 40 MG tablet Commonly known as: PROTONIX       TAKE these medications    albuterol 108 (90 Base) MCG/ACT inhaler Commonly known as: VENTOLIN  HFA Inhale 2 puffs into the lungs every 6 (six) hours as needed for wheezing or shortness of breath.   cyanocobalamin 1000 MCG tablet Commonly known as: VITAMIN B12 Take 1 tablet (1,000 mcg total) by mouth daily.   Fluticasone-Umeclidin-Vilant 100-62.5-25 MCG/ACT Aepb Commonly known as: Trelegy Ellipta Inhale 1 puff into the lungs daily.   Vitamin D (Ergocalciferol) 1.25 MG (50000 UNIT) Caps capsule Commonly known as: DRISDOL Take 50,000 Units by mouth every 7 (seven) days.       No Known Allergies    The results of significant diagnostics from this hospitalization (including imaging, microbiology, ancillary and laboratory) are listed below for reference.    Significant Diagnostic Studies: MR BRAIN W WO CONTRAST  Result Date: 02/16/2023 CLINICAL DATA:  Seizure, new onset. EXAM: MRI HEAD WITHOUT AND WITH CONTRAST TECHNIQUE: Multiplanar, multiecho pulse sequences of the brain and surrounding structures were obtained without and with intravenous contrast. CONTRAST:  5mL GADAVIST GADOBUTROL 1 MMOL/ML IV SOLN COMPARISON:  Head CT from earlier today FINDINGS: Brain: No acute infarction, hemorrhage, hydrocephalus, extra-axial collection or mass lesion. Mild for age cerebral volume loss. Periventricular chronic small vessel ischemia with possible tiny lacune at the left thalamus. No abnormal enhancement. No cortical finding to correlate with seizure history. Vascular: Normal flow voids. Skull and upper cervical spine: Normal marrow signal. Sinuses/Orbits: Partial left mastoid opacification also involving the middle ear. Negative nasopharynx. IMPRESSION: 1. No explanation for seizure. 2. Left mastoid and middle ear opacification since 2023. No underlying cause seen in the nasopharynx. 3. Motion degraded. Electronically Signed   By: Tiburcio Pea M.D.   On: 02/16/2023 12:20   CT HEAD WO CONTRAST ( )  Result Date: 02/16/2023 CLINICAL DATA:  Found down, lethargic. Syncope/presyncope,  cerebrovascular cause suspected EXAM: CT HEAD WITHOUT CONTRAST TECHNIQUE: Contiguous axial images were obtained from the base of the skull through the vertex without intravenous contrast. RADIATION DOSE REDUCTION: This exam was performed according to the departmental dose-optimization program which includes automated exposure control, adjustment of the mA and/or kV according to patient size and/or use of iterative reconstruction technique. COMPARISON:  01/02/2022 FINDINGS: Brain: No acute intracranial abnormality. Specifically, no hemorrhage, hydrocephalus, mass lesion, acute infarction, or significant intracranial injury. There is atrophy and chronic small vessel disease changes. Vascular: No hyperdense vessel or unexpected calcification. Skull: No acute calvarial abnormality. Sinuses/Orbits: No acute findings Other: None IMPRESSION: Atrophy, chronic microvascular disease. No acute intracranial abnormality. Electronically Signed   By: Charlett Nose M.D.   On: 02/16/2023 01:32   CT Angio Chest Pulmonary Embolism (PE) W or WO Contrast  Result Date: 02/15/2023 CLINICAL DATA:  Pulmonary embolism (PE) suspected, high prob EXAM: CT ANGIOGRAPHY CHEST WITH CONTRAST TECHNIQUE: Multidetector CT imaging of the chest was performed using the standard protocol during bolus administration of intravenous contrast. Multiplanar CT image reconstructions and MIPs were obtained to evaluate the vascular anatomy. RADIATION DOSE REDUCTION:  This exam was performed according to the departmental dose-optimization program which includes automated exposure control, adjustment of the mA and/or kV according to patient size and/or use of iterative reconstruction technique. CONTRAST:  75mL OMNIPAQUE IOHEXOL 350 MG/ML SOLN COMPARISON:  01/02/2022 FINDINGS: Cardiovascular: Heart is normal size. Aorta is normal caliber. No filling defects in the pulmonary arteries to suggest pulmonary emboli. Moderate aortic calcifications scattered coronary  artery calcifications. Mediastinum/Nodes: No mediastinal, hilar, or axillary adenopathy. Trachea and esophagus are unremarkable. Thyroid unremarkable. Lungs/Pleura: Moderate centrilobular emphysema. No confluent opacities or effusions. Upper Abdomen: No acute findings Musculoskeletal: Chest wall soft tissues are unremarkable. Stable chronic compression fractures at T11 and T12. Progressive compression fracture involving the inferior endplate of L1. Review of the MIP images confirms the above findings. IMPRESSION: No evidence of pulmonary embolus. No acute cardiopulmonary disease. Scattered aortic atherosclerosis. Progressive L1 compression fracture. Stable compression fractures at T11 and T12. Aortic Atherosclerosis (ICD10-I70.0) and Emphysema (ICD10-J43.9). Electronically Signed   By: Charlett Nose M.D.   On: 02/15/2023 21:53   DG Chest Port 1 View  Result Date: 02/15/2023 CLINICAL DATA:  Shortness of breath EXAM: PORTABLE CHEST 1 VIEW COMPARISON:  X-ray 01/06/2022 and older FINDINGS: Calcified and tortuous aorta. Normal cardiopericardial silhouette. Hyperinflation with diffuse interstitial changes which are unchanged from previous and likely chronic. No consolidation, pneumothorax or effusion. No edema. Overlapping cardiac leads. Degenerative changes are seen along the spine. IMPRESSION: Hyperinflation with chronic changes. Electronically Signed   By: Karen Kays M.D.   On: 02/15/2023 18:07    Microbiology: Recent Results (from the past 240 hour(s))  Respiratory (~20 pathogens) panel by PCR     Status: None   Collection Time: 02/15/23  5:35 AM   Specimen: Nasopharyngeal Swab; Respiratory  Result Value Ref Range Status   Adenovirus NOT DETECTED NOT DETECTED Final   Coronavirus 229E NOT DETECTED NOT DETECTED Final    Comment: (NOTE) The Coronavirus on the Respiratory Panel, DOES NOT test for the novel  Coronavirus (2019 nCoV)    Coronavirus HKU1 NOT DETECTED NOT DETECTED Final   Coronavirus NL63  NOT DETECTED NOT DETECTED Final   Coronavirus OC43 NOT DETECTED NOT DETECTED Final   Metapneumovirus NOT DETECTED NOT DETECTED Final   Rhinovirus / Enterovirus NOT DETECTED NOT DETECTED Final   Influenza A NOT DETECTED NOT DETECTED Final   Influenza B NOT DETECTED NOT DETECTED Final   Parainfluenza Virus 1 NOT DETECTED NOT DETECTED Final   Parainfluenza Virus 2 NOT DETECTED NOT DETECTED Final   Parainfluenza Virus 3 NOT DETECTED NOT DETECTED Final   Parainfluenza Virus 4 NOT DETECTED NOT DETECTED Final   Respiratory Syncytial Virus NOT DETECTED NOT DETECTED Final   Bordetella pertussis NOT DETECTED NOT DETECTED Final   Bordetella Parapertussis NOT DETECTED NOT DETECTED Final   Chlamydophila pneumoniae NOT DETECTED NOT DETECTED Final   Mycoplasma pneumoniae NOT DETECTED NOT DETECTED Final    Comment: Performed at [facility], [address], [place], [place] [address]  SARS Coronavirus 2 by RT PCR (hospital order, performed in [facility] hospital lab) *cepheid single result test* Anterior Nasal Swab     Status: None   Collection Time: 02/15/23  5:48 PM   Specimen: Anterior Nasal Swab  Result Value Ref Range Status   SARS Coronavirus 2 by RT PCR NEGATIVE NEGATIVE Final    Comment: (NOTE) SARS-CoV-2 target nucleic acids are NOT DETECTED.  The SARS-CoV-2 RNA is generally detectable in upper and lower respiratory specimens during the acute phase of  infection. The lowest concentration of SARS-CoV-2 viral copies this assay can detect is 250 copies / mL. A negative result does not preclude SARS-CoV-2 infection and should not be used as the sole basis for treatment or other patient management decisions.  A negative result may occur with improper specimen collection / handling, submission of specimen other than nasopharyngeal swab, presence of viral mutation(s) within the areas targeted by this assay, and inadequate number of viral copies (<250 copies / mL). A negative result must be  combined with clinical observations, patient history, and epidemiological information.  Fact Sheet for Patients:   RoadLapTop.co.za  Fact Sheet for Healthcare Providers: http://kim-miller.com/  This test is not yet approved or  cleared by the [place] FDA and has been authorized for detection and/or diagnosis of SARS-CoV-2 by FDA under an Emergency Use Authorization (EUA).  This EUA will remain in effect (meaning this test can be used) for the duration of the COVID-19 declaration under Section 564(b)(1) of the Act, 21 U.S.C. section 360bbb-3(b)(1), unless the authorization is terminated or revoked sooner.  Performed at [facility], [address] [place], [place] [address]      Labs: Basic Metabolic Panel: Recent Labs  Lab 02/15/23 1751 02/15/23 2110 02/16/23 0528 02/16/23 0535  NA 126*  --  129*  --   K 3.9  --  3.8  --   CL 95*  --  98  --   CO2 22  --  22  --   GLUCOSE 181*  --  198*  --   BUN 8  --  8  --   CREATININE 0.51* 0.48* 0.50*  --   CALCIUM 8.4*  --  8.0*  --   MG  --   --   --  2.2   Liver Function Tests: Recent Labs  Lab 02/15/23 1751 02/16/23 0528  AST 33 32  ALT 18 16  ALKPHOS 74 64  BILITOT 0.8 0.7  PROT 7.1 6.6  ALBUMIN 4.0 3.7   No results for input(s): "LIPASE", "AMYLASE" in the last 168 hours. No results for input(s): "AMMONIA" in the last 168 hours. CBC: Recent Labs  Lab 02/15/23 1751 02/15/23 2110 02/16/23 0528  WBC 7.9 11.7* 7.6  7.8  NEUTROABS 4.2  --  7.1  HGB 15.5 15.5 14.1  14.2  HCT 45.6 45.7 42.3  41.9  MCV 93.6 94.0 94.4  94.2  PLT 795* 719* 738*  719*   Cardiac Enzymes: No results for input(s): "CKTOTAL", "CKMB", "CKMBINDEX", "TROPONINI" in the last 168 hours. BNP: BNP (last 3 results) Recent Labs    02/15/23 1751  BNP 68.1    ProBNP (last 3 results) No results for input(s): "PROBNP" in the last 8760 hours.  CBG: Recent Labs  Lab  02/16/23 0741  GLUCAP 180*       Signed:  Silvano Bilis MD.  [facility] 02/16/2023, 2:41 PM

## 2023-02-16 NOTE — Progress Notes (Signed)
Eeg done 

## 2023-02-16 NOTE — Care Management CC44 (Signed)
Condition Code 44 Documentation Completed  Patient Details  Name: Michael Padilla MRN: [id number] Date of Birth: 11/16/1955   Condition Code 44 given:  Yes Patient signature on Condition Code 44 notice:  Yes Documentation of 2 MD's agreement:  Yes Code 44 added to claim:  Yes    Darolyn Rua, LCSW 02/16/2023, 3:05 PM

## 2023-02-16 NOTE — Care Management Obs Status (Signed)
MEDICARE OBSERVATION STATUS NOTIFICATION   Patient Details  Name: SALIH WILLIAMSON MRN: [id number] Date of Birth: 09-Feb-1956   Medicare Observation Status Notification Given:  Yes    Darolyn Rua, LCSW 02/16/2023, 3:05 PM

## 2023-02-16 NOTE — ED Notes (Signed)
Pt to MRI then will come back to room 33

## 2023-02-17 LAB — HEMOGLOBIN A1C
Hgb A1c MFr Bld: 5.5 % (ref 4.8–5.6)
Mean Plasma Glucose: 111 mg/dL

## 2023-02-19 NOTE — Procedures (Signed)
Routine EEG Report  Michael Padilla is a 67 y.o. male with a history of seizure who is undergoing an EEG to evaluate for seizures.  Report: This EEG was acquired with electrodes placed according to the International 10-20 electrode system (including Fp1, Fp2, F3, F4, C3, C4, P3, P4, O1, O2, T3, T4, T5, T6, A1, A2, Fz, Cz, Pz). The following electrodes were missing or displaced: none.  The occipital dominant rhythm was 9 Hz. This activity is reactive to stimulation. Drowsiness was manifested by background fragmentation; deeper stages of sleep were identified by K complexes and sleep spindles. There was no focal slowing. There were no interictal epileptiform discharges. There were no electrographic seizures identified. Photic stimulation and hyperventilation were not performed.   Impression: This EEG was obtained while awake and asleep and is normal.    Clinical Correlation: Normal EEGs, however, do not rule out epilepsy.  Bing Neighbors, MD [facility] [phone]  If 7pm- 7am, please page neurology on call as listed in AMION.

## 2023-02-24 DIAGNOSIS — U071 COVID-19: Secondary | ICD-10-CM | POA: Diagnosis not present

## 2023-02-24 DIAGNOSIS — J9601 Acute respiratory failure with hypoxia: Secondary | ICD-10-CM | POA: Diagnosis not present

## 2023-03-15 ENCOUNTER — Other Ambulatory Visit: Payer: Self-pay | Admitting: Obstetrics and Gynecology

## 2023-03-27 DIAGNOSIS — U071 COVID-19: Secondary | ICD-10-CM | POA: Diagnosis not present

## 2023-03-27 DIAGNOSIS — J9601 Acute respiratory failure with hypoxia: Secondary | ICD-10-CM | POA: Diagnosis not present

## 2023-04-26 DIAGNOSIS — J9601 Acute respiratory failure with hypoxia: Secondary | ICD-10-CM | POA: Diagnosis not present

## 2023-04-26 DIAGNOSIS — U071 COVID-19: Secondary | ICD-10-CM | POA: Diagnosis not present

## 2023-05-27 DIAGNOSIS — J9601 Acute respiratory failure with hypoxia: Secondary | ICD-10-CM | POA: Diagnosis not present

## 2023-05-27 DIAGNOSIS — U071 COVID-19: Secondary | ICD-10-CM | POA: Diagnosis not present

## 2023-06-27 DIAGNOSIS — U071 COVID-19: Secondary | ICD-10-CM | POA: Diagnosis not present

## 2023-06-27 DIAGNOSIS — J9601 Acute respiratory failure with hypoxia: Secondary | ICD-10-CM | POA: Diagnosis not present

## 2023-07-27 DIAGNOSIS — U071 COVID-19: Secondary | ICD-10-CM | POA: Diagnosis not present

## 2023-07-27 DIAGNOSIS — J9601 Acute respiratory failure with hypoxia: Secondary | ICD-10-CM | POA: Diagnosis not present

## 2023-08-27 DIAGNOSIS — J9601 Acute respiratory failure with hypoxia: Secondary | ICD-10-CM | POA: Diagnosis not present

## 2023-08-27 DIAGNOSIS — U071 COVID-19: Secondary | ICD-10-CM | POA: Diagnosis not present

## 2023-09-26 DIAGNOSIS — U071 COVID-19: Secondary | ICD-10-CM | POA: Diagnosis not present

## 2023-09-26 DIAGNOSIS — J9601 Acute respiratory failure with hypoxia: Secondary | ICD-10-CM | POA: Diagnosis not present

## 2024-03-03 ENCOUNTER — Inpatient Hospital Stay
Admission: EM | Admit: 2024-03-03 | Discharge: 2024-03-11 | DRG: 640 | Disposition: A | Discharge status: Home Health | Attending: Internal Medicine | Admitting: Internal Medicine

## 2024-03-03 ENCOUNTER — Other Ambulatory Visit: Payer: Self-pay

## 2024-03-03 ENCOUNTER — Encounter: Payer: Self-pay | Admitting: Family Medicine

## 2024-03-03 ENCOUNTER — Emergency Department

## 2024-03-03 DIAGNOSIS — R531 Weakness: Secondary | ICD-10-CM

## 2024-03-03 DIAGNOSIS — Z9981 Dependence on supplemental oxygen: Secondary | ICD-10-CM

## 2024-03-03 DIAGNOSIS — I4510 Unspecified right bundle-branch block: Secondary | ICD-10-CM | POA: Diagnosis present

## 2024-03-03 DIAGNOSIS — Z1152 Encounter for screening for COVID-19: Secondary | ICD-10-CM

## 2024-03-03 DIAGNOSIS — E8770 Fluid overload, unspecified: Secondary | ICD-10-CM

## 2024-03-03 DIAGNOSIS — I5023 Acute on chronic systolic (congestive) heart failure: Secondary | ICD-10-CM | POA: Diagnosis present

## 2024-03-03 DIAGNOSIS — R7989 Other specified abnormal findings of blood chemistry: Secondary | ICD-10-CM | POA: Insufficient documentation

## 2024-03-03 DIAGNOSIS — Z720 Tobacco use: Secondary | ICD-10-CM

## 2024-03-03 DIAGNOSIS — I5A Non-ischemic myocardial injury (non-traumatic): Secondary | ICD-10-CM | POA: Diagnosis present

## 2024-03-03 DIAGNOSIS — J441 Chronic obstructive pulmonary disease with (acute) exacerbation: Secondary | ICD-10-CM | POA: Diagnosis present

## 2024-03-03 DIAGNOSIS — Z604 Social exclusion and rejection: Secondary | ICD-10-CM | POA: Diagnosis present

## 2024-03-03 DIAGNOSIS — E43 Unspecified severe protein-calorie malnutrition: Secondary | ICD-10-CM | POA: Diagnosis present

## 2024-03-03 DIAGNOSIS — Z8673 Personal history of transient ischemic attack (TIA), and cerebral infarction without residual deficits: Secondary | ICD-10-CM

## 2024-03-03 DIAGNOSIS — E871 Hypo-osmolality and hyponatremia: Principal | ICD-10-CM | POA: Diagnosis present

## 2024-03-03 DIAGNOSIS — R002 Palpitations: Secondary | ICD-10-CM | POA: Diagnosis not present

## 2024-03-03 DIAGNOSIS — J9621 Acute and chronic respiratory failure with hypoxia: Secondary | ICD-10-CM | POA: Diagnosis not present

## 2024-03-03 DIAGNOSIS — J449 Chronic obstructive pulmonary disease, unspecified: Secondary | ICD-10-CM | POA: Diagnosis present

## 2024-03-03 DIAGNOSIS — Z6823 Body mass index (BMI) 23.0-23.9, adult: Secondary | ICD-10-CM

## 2024-03-03 DIAGNOSIS — Z91199 Patient's noncompliance with other medical treatment and regimen due to unspecified reason: Secondary | ICD-10-CM

## 2024-03-03 DIAGNOSIS — Z8249 Family history of ischemic heart disease and other diseases of the circulatory system: Secondary | ICD-10-CM

## 2024-03-03 DIAGNOSIS — I10 Essential (primary) hypertension: Secondary | ICD-10-CM | POA: Diagnosis present

## 2024-03-03 DIAGNOSIS — F1010 Alcohol abuse, uncomplicated: Secondary | ICD-10-CM | POA: Diagnosis not present

## 2024-03-03 DIAGNOSIS — E876 Hypokalemia: Secondary | ICD-10-CM | POA: Diagnosis not present

## 2024-03-03 DIAGNOSIS — I2489 Other forms of acute ischemic heart disease: Secondary | ICD-10-CM | POA: Diagnosis present

## 2024-03-03 DIAGNOSIS — R251 Tremor, unspecified: Secondary | ICD-10-CM | POA: Diagnosis not present

## 2024-03-03 DIAGNOSIS — F17210 Nicotine dependence, cigarettes, uncomplicated: Secondary | ICD-10-CM | POA: Diagnosis present

## 2024-03-03 DIAGNOSIS — M7989 Other specified soft tissue disorders: Secondary | ICD-10-CM | POA: Diagnosis present

## 2024-03-03 DIAGNOSIS — I110 Hypertensive heart disease with heart failure: Secondary | ICD-10-CM | POA: Diagnosis present

## 2024-03-03 DIAGNOSIS — R0600 Dyspnea, unspecified: Secondary | ICD-10-CM

## 2024-03-03 LAB — CBC
HCT: 44.8 % (ref 39.0–52.0)
Hemoglobin: 15.4 g/dL (ref 13.0–17.0)
MCH: 30.6 pg (ref 26.0–34.0)
MCHC: 34.4 g/dL (ref 30.0–36.0)
MCV: 88.9 fL (ref 80.0–100.0)
Platelets: 729 10*3/uL — ABNORMAL HIGH (ref 150–400)
RBC: 5.04 MIL/uL (ref 4.22–5.81)
RDW: 13.2 % (ref 11.5–15.5)
WBC: 8.7 10*3/uL (ref 4.0–10.5)
nRBC: 0 % (ref 0.0–0.2)

## 2024-03-03 LAB — OSMOLALITY: Osmolality: 241 mosm/kg — CL (ref 275–295)

## 2024-03-03 LAB — CREATININE, URINE, RANDOM: Creatinine, Urine: <10 mg/dL

## 2024-03-03 LAB — COMPREHENSIVE METABOLIC PANEL WITH GFR
ALT: 27 U/L (ref 0–44)
AST: 48 U/L — ABNORMAL HIGH (ref 15–41)
Albumin: 3.8 g/dL (ref 3.5–5.0)
Alkaline Phosphatase: 88 U/L (ref 38–126)
Anion gap: 12 (ref 5–15)
BUN: <5 mg/dL — ABNORMAL LOW (ref 8–23)
CO2: 24 mmol/L (ref 22–32)
Calcium: 8.6 mg/dL — ABNORMAL LOW (ref 8.9–10.3)
Chloride: 80 mmol/L — ABNORMAL LOW (ref 98–111)
Creatinine, Ser: 0.46 mg/dL — ABNORMAL LOW (ref 0.61–1.24)
GFR, Estimated: >60 mL/min (ref >60)
Glucose, Bld: 78 mg/dL (ref 70–99)
Potassium: 4.5 mmol/L (ref 3.5–5.1)
Sodium: 116 mmol/L — CL (ref 135–145)
Total Bilirubin: 1.7 mg/dL — ABNORMAL HIGH (ref 0.0–1.2)
Total Protein: 7 g/dL (ref 6.5–8.1)

## 2024-03-03 LAB — RESP PANEL BY RT-PCR (RSV, FLU A&B, COVID)  RVPGX2
Influenza A by PCR: NEGATIVE
Influenza B by PCR: NEGATIVE
Resp Syncytial Virus by PCR: NEGATIVE
SARS Coronavirus 2 by RT PCR: NEGATIVE

## 2024-03-03 LAB — BRAIN NATRIURETIC PEPTIDE: B Natriuretic Peptide: 1147.7 pg/mL — ABNORMAL HIGH (ref 0.0–100.0)

## 2024-03-03 LAB — SODIUM, URINE, RANDOM: Sodium, Ur: 90 mmol/L

## 2024-03-03 LAB — SODIUM
Sodium: 117 mmol/L — CL (ref 135–145)
Sodium: 118 mmol/L — CL (ref 135–145)
Sodium: 119 mmol/L — CL (ref 135–145)

## 2024-03-03 LAB — HIV ANTIBODY (ROUTINE TESTING W REFLEX): HIV Screen 4th Generation wRfx: NONREACTIVE

## 2024-03-03 LAB — TROPONIN I (HIGH SENSITIVITY)
Troponin I (High Sensitivity): 66 ng/L — ABNORMAL HIGH (ref <18)
Troponin I (High Sensitivity): 62 ng/L — ABNORMAL HIGH (ref <18)

## 2024-03-03 LAB — ETHANOL: Alcohol, Ethyl (B): <15 mg/dL (ref <15)

## 2024-03-03 LAB — TSH: TSH: 1.065 u[IU]/mL (ref 0.350–4.500)

## 2024-03-03 LAB — OSMOLALITY, URINE: Osmolality, Ur: 226 mosm/kg — ABNORMAL LOW (ref 300–900)

## 2024-03-03 MED ORDER — SODIUM CHLORIDE 0.9 % IV SOLN
500.0000 mg | INTRAVENOUS | Status: AC
  Administered 2024-03-03: 500 mg via INTRAVENOUS
  Filled 2024-03-03: qty 5

## 2024-03-03 MED ORDER — FUROSEMIDE 10 MG/ML IJ SOLN
40.0000 mg | Freq: Two times a day (BID) | INTRAMUSCULAR | Status: DC
  Administered 2024-03-03 – 2024-03-06 (×7): 40 mg via INTRAVENOUS
  Filled 2024-03-03 (×7): qty 4

## 2024-03-03 MED ORDER — METHYLPREDNISOLONE SODIUM SUCC 125 MG IJ SOLR
125.0000 mg | Freq: Once | INTRAMUSCULAR | Status: AC
  Administered 2024-03-03: 125 mg via INTRAVENOUS
  Filled 2024-03-03: qty 2

## 2024-03-03 MED ORDER — LISINOPRIL 5 MG PO TABS
2.5000 mg | ORAL_TABLET | Freq: Every day | ORAL | Status: DC
  Administered 2024-03-04 – 2024-03-06 (×3): 2.5 mg via ORAL
  Filled 2024-03-03 (×3): qty 1

## 2024-03-03 MED ORDER — SODIUM CHLORIDE 0.9 % IV BOLUS
500.0000 mL | Freq: Once | INTRAVENOUS | Status: AC
  Administered 2024-03-03: 500 mL via INTRAVENOUS

## 2024-03-03 MED ORDER — METHYLPREDNISOLONE SODIUM SUCC 125 MG IJ SOLR
125.0000 mg | Freq: Two times a day (BID) | INTRAMUSCULAR | Status: AC
  Administered 2024-03-03 – 2024-03-04 (×2): 125 mg via INTRAVENOUS
  Filled 2024-03-03 (×2): qty 2

## 2024-03-03 MED ORDER — ONDANSETRON HCL 4 MG/2ML IJ SOLN: 4.0000 mg | Freq: Four times a day (QID) | INTRAMUSCULAR | Status: DC | PRN

## 2024-03-03 MED ORDER — PREDNISONE 20 MG PO TABS
40.0000 mg | ORAL_TABLET | Freq: Every day | ORAL | Status: AC
  Administered 2024-03-05 – 2024-03-08 (×4): 40 mg via ORAL
  Filled 2024-03-03 (×4): qty 2

## 2024-03-03 MED ORDER — ADULT MULTIVITAMIN W/MINERALS CH
1.0000 | ORAL_TABLET | Freq: Every day | ORAL | Status: DC
  Administered 2024-03-03 – 2024-03-11 (×9): 1 via ORAL
  Filled 2024-03-03 (×9): qty 1

## 2024-03-03 MED ORDER — IPRATROPIUM-ALBUTEROL 0.5-2.5 (3) MG/3ML IN SOLN
3.0000 mL | Freq: Four times a day (QID) | RESPIRATORY_TRACT | Status: DC
  Administered 2024-03-03 – 2024-03-04 (×4): 3 mL via RESPIRATORY_TRACT
  Filled 2024-03-03 (×4): qty 3

## 2024-03-03 MED ORDER — ACETAMINOPHEN 325 MG PO TABS: 650.0000 mg | ORAL_TABLET | Freq: Four times a day (QID) | ORAL | Status: DC | PRN

## 2024-03-03 MED ORDER — SODIUM CHLORIDE 0.9 % IV SOLN: Freq: Once | INTRAVENOUS | Status: AC

## 2024-03-03 MED ORDER — IPRATROPIUM-ALBUTEROL 0.5-2.5 (3) MG/3ML IN SOLN
3.0000 mL | Freq: Once | RESPIRATORY_TRACT | Status: AC
  Administered 2024-03-03: 3 mL via RESPIRATORY_TRACT
  Filled 2024-03-03: qty 3

## 2024-03-03 MED ORDER — SODIUM CHLORIDE 0.9% FLUSH
3.0000 mL | Freq: Two times a day (BID) | INTRAVENOUS | Status: DC
  Administered 2024-03-03 – 2024-03-11 (×17): 3 mL via INTRAVENOUS

## 2024-03-03 MED ORDER — ONDANSETRON HCL 4 MG PO TABS: 4.0000 mg | ORAL_TABLET | Freq: Four times a day (QID) | ORAL | Status: DC | PRN

## 2024-03-03 MED ORDER — SODIUM CHLORIDE 0.9% FLUSH: 3.0000 mL | INTRAVENOUS | Status: DC | PRN

## 2024-03-03 MED ORDER — LORAZEPAM 1 MG PO TABS
1.0000 mg | ORAL_TABLET | ORAL | Status: AC | PRN
  Administered 2024-03-04: 1 mg via ORAL
  Filled 2024-03-03: qty 1

## 2024-03-03 MED ORDER — LORAZEPAM 2 MG/ML IJ SOLN: 1.0000 mg | INTRAMUSCULAR | Status: AC | PRN

## 2024-03-03 MED ORDER — AZITHROMYCIN 250 MG PO TABS
500.0000 mg | ORAL_TABLET | Freq: Every day | ORAL | Status: AC
  Administered 2024-03-04 – 2024-03-05 (×2): 500 mg via ORAL
  Filled 2024-03-03 (×2): qty 2

## 2024-03-03 MED ORDER — FUROSEMIDE 10 MG/ML IJ SOLN
40.0000 mg | Freq: Once | INTRAMUSCULAR | Status: AC
  Administered 2024-03-03: 40 mg via INTRAVENOUS
  Filled 2024-03-03: qty 4

## 2024-03-03 MED ORDER — ENOXAPARIN SODIUM 40 MG/0.4ML IJ SOSY
40.0000 mg | PREFILLED_SYRINGE | INTRAMUSCULAR | Status: DC
  Administered 2024-03-03 – 2024-03-10 (×8): 40 mg via SUBCUTANEOUS
  Filled 2024-03-03 (×8): qty 0.4

## 2024-03-03 MED ORDER — ALBUTEROL SULFATE (2.5 MG/3ML) 0.083% IN NEBU: 2.5000 mg | INHALATION_SOLUTION | RESPIRATORY_TRACT | Status: DC | PRN

## 2024-03-03 MED ORDER — FOLIC ACID 1 MG PO TABS
1.0000 mg | ORAL_TABLET | Freq: Every day | ORAL | Status: DC
Start: 2024-03-03 — End: 2024-03-11
  Administered 2024-03-03 – 2024-03-11 (×9): 1 mg via ORAL
  Filled 2024-03-03 (×10): qty 1

## 2024-03-03 MED ORDER — THIAMINE MONONITRATE 100 MG PO TABS
100.0000 mg | ORAL_TABLET | Freq: Every day | ORAL | Status: DC
  Administered 2024-03-03 – 2024-03-04 (×2): 100 mg via ORAL
  Filled 2024-03-03 (×2): qty 1

## 2024-03-03 MED ORDER — SODIUM CHLORIDE 0.9 % IV SOLN: 250.0000 mL | INTRAVENOUS | Status: AC | PRN

## 2024-03-03 MED ORDER — THIAMINE HCL 100 MG/ML IJ SOLN: 100.0000 mg | Freq: Every day | INTRAMUSCULAR | Status: DC

## 2024-03-03 NOTE — ED Triage Notes (Signed)
 Pt arrives via [facility] from home for SOB. Pt smokes 1ppd. Pt non-compliant with home oxygen . Pt c/o dyspnea x4 days. SpO2 on RA 69% per EMS. Improves to 95% on 4lpm. Per EMS, lung sounds clear. Pt reports SOB is worse with exertion.

## 2024-03-03 NOTE — ED Notes (Signed)
 Pt having to take several deep breaths to try to catch up on their breathing between taking pills.

## 2024-03-03 NOTE — Assessment & Plan Note (Addendum)
 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

## 2024-03-03 NOTE — Assessment & Plan Note (Signed)
 Chronic generalized weakness in setting of heavy regular alcohol use Nonfocal neuroexam Monitor

## 2024-03-03 NOTE — H&P (Addendum)
 History and Physical    Patient: Michael Padilla WNU:[id number] DOB: December 15, 1955 DOA: 03/03/2024 DOS: the patient was seen and examined on 03/03/2024 PCP: Pcp, No  Patient coming from: Home  Chief Complaint:  Chief Complaint  Patient presents with   Shortness of Breath   HPI: Michael Padilla is a 68 y.o. male with medical history significant of Etoh abuse , COPD , arthritis, hx of CVA, essential hypertension, vit d def, B12 def presenting with acute on chronic respiratory failure hypoxia, volume overload, acute on chronic hyponatremia, alcohol abuse.  History from patient as well as the son at the bedside.  Per report, patient with increased work of breathing and shortness of breath the past 4 to 5 days. History of heavy alcohol use including at least 4 to 540 ounce beers daily.  Also smoking 1 to 2 packs/day.  Patient is supposed to wear 2 L nasal cannula at home chronically in setting of chronic respiratory failure.  However patient has been noncompliant for several months.  Positive orthopnea, PND.  Positive cough and trace wheezing.  Positive lower extremity swelling.  No fevers or chills.  Positive abdominal swelling.  No focal hemiparesis or confusion. Presented to the ER afebrile, heart rate 100s, BP stable.  Satting mid 90s on room air.  White count 8.7, hemoglobin 15.4, platelets 729, troponin in the 60s.  Sodium 116.  COVID flu and RSV negative.  Creatinine 0.46.  BNP 1148.  Chest x-ray with interstitial edema and pleural effusions. Review of Systems: As mentioned in the history of present illness. All other systems reviewed and are negative. Past Medical History:  Diagnosis Date   Arthritis    COPD (chronic obstructive pulmonary disease) (HCC)    Past Surgical History:  Procedure Laterality Date   none     Social History:  reports that he has been smoking cigarettes. He has never used smokeless tobacco. He reports current alcohol use of about 29.0 standard drinks of alcohol per  week. He reports that he does not use drugs.  No Known Allergies  Family History  Problem Relation Age of Onset   Hypertension Mother     Prior to Admission medications   Medication Sig Start Date End Date Taking? Authorizing Provider  acetaminophen  (TYLENOL ) 500 MG tablet Take 500 mg by mouth every 6 (six) hours as needed for mild pain (pain score 1-3), fever or headache.   Yes [provider]  ibuprofen (ADVIL) 200 MG tablet Take 200 mg by mouth every 6 (six) hours as needed for headache, mild pain (pain score 1-3) or fever.   Yes [provider]  albuterol  (VENTOLIN  HFA) 108 (90 Base) MCG/ACT inhaler Inhale 2 puffs into the lungs every 6 (six) hours as needed for wheezing or shortness of breath. Patient not taking: Reported on 03/03/2024 02/16/23   Janeane Mealy, MD  Fluticasone -Umeclidin-Vilant (TRELEGY ELLIPTA ) 100-62.5-25 MCG/ACT AEPB Inhale 1 puff into the lungs daily. Patient not taking: Reported on 03/03/2024 02/16/23   Wouk, Haynes Lips, MD  vitamin B-12 (VITAMIN B12) 1000 MCG tablet Take 1 tablet (1,000 mcg total) by mouth daily. Patient not taking: Reported on 03/03/2024 02/16/23   Janeane Mealy, MD  Vitamin D , Ergocalciferol , (DRISDOL ) 1.25 MG (50000 UNIT) CAPS capsule Take 50,000 Units by mouth every 7 (seven) days. Patient not taking: Reported on 03/03/2024    [provider]    Physical Exam: Vitals:   03/03/24 1000 03/03/24 1030 03/03/24 1100 03/03/24 1130  BP: (!) 156/104 Aaron Aas)  158/98 (!) 152/90 (!) 137/90  Pulse: (!) 51 (!) 105 (!) 111 (!) 104  Resp: (!) 31 (!) 26 19 17   Temp:      TempSrc:      SpO2: 92% 93% 93% 91%  Height:       Physical Exam Constitutional:      Comments: Underweight   HENT:     Head: Normocephalic and atraumatic.     Mouth/Throat:     Mouth: Mucous membranes are dry.  Eyes:     Pupils: Pupils are equal, round, and reactive to light.  Cardiovascular:     Rate and Rhythm: Regular rhythm.  Pulmonary:      Effort: Pulmonary effort is normal.  Abdominal:     General: Bowel sounds are normal.  Musculoskeletal:        General: Normal range of motion.  Skin:    General: Skin is dry.  Neurological:     General: No focal deficit present.  Psychiatric:        Mood and Affect: Mood normal.     Data Reviewed:  There are no new results to review at this time.  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 Does not appear to be in acute COPD exacerbation at present IV Lasix  2D echo Strict ins and outs and daily weights Low threshold to consult cardiology with concern for alcoholic cardiomyopathy Follow closely  Alcohol abuse Patient regular 40+ ounce multiple beer intake daily EtOH level pending Start CIWA protocol Monitor  Elevated troponin Trop 60s on presentation  No active chest pain  EKG stable  Suspect minimal demand ischemia in setting of acute on chronic resp failure, volume overload  Baby ASA  Monitor   COPD (chronic obstructive pulmonary disease) (HCC) Baseline COPD Does not appear to be in acute exacerbation though with noted concomitant volume overload Continue home inhalers Monitor  Essential hypertension BP stable Titrate home regimen  Generalized weakness Chronic generalized weakness in setting of heavy regular alcohol use Nonfocal neuroexam Monitor      Advance Care  Planning:   Code Status: Full Code   Consults: None   Family Communication: Son at the bedside   Severity of Illness: The appropriate patient status for this patient is OBSERVATION. Observation status is judged to be reasonable and necessary in order to provide the required intensity of service to ensure the patient's safety. The patient's presenting symptoms, physical exam findings, and initial radiographic and laboratory data in the context of their medical condition is felt to place them at decreased risk for further clinical deterioration. Furthermore, it is anticipated that the patient will be medically stable for discharge from the hospital within 2 midnights of admission.   Author: Corrinne Din, MD 03/03/2024 12:02 PM  For on call review [email or web address].

## 2024-03-03 NOTE — Consult Note (Signed)
 [facility] Cardiology  CARDIOLOGY CONSULT NOTE  Patient ID: NOMAN VANDAGRIFF MRN: [id number] DOB/AGE: 1956/08/25 68 y.o.  Admit date: 03/03/2024 Referring Physician Dr. Daisey Dryer Reason for Consultation acute heart failure  HPI: 68 year old male with past medical history significant for significant alcohol and tobacco abuse, COPD, history of CVA, hypertension who presented to hospital with worsening shortness of breath.  Patient details that he has increased shortness of breath doing anything in last 5 days associated with increased pedal edema and orthopnea.  No chest pain/pressure.  No palpitation.  He drinks about 60 to 80 ounces of beer every day since age of 75 and also smokes about 1 to 2 packs of cigarettes.  Labs significant for elevated BNP of 1100, minimal and flat troponin, sodium 116.  Chest x-ray with mild pulmonary vascular congestion and small pleural effusion.  EKG showed sinus tachycardia with PACs  Review of systems complete and found to be negative unless listed above     Past Medical History:  Diagnosis Date   Arthritis    COPD (chronic obstructive pulmonary disease) (HCC)     Past Surgical History:  Procedure Laterality Date   none      (Not in a hospital admission)  Social History   Socioeconomic History   Marital status: Divorced    Spouse name: Not on file   Number of children: Not on file   Years of education: Not on file   Highest education level: Not on file  Occupational History   Not on file  Tobacco Use   Smoking status: Every Day    Current packs/day: 2.00    Types: Cigarettes   Smokeless tobacco: Never  Vaping Use   Vaping status: Not on file  Substance and Sexual Activity   Alcohol use: Yes    Alcohol/week: 29.0 standard drinks of alcohol    Types: 28 Cans of beer, 1 Standard drinks or equivalent per week   Drug use: No   Sexual activity: Not on file  Other Topics Concern   Not on file  Social History Narrative   Not on file   Social  Drivers of Health   Financial Resource Strain: Not on file  Food Insecurity: No Food Insecurity (03/03/2024)   Hunger Vital Sign    Worried About Running Out of Food in the Last Year: Never true    Ran Out of Food in the Last Year: Never true  Transportation Needs: No Transportation Needs (03/03/2024)   PRAPARE - Administrator, Civil Service (Medical): No    Lack of Transportation (Non-Medical): No  Physical Activity: Not on file  Stress: Not on file  Social Connections: Socially Isolated (03/03/2024)   Social Connection and Isolation Panel [NHANES]    Frequency of Communication with Friends and Family: More than three times a week    Frequency of Social Gatherings with Friends and Family: Twice a week    Attends Religious Services: Never    Database administrator or Organizations: No    Attends Banker Meetings: Never    Marital Status: Divorced  Catering manager Violence: Not At Risk (03/03/2024)   Humiliation, Afraid, Rape, and Kick questionnaire    Fear of Current or Ex-Partner: No    Emotionally Abused: No    Physically Abused: No    Sexually Abused: No    Family History  Problem Relation Age of Onset   Hypertension Mother       Review of systems complete and found  to be negative unless listed above      PHYSICAL EXAM  Alert and oriented x 3. +1 to +2 pedal edema Elevated JVD No significant murmur.  Mild tachycardia.  Labs:   Lab Results  Component Value Date   WBC 8.7 03/03/2024   HGB 15.4 03/03/2024   HCT 44.8 03/03/2024   MCV 88.9 03/03/2024   PLT 729 (H) 03/03/2024    Recent Labs  Lab 03/03/24 0822 03/03/24 1054  NA 116* 117*  K 4.5  --   CL 80*  --   CO2 24  --   BUN <5*  --   CREATININE 0.46*  --   CALCIUM 8.6*  --   PROT 7.0  --   BILITOT 1.7*  --   ALKPHOS 88  --   ALT 27  --   AST 48*  --   GLUCOSE 78  --    Lab Results  Component Value Date   CKTOTAL 167 11/17/2020   TROPONINI <0.03 01/16/2016   No  results found for: "CHOL" No results found for: "HDL" No results found for: "LDLCALC" No results found for: "TRIG" No results found for: "CHOLHDL" Lab Results  Component Value Date   LDLDIRECT 33.2 01/03/2022      Radiology: DG Chest Portable 1 View Result Date: 03/03/2024 CLINICAL DATA:  Shortness of breath. EXAM: PORTABLE CHEST 1 VIEW COMPARISON:  02/15/2023 FINDINGS: Stable cardiomediastinal contours. Aortic atherosclerosis. Mild interstitial edema and small pleural effusions identified. No airspace consolidation. Visualized osseous structures are unremarkable. IMPRESSION: Mild interstitial edema and small pleural effusions. Correlate for signs/symptoms of congestive heart failure. Electronically Signed   By: Kimberley Penman M.D.   On: 03/03/2024 08:55     ASSESSMENT AND PLAN:  Acute heart failure COPD Hyponatremia Significant alcohol and tobacco abuse History of CVA  Agree with current IV diuresis using IV Lasix  40 mg twice daily.  Strict I's/O, daily weight and electrolytes.  Goal negative about 2 L per 24 hours.  Keep potassium greater than 4 and magnesium greater than 2. Echocardiogram pending.  Concerned about LV systolic dysfunction with significant alcohol use. Further GDMT after diuresis and echocardiogram. Strongly counseled about alcohol and smoking cessation  Signed: Janette Medley MD 03/03/2024, 3:41 PM

## 2024-03-03 NOTE — Progress Notes (Signed)
 PT Cancellation Note  Patient Details Name: Michael Padilla MRN: [id number] DOB: Sep 22, 1956   Cancelled Treatment:     PT evaluation and treatment not completed 2/2 pt reported of  feeling weak and HR at rest 104 to 117/min with SPO2 91% with O2 supplement. Pt refused to participate today. Son also requested the same.  Pt deferred PT communicated with  MD and Nurse about pt symptoms. MD and Nurse agreed.  PT will attempt again tomorrow.    [name] 03/03/2024, 12:12 PM

## 2024-03-03 NOTE — Progress Notes (Signed)
 Per nursing:  "Hey! So, pt's breathing seems to be getting a bit worse. He had a little coughing fit and his O2 got down to 86%. I put his O2 up to 5L and it's a but better at 91%. He's also become more tachy at 123 sustained. "  Will check EKG x 1 Lasix  x 1 Start COPD protocol  Dr. Bob Burn with cardiology consulted  Monitor

## 2024-03-03 NOTE — Progress Notes (Signed)
 OT Cancellation Note  Patient Details Name: Michael Padilla MRN: [id number] DOB: [birth year]   Cancelled Treatment:    Reason Eval/Treat Not Completed: Medical issues which prohibited therapy. Per discussion with PT the pt reported of feeling weak and HR at rest 104 to 117/min with SPO2 91% with O2 supplement. Pt declined further participate with therapy on this date. MD aware of pt status. Will re-attempt OT evaluation on next available date/time.   Rosaria Common M.S. OTR/L  03/03/24, 12:51 PM

## 2024-03-03 NOTE — Assessment & Plan Note (Signed)
 Sodium 116 today with baseline sodium around 129 Appears to be acute on chronic issue in setting of heavy beer intake Hypervolemic volume status at present Start diuresis with IV Lasix  Check urine and serum studies Goal 6-8 mill equivalent change over the next 12 to 24 hours Follow closely

## 2024-03-03 NOTE — ED Notes (Signed)
 This RN at bedside, pt O2 86% on baseline 2L [place]. Reports that he was struggling to use the urinal and it made him SOB. O2 increased to 4L [place] and pt sat 92% on 4L . Odilia Bennett, primary RN made aware.

## 2024-03-03 NOTE — ED Provider Notes (Signed)
 [facility] Provider Note    Event Date/Time   First MD Initiated Contact with Patient 03/03/24 0820     (approximate)  History   Chief Complaint: Shortness of Breath  HPI  Michael Padilla is a 68 y.o. male with a past medical history of COPD, arthritis, presents to the emergency department for shortness of breath.  Patient is a current smoker approximately 1 pack/day per patient.  Patient is supposed be wearing 2 L of oxygen  at home however has been noncompliant with his oxygen .  Patient states worsening shortness of breath over the last 4 days especially worse last night.  EMS states upon arrival patient satting 69% on room air improved to 95% on 4 L with the patient is currently on.  Patient states mild chest tightness mild increase in cough but no fever.  Physical Exam   Triage Vital Signs: ED Triage Vitals  Encounter Vitals Group     BP --      Systolic BP Percentile --      Diastolic BP Percentile --      Pulse --      Resp --      Temp --      Temp src --      SpO2 03/03/24 0818 100 %     Weight --      Height 03/03/24 0819 5\' 8"  (1.727 m)     Head Circumference --      Peak Flow --      Pain Score 03/03/24 0819 0     Pain Loc --      Pain Education --      Exclude from Growth Chart --     Most recent vital signs: Vitals:   03/03/24 0818  SpO2: 100%    General: Awake, mild respiratory distress sitting upright in bed speaking in 2-3 word sentences. CV:  Good peripheral perfusion.  Regular rate and rhythm around 100 bpm. Resp:  Mild tachypnea.  Decreased air movement bilaterally with minimal expiratory wheeze.  No rales or rhonchi. Abd:  No distention.  Soft, nontender.  No rebound or guarding. Other:  Trace lower extremity edema bilaterally.  ED Results / Procedures / Treatments   EKG  EKG viewed and interpreted by myself shows sinus tachycardia 117 bpm with a slightly widened QRS, normal axis, normal intervals, nonspecific ST  changes without ST elevation.  RADIOLOGY  I have reviewed and interpreted chest x-ray images.  No consolidation on my evaluation. Radiology is read the x-ray as consistent with mild interstitial edema.   MEDICATIONS ORDERED IN ED: Medications  methylPREDNISolone  sodium succinate (SOLU-MEDROL ) 125 mg/2 mL injection 125 mg (has no administration in time range)  ipratropium-albuterol  (DUONEB) 0.5-2.5 (3) MG/3ML nebulizer solution 3 mL (has no administration in time range)  ipratropium-albuterol  (DUONEB) 0.5-2.5 (3) MG/3ML nebulizer solution 3 mL (has no administration in time range)     IMPRESSION / MDM / ASSESSMENT AND PLAN / ED COURSE  I reviewed the triage vital signs and the nursing notes.  Patient's presentation is most consistent with acute presentation with potential threat to life or bodily function.  Patient presents the emergency department for worsening shortness of breath.  Patient has a history of COPD supposed to be wearing 2 L at home but does not.  Patient was satting 69% on room air with EMS currently satting in the mid 90s on 4 L nasal cannula.  Patient has tight lung sounds bilaterally with decreased air movement  we will dose Solu-Medrol  and DuoNebs.  Given the patient's mild chest tightness we will check labs including a troponin and BNP.  Will also obtain a chest x-ray given the patient's reported cough.  Differential would include COPD exacerbation, possible CHF or ACS, pneumonia.  Patient's chest x-ray shows signs of mild interstitial edema.  Patient's lab work shows a reassuring CBC.  Patient's chemistry however shows significant hyponatremia which is likely the cause of the patient's weakness fatigue and possibly respiratory issues as well.  I spoke to the patient regarding this he does drink a significant amount of beer approximately 6 or so beers per day and states he has been eating very little over the last few days.  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  We will start the  patient on normal saline infusion and limit free water intake.  Will admit to the hospital service for further workup and treatment.  FINAL CLINICAL IMPRESSION(S) / ED DIAGNOSES   Dyspnea COPD exacerbation Beer Potomania  Note:  This document was prepared using Dragon voice recognition software and may include unintentional dictation errors.   Ruth Cove, MD 03/03/24 [phone]

## 2024-03-03 NOTE — ED Notes (Signed)
 Pt is having difficulty breathing while eating their lunch. Pt's O2 was turned up to 5L [place] and educated to take several breaths in-between bites to help catch up in-between bites to see if that would help.

## 2024-03-03 NOTE — Assessment & Plan Note (Signed)
 Patient regular 40+ ounce multiple beer intake daily EtOH level pending Start CIWA protocol Monitor

## 2024-03-03 NOTE — Discharge Instructions (Addendum)
 Intensive Outpatient Programs  [facility]    [facility] [address]     [address] [place],  [place]     [place], [place] [phone]      [phone]  [facility]   [facility]  (Inpatient and outpatient)  [phone] (Suboxone and Methadone) [address]           [phone]           [facility]    [facility] - Intensive Outpatient [address]     [address] [place], [place] [address]     [place], [place]  [phone]      [phone]  [facility] (Outpatient, Inpatient, [facility]  [facility] (Groups and Residental) (insurance only) [phone]    [place], [place]          [phone]       [facility]    [facility] (for caregivers and family) [address]     [address] [place], [place]     [place], [place] [phone]      [phone]  Residential Treatment Programs  [facility]  [facility] [place](2 years) Residential: 90 days)  [facility] ([facility] [address]      [address] [place], [place]     [place], [place] [phone]      [phone] or [phone]  [facility]    [facility] [address]      [address] [place], [place]     [place], [place] [phone]      [phone]  [facility]   [facility] ([facility]) [address]     [address] [place], [place] [address]     [place], [place] [phone]      [phone] Admissions: 8am-3pm M-F  [facility]: Residential Program [phone] Days)              [facility]: [facility]  [place], [place]     [place], [place]  [phone] or [phone]    (Walk in Hours over the weekend or by referral)   Crisis Mobile: [facility]:[phone] (for crisis response  24 hours a day)    Some PCP options in [place] area- not a comprehensive list  [facility]- [phone] [facility]- [phone] [facility]- [phone] [facility]- [phone] [facility]- [phone] [facility]- [phone]  or [facility] Health Physician Referral Line [phone]    Preferred Primary Care referral pending.  Will call patient with appointment.   [facility] Grow (Provider) Address- [address] [place], [place] [address] Phone number-[phone]   [facility]  They will call you to schedule when they will come out to see you for nursing and Physical Therapy Home health care service [address]  [phone] Open ? Closes 5?PM

## 2024-03-03 NOTE — Assessment & Plan Note (Signed)
 Baseline COPD Does not appear to be in acute exacerbation though with noted concomitant volume overload Continue home inhalers Monitor

## 2024-03-03 NOTE — Assessment & Plan Note (Signed)
 BP stable Titrate home regimen

## 2024-03-03 NOTE — Assessment & Plan Note (Signed)
 Trop 60s on presentation  No active chest pain  EKG stable  Suspect minimal demand ischemia in setting of acute on chronic resp failure, volume overload  Baby ASA  Monitor

## 2024-03-04 ENCOUNTER — Observation Stay (HOSPITAL_COMMUNITY)
Admit: 2024-03-04 | Discharge: 2024-03-04 | Disposition: A | Discharge status: Home / Self Care | Attending: Family Medicine | Admitting: Family Medicine

## 2024-03-04 DIAGNOSIS — E43 Unspecified severe protein-calorie malnutrition: Secondary | ICD-10-CM | POA: Diagnosis present

## 2024-03-04 DIAGNOSIS — Z604 Social exclusion and rejection: Secondary | ICD-10-CM | POA: Diagnosis present

## 2024-03-04 DIAGNOSIS — I5A Non-ischemic myocardial injury (non-traumatic): Secondary | ICD-10-CM | POA: Diagnosis present

## 2024-03-04 DIAGNOSIS — M7989 Other specified soft tissue disorders: Secondary | ICD-10-CM | POA: Diagnosis present

## 2024-03-04 DIAGNOSIS — Z6823 Body mass index (BMI) 23.0-23.9, adult: Secondary | ICD-10-CM | POA: Diagnosis not present

## 2024-03-04 DIAGNOSIS — F17210 Nicotine dependence, cigarettes, uncomplicated: Secondary | ICD-10-CM | POA: Diagnosis present

## 2024-03-04 DIAGNOSIS — E876 Hypokalemia: Secondary | ICD-10-CM | POA: Diagnosis not present

## 2024-03-04 DIAGNOSIS — I2489 Other forms of acute ischemic heart disease: Secondary | ICD-10-CM | POA: Diagnosis present

## 2024-03-04 DIAGNOSIS — Z8249 Family history of ischemic heart disease and other diseases of the circulatory system: Secondary | ICD-10-CM | POA: Diagnosis not present

## 2024-03-04 DIAGNOSIS — F1010 Alcohol abuse, uncomplicated: Secondary | ICD-10-CM | POA: Diagnosis present

## 2024-03-04 DIAGNOSIS — I509 Heart failure, unspecified: Secondary | ICD-10-CM

## 2024-03-04 DIAGNOSIS — I4510 Unspecified right bundle-branch block: Secondary | ICD-10-CM | POA: Diagnosis present

## 2024-03-04 DIAGNOSIS — I110 Hypertensive heart disease with heart failure: Secondary | ICD-10-CM | POA: Diagnosis present

## 2024-03-04 DIAGNOSIS — I5023 Acute on chronic systolic (congestive) heart failure: Secondary | ICD-10-CM | POA: Diagnosis present

## 2024-03-04 DIAGNOSIS — J441 Chronic obstructive pulmonary disease with (acute) exacerbation: Secondary | ICD-10-CM | POA: Diagnosis present

## 2024-03-04 DIAGNOSIS — Z9981 Dependence on supplemental oxygen: Secondary | ICD-10-CM | POA: Diagnosis not present

## 2024-03-04 DIAGNOSIS — Z8673 Personal history of transient ischemic attack (TIA), and cerebral infarction without residual deficits: Secondary | ICD-10-CM | POA: Diagnosis not present

## 2024-03-04 DIAGNOSIS — J9621 Acute and chronic respiratory failure with hypoxia: Secondary | ICD-10-CM | POA: Diagnosis present

## 2024-03-04 DIAGNOSIS — Z91199 Patient's noncompliance with other medical treatment and regimen due to unspecified reason: Secondary | ICD-10-CM | POA: Diagnosis not present

## 2024-03-04 DIAGNOSIS — Z1152 Encounter for screening for COVID-19: Secondary | ICD-10-CM | POA: Diagnosis not present

## 2024-03-04 DIAGNOSIS — R251 Tremor, unspecified: Secondary | ICD-10-CM | POA: Diagnosis not present

## 2024-03-04 DIAGNOSIS — E871 Hypo-osmolality and hyponatremia: Secondary | ICD-10-CM | POA: Diagnosis present

## 2024-03-04 DIAGNOSIS — R002 Palpitations: Secondary | ICD-10-CM | POA: Diagnosis not present

## 2024-03-04 LAB — ECHOCARDIOGRAM COMPLETE
Height: 68 in
S' Lateral: 3 cm
Weight: 2465.62 [oz_av]

## 2024-03-04 LAB — SODIUM
Sodium: 118 mmol/L — CL (ref 135–145)
Sodium: 122 mmol/L — ABNORMAL LOW (ref 135–145)
Sodium: 121 mmol/L — ABNORMAL LOW (ref 135–145)
Sodium: 123 mmol/L — ABNORMAL LOW (ref 135–145)

## 2024-03-04 LAB — CBC
HCT: 39.8 % (ref 39.0–52.0)
Hemoglobin: 14.1 g/dL (ref 13.0–17.0)
MCH: 31 pg (ref 26.0–34.0)
MCHC: 35.4 g/dL (ref 30.0–36.0)
MCV: 87.5 fL (ref 80.0–100.0)
Platelets: 663 10*3/uL — ABNORMAL HIGH (ref 150–400)
RBC: 4.55 MIL/uL (ref 4.22–5.81)
RDW: 13.2 % (ref 11.5–15.5)
WBC: 8.3 10*3/uL (ref 4.0–10.5)
nRBC: 0 % (ref 0.0–0.2)

## 2024-03-04 LAB — BASIC METABOLIC PANEL WITH GFR
Anion gap: 10 (ref 5–15)
BUN: 15 mg/dL (ref 8–23)
CO2: 27 mmol/L (ref 22–32)
Calcium: 8.2 mg/dL — ABNORMAL LOW (ref 8.9–10.3)
Chloride: 84 mmol/L — ABNORMAL LOW (ref 98–111)
Creatinine, Ser: 0.75 mg/dL (ref 0.61–1.24)
GFR, Estimated: >60 mL/min (ref >60)
Glucose, Bld: 141 mg/dL — ABNORMAL HIGH (ref 70–99)
Potassium: 4 mmol/L (ref 3.5–5.1)
Sodium: 121 mmol/L — ABNORMAL LOW (ref 135–145)

## 2024-03-04 LAB — OSMOLALITY, URINE: Osmolality, Ur: 336 mosm/kg (ref 300–900)

## 2024-03-04 LAB — SODIUM, URINE, RANDOM: Sodium, Ur: <10 mmol/L

## 2024-03-04 LAB — GLUCOSE, CAPILLARY: Glucose-Capillary: 158 mg/dL — ABNORMAL HIGH (ref 70–99)

## 2024-03-04 LAB — MRSA NEXT GEN BY PCR, NASAL: MRSA by PCR Next Gen: NOT DETECTED

## 2024-03-04 LAB — OSMOLALITY: Osmolality: 253 mosm/kg — ABNORMAL LOW (ref 275–295)

## 2024-03-04 MED ORDER — SODIUM CHLORIDE 3 % IV SOLN
INTRAVENOUS | Status: DC
  Filled 2024-03-04 (×5): qty 500

## 2024-03-04 MED ORDER — NICOTINE 21 MG/24HR TD PT24
21.0000 mg | MEDICATED_PATCH | Freq: Every day | TRANSDERMAL | Status: DC
  Administered 2024-03-04 – 2024-03-11 (×8): 21 mg via TRANSDERMAL
  Filled 2024-03-04 (×8): qty 1

## 2024-03-04 MED ORDER — THIAMINE HCL 100 MG/ML IJ SOLN
500.0000 mg | Freq: Three times a day (TID) | INTRAVENOUS | Status: DC
  Filled 2024-03-04: qty 5

## 2024-03-04 MED ORDER — IPRATROPIUM-ALBUTEROL 0.5-2.5 (3) MG/3ML IN SOLN
3.0000 mL | Freq: Two times a day (BID) | RESPIRATORY_TRACT | Status: DC
  Administered 2024-03-04 – 2024-03-05 (×2): 3 mL via RESPIRATORY_TRACT
  Filled 2024-03-04 (×2): qty 3

## 2024-03-04 MED ORDER — ENSURE ENLIVE PO LIQD
237.0000 mL | Freq: Three times a day (TID) | ORAL | Status: DC
  Administered 2024-03-04 – 2024-03-10 (×14): 237 mL via ORAL

## 2024-03-04 MED ORDER — THIAMINE HCL 100 MG/ML IJ SOLN
500.0000 mg | Freq: Three times a day (TID) | INTRAMUSCULAR | Status: AC
  Administered 2024-03-04 – 2024-03-05 (×6): 500 mg via INTRAVENOUS
  Filled 2024-03-04 (×6): qty 5

## 2024-03-04 MED ORDER — CHLORHEXIDINE GLUCONATE CLOTH 2 % EX PADS
6.0000 | MEDICATED_PAD | Freq: Every day | CUTANEOUS | Status: DC
Start: 2024-03-04 — End: 2024-03-11
  Administered 2024-03-04 – 2024-03-10 (×6): 6 via TOPICAL

## 2024-03-04 MED ORDER — METOPROLOL SUCCINATE ER 25 MG PO TB24
12.5000 mg | ORAL_TABLET | Freq: Every day | ORAL | Status: DC
  Administered 2024-03-04 – 2024-03-06 (×3): 12.5 mg via ORAL
  Filled 2024-03-04 (×3): qty 0.5

## 2024-03-04 MED ORDER — THIAMINE HCL 100 MG/ML IJ SOLN: 100.0000 mg | INTRAMUSCULAR | Status: DC

## 2024-03-04 MED ORDER — CLONIDINE HCL 0.1 MG PO TABS
0.1000 mg | ORAL_TABLET | Freq: Once | ORAL | Status: AC
  Administered 2024-03-04: 0.1 mg via ORAL
  Filled 2024-03-04: qty 1

## 2024-03-04 MED ORDER — THIAMINE HCL 100 MG/ML IJ SOLN
250.0000 mg | Freq: Three times a day (TID) | INTRAVENOUS | Status: DC
  Administered 2024-03-06 (×3): 250 mg via INTRAVENOUS
  Filled 2024-03-04 (×3): qty 2.5

## 2024-03-04 NOTE — Consult Note (Signed)
 [facility] Consult Note: 03/04/24    Date of Admission:  03/03/2024           Reason for Consult:  Hyponatremia   Referring Provider: Read Camel, MD Primary Care Provider: Pcp, No   History of Presenting Illness:  Michael Padilla is a 68 y.o. male With history of alcohol abuse, COPD, arthritis, history of stroke, hypertension, nutritional deficiencies including vitamin D , B12.  Patient presented to the hospital for acute on chronic respiratory failure, hypoxia, volume overload, acute on chronic hyponatremia and alcohol abuse.  All information is obtained from the chart as patient is very somnolent.  Per chart patient has a history of heavy alcohol abuse.  He is chronically on 2 L nasal cannula at home.  Upon admission patient was noted to have a sodium of 116.  His baseline sodium appears to be 126-129 from 2024 April.  He was placed on 3% hypertonic solution.  Sodium has improved to 122 at around noon today.   Review of Systems: ROS-unavailable due to patient being very somnolent.  Past Medical History:  Diagnosis Date   Arthritis    COPD (chronic obstructive pulmonary disease) (HCC)     Social History   Tobacco Use   Smoking status: Every Day    Current packs/day: 2.00    Types: Cigarettes   Smokeless tobacco: Never  Substance Use Topics   Alcohol use: Yes    Alcohol/week: 29.0 standard drinks of alcohol    Types: 28 Cans of beer, 1 Standard drinks or equivalent per week   Drug use: No    Family History  Problem Relation Age of Onset   Hypertension Mother      OBJECTIVE: Blood pressure 119/83, pulse (!) 112, temperature 98.8 F (37.1 C), resp. rate (!) 22, height 5\' 8"  (1.727 m), weight 69.9 kg, SpO2 96%.  Physical Exam General Appearance-chronically ill-appearing, laying in the bed HEENT-moist oral mucous membranes,  Pulmonary-breathing through the mouth, [place] O2 in place, coarse breath sounds Cardiac-irregular,  tachycardic Abdomen-soft, nontender Extremities-dependent edema is present Neuro-quite somnolent Skin-warm, dry   Lab Results Lab Results  Component Value Date   WBC 8.3 03/04/2024   HGB 14.1 03/04/2024   HCT 39.8 03/04/2024   MCV 87.5 03/04/2024   PLT 663 (H) 03/04/2024    Lab Results  Component Value Date   CREATININE 0.75 03/04/2024   BUN 15 03/04/2024   NA 122 (L) 03/04/2024   K 4.0 03/04/2024   CL 84 (L) 03/04/2024   CO2 27 03/04/2024    Lab Results  Component Value Date   ALT 27 03/03/2024   AST 48 (H) 03/03/2024   ALKPHOS 88 03/03/2024   BILITOT 1.7 (H) 03/03/2024     Microbiology: Recent Results (from the past 240 hours)  Resp panel by RT-PCR (RSV, Flu A&B, Covid) Anterior Nasal Swab     Status: None   Collection Time: 03/03/24  8:27 AM   Specimen: Anterior Nasal Swab  Result Value Ref Range Status   SARS Coronavirus 2 by RT PCR NEGATIVE NEGATIVE Final    Comment: (NOTE) SARS-CoV-2 target nucleic acids are NOT DETECTED.  The SARS-CoV-2 RNA is generally detectable in upper respiratory specimens during the acute phase of infection. The lowest concentration of SARS-CoV-2 viral copies this assay can detect is 138 copies/mL. A negative result does not preclude SARS-Cov-2 infection and should not be used as the sole basis for treatment or other patient management decisions. A negative result may occur with  improper specimen collection/handling, submission of specimen other than nasopharyngeal swab, presence of viral mutation(s) within the areas targeted by this assay, and inadequate number of viral copies(<138 copies/mL). A negative result must be combined with clinical observations, patient history, and epidemiological information. The expected result is Negative.  Fact Sheet for Patients:  BloggerCourse.com  Fact Sheet for Healthcare Providers:  SeriousBroker.it  This test is no t yet approved or  cleared by the [place]  FDA and  has been authorized for detection and/or diagnosis of SARS-CoV-2 by FDA under an Emergency Use Authorization (EUA). This EUA will remain  in effect (meaning this test can be used) for the duration of the COVID-19 declaration under Section 564(b)(1) of the Act, 21 U.S.C.section 360bbb-3(b)(1), unless the authorization is terminated  or revoked sooner.       Influenza A by PCR NEGATIVE NEGATIVE Final   Influenza B by PCR NEGATIVE NEGATIVE Final    Comment: (NOTE) The Xpert Xpress SARS-CoV-2/FLU/RSV plus assay is intended as an aid in the diagnosis of influenza from Nasopharyngeal swab specimens and should not be used as a sole basis for treatment. Nasal washings and aspirates are unacceptable for Xpert Xpress SARS-CoV-2/FLU/RSV testing.  Fact Sheet for Patients: BloggerCourse.com  Fact Sheet for Healthcare Providers: SeriousBroker.it  This test is not yet approved or cleared by the [place]  FDA and has been authorized for detection and/or diagnosis of SARS-CoV-2 by FDA under an Emergency Use Authorization (EUA). This EUA will remain in effect (meaning this test can be used) for the duration of the COVID-19 declaration under Section 564(b)(1) of the Act, 21 U.S.C. section 360bbb-3(b)(1), unless the authorization is terminated or revoked.     Resp Syncytial Virus by PCR NEGATIVE NEGATIVE Final    Comment: (NOTE) Fact Sheet for Patients: BloggerCourse.com  Fact Sheet for Healthcare Providers: SeriousBroker.it  This test is not yet approved or cleared by the [place]  FDA and has been authorized for detection and/or diagnosis of SARS-CoV-2 by FDA under an Emergency Use Authorization (EUA). This EUA will remain in effect (meaning this test can be used) for the duration of the COVID-19 declaration under Section 564(b)(1) of the Act, 21  U.S.C. section 360bbb-3(b)(1), unless the authorization is terminated or revoked.  Performed at [facility], [address] [place], [place] [address]   MRSA Next Gen by PCR, Nasal     Status: None   Collection Time: 03/04/24  6:40 AM   Specimen: Nasal Mucosa; Nasal Swab  Result Value Ref Range Status   MRSA by PCR Next Gen NOT DETECTED NOT DETECTED Final    Comment: (NOTE) The GeneXpert MRSA Assay (FDA approved for NASAL specimens only), is one component of a comprehensive MRSA colonization surveillance program. It is not intended to diagnose MRSA infection nor to guide or monitor treatment for MRSA infections. Test performance is not FDA approved in patients less than 72 years old. Performed at [facility], [address] [place], [place] [address]     Medications: Scheduled Meds:  azithromycin   500 mg Oral Daily   Chlorhexidine  Gluconate Cloth  6 each Topical Daily   enoxaparin  (LOVENOX ) injection  40 mg Subcutaneous Q24H   feeding supplement  237 mL Oral TID BM   folic acid   1 mg Oral Daily   furosemide   40 mg Intravenous Q12H   ipratropium-albuterol   3 mL Nebulization BID   lisinopril  2.5 mg Oral Daily   metoprolol  succinate  12.5 mg Oral Daily   multivitamin with  minerals  1 tablet Oral Daily   nicotine   21 mg Transdermal Daily   [START ON 03/05/2024] predniSONE   40 mg Oral Q breakfast   sodium chloride  flush  3 mL Intravenous Q12H   [START ON 03/07/2024] thiamine  (VITAMIN B1) injection  100 mg Intravenous Q24H   Continuous Infusions:  sodium chloride  (hypertonic) 30 mL/hr at 03/04/24 0716   thiamine  (VITAMIN B1) injection 500 mg (03/04/24 1614)   Followed by   Cecily Cohen ON 03/06/2024] thiamine  (VITAMIN B1) injection     PRN Meds:.acetaminophen , albuterol , LORazepam  **OR** LORazepam , ondansetron  **OR** ondansetron  (ZOFRAN ) IV, sodium chloride  flush  No Known Allergies  Urinalysis: No results for input(s): "COLORURINE", "LABSPEC", "PHURINE",  "GLUCOSEU", "HGBUR", "BILIRUBINUR", "KETONESUR", "PROTEINUR", "UROBILINOGEN", "NITRITE", "LEUKOCYTESUR" in the last 72 hours.  Invalid input(s): "APPERANCEUR"    Imaging: ECHOCARDIOGRAM COMPLETE Result Date: 03/04/2024    ECHOCARDIOGRAM REPORT   Patient Name:   MAKYI ZORNES Date of Exam: 03/04/2024 Medical Rec #:  [id number]        Height:       68.0 in Accession #:    [id number]       Weight:       154.1 lb Date of Birth:  1956/03/20        BSA:          1.829 m Patient Age:    68 years         BP:           124/88 mmHg Patient Gender: M                HR:           107 bpm. Exam Location:  [facility] Procedure: 2D Echo, Color Doppler and Cardiac Doppler (Both Spectral and Color            Flow Doppler were utilized during procedure). Indications:     Congestive heart failure I50.9  History:         Patient has prior history of Echocardiogram examinations, most                  recent 01/03/2022. COPD.  Sonographer:     Broadus Canes Referring Phys:  [phone] STEVEN J NEWTON Diagnosing Phys: Lanell Pinta Custovic  Sonographer Comments: Technically challenging study due to limited acoustic windows, no apical window and no subcostal window. Image acquisition challenging due to COPD. IMPRESSIONS  1. Left ventricular ejection fraction, by estimation, is 45 to 50%. The left ventricle has mildly decreased function. The left ventricle demonstrates regional wall motion abnormalities (see scoring diagram/findings for description). Left ventricular diastolic parameters are indeterminate.  2. Right ventricular systolic function is normal. The right ventricular size is normal.  3. The mitral valve is normal in structure. Mild mitral valve regurgitation. No evidence of mitral stenosis.  4. The aortic valve is normal in structure. Aortic valve regurgitation is not visualized. No aortic stenosis is present.  5. The inferior vena cava is normal in size with greater than 50% respiratory variability, suggesting right atrial pressure of 3 mmHg.  FINDINGS  Left Ventricle: Left ventricular ejection fraction, by estimation, is 45 to 50%. The left ventricle has mildly decreased function. The left ventricle demonstrates regional wall motion abnormalities. The left ventricular internal cavity size was normal in size. There is no left ventricular hypertrophy. Left ventricular diastolic parameters are indeterminate.  LV Wall Scoring: The apical anterior segment, apical inferior segment, and apex are hypokinetic. Right Ventricle: The right ventricular size is normal. No  increase in right ventricular wall thickness. Right ventricular systolic function is normal. Left Atrium: Left atrial size was normal in size. Right Atrium: Right atrial size was normal in size. Pericardium: There is no evidence of pericardial effusion. Mitral Valve: The mitral valve is normal in structure. Mild mitral valve regurgitation. No evidence of mitral valve stenosis. Tricuspid Valve: The tricuspid valve is normal in structure. Tricuspid valve regurgitation is trivial. Aortic Valve: The aortic valve is normal in structure. Aortic valve regurgitation is not visualized. No aortic stenosis is present. Pulmonic Valve: The pulmonic valve was normal in structure. Pulmonic valve regurgitation is not visualized. Aorta: The aortic root is normal in size and structure. Venous: The inferior vena cava is normal in size with greater than 50% respiratory variability, suggesting right atrial pressure of 3 mmHg. IAS/Shunts: No atrial level shunt detected by color flow Doppler.  LEFT VENTRICLE PLAX 2D LVIDd:         4.00 cm LVIDs:         3.00 cm LV PW:         1.00 cm LV IVS:        1.40 cm LVOT diam:     2.20 cm LVOT Area:     3.80 cm  LEFT ATRIUM         Index LA diam:    2.40 cm 1.31 cm/m   AORTA Ao Root diam: 3.75 cm  SHUNTS Systemic Diam: 2.20 cm Lanell Pinta Custovic Electronically signed by Isabell Manzanilla Signature Date/Time: 03/04/2024/12:34:08 PM    Final    DG Chest Portable 1 View Result Date:  03/03/2024 CLINICAL DATA:  Shortness of breath. EXAM: PORTABLE CHEST 1 VIEW COMPARISON:  02/15/2023 FINDINGS: Stable cardiomediastinal contours. Aortic atherosclerosis. Mild interstitial edema and small pleural effusions identified. No airspace consolidation. Visualized osseous structures are unremarkable. IMPRESSION: Mild interstitial edema and small pleural effusions. Correlate for signs/symptoms of congestive heart failure. Electronically Signed   By: Kimberley Penman M.D.   On: 03/03/2024 08:55      Assessment/Plan:  Michael Padilla is a 68 y.o. male with medical problems of  alcohol abuse, COPD, arthritis, history of stroke, hypertension, nutritional deficiencies including vitamin D , B12. was admitted on 03/03/2024 for :  Hyponatremia [E87.1] Dyspnea, unspecified type [R06.00]  #Hyponatremia Likely multifactorial.  Patient has underlying COPD and Hx of etoh abuse which may be the cause of chronic hyponatremia. Acute worsening could be secondary to alcohol abuse and volume overload. Plan: Agree with 3% hypertonic solution at the current rate until sodium improves to close to 130. Also agree with IV furosemide  to optimize volume status. Currently is getting furosemide  40 mg IV twice a day. Monitor urine output closely. Goal forCorrection of sodium is about 8 mEq in 24 hours.  Neville Walston Zelda Hickman 03/04/24

## 2024-03-04 NOTE — Progress Notes (Signed)
 Progress Note   Patient: Michael Padilla UEA:[id number] DOB: [birth year] DOA: 03/03/2024     0 DOS: the patient was seen and examined on 03/04/2024   Brief hospital course:  Michael Padilla is a 68 y.o. male with medical history significant of Etoh abuse , COPD , arthritis, hx of CVA, essential hypertension, vit d def, B12 def presenting with acute on chronic respiratory failure hypoxia, volume overload, acute on chronic hyponatremia, alcohol abuse.  History from patient as well as the son at the bedside.  Per report, patient with increased work of breathing and shortness of breath the past 4 to 5 days. History of heavy alcohol use including at least 4 to 540 ounce beers daily.  Also smoking 1 to 2 packs/day.  Patient is supposed to wear 2 L nasal cannula at home chronically in setting of chronic respiratory failure.  However patient has been noncompliant for several months.  Positive orthopnea, PND.  Positive cough and trace wheezing.  Positive lower extremity swelling.  No fevers or chills.  Positive abdominal swelling.  No focal hemiparesis or confusion. Presented to the ER afebrile, heart rate 100s, BP stable.  Satting mid 90s on room air.  White count 8.7, hemoglobin 15.4, platelets 729, troponin in the 60s.  Sodium 116.  COVID flu and RSV negative.  Creatinine 0.46.  BNP 1148.  Chest x-ray with interstitial edema and pleural effusions.   Assessment and Plan:   Hyponatremia Noted to have a sodium level of 116 on admission and initially thought to be secondary to volume overload from acute CHF as well as beer potomania No improvement in his sodium levels with IV diuresis and is currently on 3% saline Appreciate nephrology input, will continue 3% saline and IV diuresis    Acute on chronic respiratory failure with hypoxia (HCC) Secondary to acute CHF (unclear etiology, echo pending) Remains on 5 L nasal cannula Normally on 2 L chronically but noncompliant Will attempt to wean oxygen  down  once acute illness improves or resolves. Patient may have a new baseline   Acute systolic CHF  Concern for possible wet BeriBeri in a patient with history of alcohol abuse Patient with worsening shortness of breath from his baseline associated with increased oxygen  requirement Imaging shows mild interstitial edema and small pleural effusions.  2D echocardiogram shows an LVEF of 45 to 50% with decreased LV systolic function Appreciate cardiology input  Continue Lasix  40 mg IV twice daily Place patient on high-dose thiamine     Alcohol abuse At risk for alcohol withdrawal Noted to be tachycardic with mild tremors Will give a dose of clonidine 0.1 mg Continue CIWA protocol and administer lorazepam  for CIWA score of 8 or greater    Elevated troponin Trop 60s on presentation  No active chest pain  EKG stable  Suspect demand ischemia in setting of acute on chronic resp failure and acute CHF    COPD (chronic obstructive pulmonary disease) (HCC) with acute exacerbation Continue systemic and inhaled steroids Continue bronchodilator therapy    Essential hypertension BP stable Continue metoprolol  and lisinopril    Generalized weakness Chronic generalized weakness in setting of heavy regular alcohol use and significant electrolyte abnormalities Nonfocal neuroexam Monitor     Severe malnutrition In the context of chronic illness Dietitian recommends  Ensure Enlive po TID, each supplement provides 350 kcal and 20 grams of protein. MVI, thiamine  and folic acid  po daily  Recommend 500 mg of thiamine  intravenously, infused over 30 minutes, three times daily for two  consecutive days and 250 mg intravenously once daily for an additional five days Liberalize diet  Pt at high refeed risk; recommend monitor potassium, magnesium and phosphorus labs daily until stable Daily weights  Check B12 level     Subjective: Complains of palpitations.  Physical Exam: Vitals:   03/04/24 1030  03/04/24 1100 03/04/24 1130 03/04/24 1200  BP: 121/82 136/83 121/78 100/63  Pulse: (!) 126 (!) 109 (!) 105 (!) 107  Resp: (!) 25 (!) 25 (!) 29 (!) 26  Temp:      TempSrc:      SpO2: 91% 94% 94% 96%  Weight:      Height:       Comments: Chronically ill-appearing HENT:     Head: Normocephalic and atraumatic.     Mouth/Throat:     Mouth: Mucous membranes are moist.  Eyes:     Pupils: Pupils are equal, round, and reactive to light.  Cardiovascular:     Rate and Rhythm: Tachycardic Pulmonary:     Effort: Scattered expiratory wheezes Abdominal:     General: Bowel sounds are normal.  Musculoskeletal:        General: Normal range of motion.  Skin:    General: Skin is dry.  Neurological:     General: No focal deficit present.  Psychiatric:        Mood and Affect: Mood normal.       Data Reviewed: Sodium 122, potassium 4.0, Labs reviewed  Family Communication: Plan of care was discussed with patient in detail.  He verbalizes understanding and agrees with the plan.  Disposition: Status is: Inpatient Remains inpatient appropriate because: On 3% saline for severe hyponatremia  Planned Discharge Destination: TBD    Time spent: 40 minutes  Author: Read Camel, MD 03/04/2024 1:41 PM  For on call review [email or web address].

## 2024-03-04 NOTE — Progress Notes (Signed)
 Initial Nutrition Assessment  DOCUMENTATION CODES:   Severe malnutrition in context of chronic illness  INTERVENTION:   Ensure Enlive po TID, each supplement provides 350 kcal and 20 grams of protein.  MVI, thiamine  and folic acid  po daily   Recommend 500 mg of thiamine  intravenously, infused over 30 minutes, three times daily for two consecutive days and 250 mg intravenously once daily for an additional five days  Liberalize diet   Pt at high refeed risk; recommend monitor potassium, magnesium and phosphorus labs daily until stable  Daily weights   Check B12 level  NUTRITION DIAGNOSIS:   Severe Malnutrition related to chronic illness (COPD, etoh abuse) as evidenced by severe fat depletion, severe muscle depletion.  GOAL:   Patient will meet greater than or equal to 90% of their needs  MONITOR:   PO intake, Supplement acceptance, Labs, Weight trends, I & O's, Skin  REASON FOR ASSESSMENT:   Consult Assessment of nutrition requirement/status  ASSESSMENT:   68 y/o male with h/o chronic hyponatremia, etoh abuse, HTN, COPD and CVA who is admitted with volume overload and suspected heart failure.  Met with pt in room today. Pt's main complaint is being tired. Pt reports good appetite and oral intake at baseline. Pt reports that he ate well at breakfast this morning. Pt reports that he does not drink any supplements at home but reports that he is willing to drink vanilla Ensure in hospital. Pt is at high refeed risk. RD discussed with pt the importance of adequate nutrition needed to preserve lean muscle. Recommend high dose IV thiamine  as pt is at high risk for deficiency (wet beriberi). There is no recent documented weight history in chart to determine if any significant changes. Pt reports that he has always been thin and denies any recent weight loss.    Medications reviewed and include: azithromycin ,  Lovenox , folic acid , lasix , prednisone , MVI, thiamine   Labs reviewed:  Na 121(L), K 4.0 wnl BNP- 1147.7(H)- 5/11   NUTRITION - FOCUSED PHYSICAL EXAM:  Flowsheet Row Most Recent Value  Orbital Region Moderate depletion  Upper Arm Region Severe depletion  Thoracic and Lumbar Region Severe depletion  Buccal Region Moderate depletion  Temple Region Moderate depletion  Clavicle Bone Region Severe depletion  Clavicle and Acromion Bone Region Severe depletion  Scapular Bone Region Severe depletion  Dorsal Hand Moderate depletion  Patellar Region Unable to assess  Anterior Thigh Region Unable to assess  Posterior Calf Region Unable to assess  Edema (RD Assessment) Moderate  Hair Reviewed  Eyes Reviewed  Mouth Reviewed  Skin Reviewed  Nails Reviewed   Diet Order:   Diet Order             Diet heart healthy/carb modified Room service appropriate? Yes; Fluid consistency: Thin; Fluid restriction: 1200 mL Fluid  Diet effective now                  EDUCATION NEEDS:   Education needs have been addressed  Skin:  Skin Assessment: Reviewed RN Assessment  Last BM:  5/11  Height:   Ht Readings from Last 1 Encounters:  03/03/24 5\' 8"  (1.727 m)    Weight:   Wt Readings from Last 1 Encounters:  03/04/24 69.9 kg    Ideal Body Weight:  70 kg  BMI:  Body mass index is 23.43 kg/m.  Estimated Nutritional Needs:   Kcal:  1900-2200kcal/day  Protein:  95-110g/day  Fluid:  1.8-2.1L/day  Torrance Freestone MS, RD, LDN If unable to be  reached, please send secure chat to "RD inpatient" available from 8:00a-4:00p daily

## 2024-03-04 NOTE — Care Management Obs Status (Signed)
 MEDICARE OBSERVATION STATUS NOTIFICATION   Patient Details  Name: Michael Padilla MRN: [id number] Date of Birth: Jun 13, 1956   Medicare Observation Status Notification Given:  Yes    Anise Kerns 03/04/2024, 11:26 AM

## 2024-03-04 NOTE — Progress Notes (Incomplete)
 Heart Failure Stewardship Pharmacy Note  PCP: Pcp, No PCP-Cardiologist: None  HPI: Michael Padilla is a 68 y.o. male with alcohol abuse , COPD , arthritis, hx of CVA, essential hypertension, vitamin d  and B12 deficiencies who presented with chronic respiratory failure with hypoxia, volume overload, acute on chronic hyponatremia. On admission, BNP was 1147.7, HS-troponin was 66 > 62, and sodium was 116. Chest x-ray noted mild interstitial edema dn small pleural effusions.    Pertinent cardiac history: Echo in 12/2021 showed LVEF of 55-60%.   Pertinent Lab Values: Creatinine, Ser  Date Value Ref Range Status  03/03/2024 0.46 (L) 0.61 - 1.24 mg/dL Final   BUN  Date Value Ref Range Status  03/03/2024 <5 (L) 8 - 23 mg/dL Final   Potassium  Date Value Ref Range Status  03/03/2024 4.5 3.5 - 5.1 mmol/L Final   Sodium  Date Value Ref Range Status  03/04/2024 118 (LL) 135 - 145 mmol/L Final    Comment:    CRITICAL RESULT CALLED TO, READ BACK BY AND VERIFIED WITH [facility] LPN @ 1610 9/60/45 [facility] Performed at [facility], [address] [place], [place] [address]    B Natriuretic Peptide  Date Value Ref Range Status  03/03/2024 1,147.7 (H) 0.0 - 100.0 pg/mL Final    Comment:    Performed at [facility], [address] [place], [place] [address]   Magnesium  Date Value Ref Range Status  02/16/2023 2.2 1.7 - 2.4 mg/dL Final    Comment:    Performed at [facility], [address] [place], [place] [address]   Hgb A1c MFr Bld  Date Value Ref Range Status  02/15/2023 5.5 4.8 - 5.6 % Final    Comment:    (NOTE)         Prediabetes: 5.7 - 6.4         Diabetes: >6.4         Glycemic control for adults with diabetes: <7.0    TSH  Date Value Ref Range Status  03/03/2024 1.065 0.350 - 4.500 uIU/mL Final    Comment:    Performed by a 3rd Generation assay with a functional sensitivity of <=0.01 uIU/mL. Performed at [facility],  [address] [place], [place] [address]     Vital Signs: Admission weight: Temp:  [97.3 F (36.3 C)-98.8 F (37.1 C)] 98.2 F (36.8 C) (05/12 0645) Pulse Rate:  [51-128] 107 (05/12 0700) Cardiac Rhythm: Atrial fibrillation;Other (Comment) (05/12 0739) Resp:  [16-31] 19 (05/12 0700) BP: (106-160)/(67-114) 124/88 (05/12 0700) SpO2:  [85 %-100 %] 96 % (05/12 0700) Weight:  [65.4 kg (144 lb 2.9 oz)-69.9 kg (154 lb 1.6 oz)] 69.9 kg (154 lb 1.6 oz) (05/12 0514)  Intake/Output Summary (Last 24 hours) at 03/04/2024 0753 Last data filed at 03/04/2024 0716 Gross per 24 hour  Intake 526.78 ml  Output 1350 ml  Net -823.22 ml    Current Heart Failure Medications:  Loop diuretic: Beta-Blocker: ACEI/ARB/ARNI: MRA: SGLT2i: Other:  Prior to admission Heart Failure Medications:  Loop diuretic: Beta-Blocker: ACEI/ARB/ARNI: MRA: SGLT2i: Other:  Assessment: 1. {CHL AMB Acute or Chronic:210917265}  - Plan: 1) Medication changes recommended at this time:  2) Patient assistance:   3) Education: -To be completed prior to discharge.  *** Medication Assistance / Insurance Benefits Check: Does the patient have prescription insurance?    Type of insurance plan:  Does the patient qualify for medication assistance through manufacturers or grants? {CHL AMB Yes/No/Pending:210917269}  Eligible grants and/or patient assistance programs: ***  Medication assistance applications in progress: ***  Medication assistance applications approved: *** Approved medication assistance renewals will be completed by: ***  Outpatient Pharmacy: Prior to admission outpatient pharmacy: ***      ***

## 2024-03-04 NOTE — Progress Notes (Signed)
 PT Cancellation Note  Patient Details Name: Michael Padilla MRN: [id number] DOB: 21-Aug-1956   Cancelled Treatment:    Reason Eval/Treat Not Completed: Patient not medically ready.  Pt chart reviewed, sodium levels 118, per secure chat with MD requested to hold for PT evaluation on this date. Will re-attempt evaluation at later date/time as medically appropriate.    Rozanna Corner, PT, DPT Physical Therapist - [facility]  03/04/24, 11:31 AM

## 2024-03-04 NOTE — Progress Notes (Signed)
 OT Cancellation Note  Patient Details Name: Michael Padilla MRN: [id number] DOB: January 05, 1956   Cancelled Treatment:    Reason Eval/Treat Not Completed: Patient not medically ready. Pt chart reviewed, sodium levels 118, per secure chat with MD requested to hold for OT evaluation on this date. Will re-attempt OT eval when pt medically appropriate.  Rosaria Common M.S. OTR/L  03/04/24, 8:57 AM

## 2024-03-04 NOTE — Progress Notes (Signed)
 [facility] CLINIC CARDIOLOGY PROGRESS NOTE   Patient ID: Michael Padilla MRN: [id number] DOB/AGE: 12/15/1955 68 y.o.  Admit date: 03/03/2024 Referring Physician Dr. Daisey Dryer Primary Physician Pcp, No Primary Cardiologist None Reason for Consultation Acute heart failure  HPI: Michael Padilla is a 68 y.o. male with a past medical history of hypertension, alcohol use, tobacco use, COPD, hx CVA who presented to the ED on 03/03/2024 for worsening shortness of breath for last 5 days associated with pedal edema and orthopnea. Patient denies chest pain or palpitations.  Interval History: -Patient seen and examined this AM and laying comfortably in hospital bed. Patient states his SOB feels the same and denies chest pain or palpitations.  -Patients BP borderline and HR  elevated this AM. Overnight Tele showed no significant events.  -UOP yesterday 1.35 L, with stable Cr and BUN  this AM.  -Hyponatremic at 122 this AM.  -Patient remains on 5L [place] with stable SpO2. Patient reports using O2 at home PRN.  Review of systems complete and found to be negative unless listed above    Vitals:   03/04/24 1030 03/04/24 1100 03/04/24 1130 03/04/24 1200  BP: 121/82 136/83 121/78 100/63  Pulse: (!) 126 (!) 109 (!) 105 (!) 107  Resp: (!) 25 (!) 25 (!) 29 (!) 26  Temp:      TempSrc:      SpO2: 91% 94% 94% 96%  Weight:      Height:         Intake/Output Summary (Last 24 hours) at 03/04/2024 1259 Last data filed at 03/04/2024 4098 Gross per 24 hour  Intake 526.78 ml  Output 450 ml  Net 76.78 ml     PHYSICAL EXAM General: chronically ill appearing elderly male, well nourished, in no acute distress. HEENT: Normocephalic and atraumatic. Neck: No JVD.  Lungs: Normal respiratory effort on 5L [place]. Diminished bilaterally. Heart: HRR, elevated rates. Normal S1 and S2 without gallops or murmurs. Radial & DP pulses 2+ bilaterally. Abdomen: Non-distended appearing.  Msk: Normal strength and tone for  age. Extremities: No clubbing, cyanosis. 1-2+ LE bilaterally pitting edema   Neuro: Alert and oriented X 3. Psych: Mood appropriate, affect congruent.    LABS: Basic Metabolic Panel: Recent Labs    03/03/24 0822 03/03/24 1054 03/04/24 0837 03/04/24 1144  NA 116*   < > 121* 122*  K 4.5  --  4.0  --   CL 80*  --  84*  --   CO2 24  --  27  --   GLUCOSE 78  --  141*  --   BUN <5*  --  15  --   CREATININE 0.46*  --  0.75  --   CALCIUM 8.6*  --  8.2*  --    < > = values in this interval not displayed.   Liver Function Tests: Recent Labs    03/03/24 0822  AST 48*  ALT 27  ALKPHOS 88  BILITOT 1.7*  PROT 7.0  ALBUMIN 3.8   No results for input(s): "LIPASE", "AMYLASE" in the last 72 hours. CBC: Recent Labs    03/03/24 0822 03/04/24 0219  WBC 8.7 8.3  HGB 15.4 14.1  HCT 44.8 39.8  MCV 88.9 87.5  PLT 729* 663*   Cardiac Enzymes: Recent Labs    03/03/24 0822 03/03/24 1054  TROPONINIHS 66* 62*   BNP: Recent Labs    03/03/24 0822  BNP 1,147.7*   D-Dimer: No results for input(s): "DDIMER" in the last 72 hours.  Hemoglobin A1C: No results for input(s): "HGBA1C" in the last 72 hours. Fasting Lipid Panel: No results for input(s): "CHOL", "HDL", "LDLCALC", "TRIG", "CHOLHDL", "LDLDIRECT" in the last 72 hours. Thyroid  Function Tests: Recent Labs    03/03/24 1913  TSH 1.065   Anemia Panel: No results for input(s): "VITAMINB12", "FOLATE", "FERRITIN", "TIBC", "IRON", "RETICCTPCT" in the last 72 hours.  ECHOCARDIOGRAM COMPLETE Result Date: 03/04/2024    ECHOCARDIOGRAM REPORT   Patient Name:   Michael Padilla Date of Exam: 03/04/2024 Medical Rec #:  [id number]        Height:       68.0 in Accession #:    [id number]       Weight:       154.1 lb Date of Birth:  May 16, 1956        BSA:          1.829 m Patient Age:    68 years         BP:           124/88 mmHg Patient Gender: M                HR:           107 bpm. Exam Location:  [facility] Procedure: 2D Echo, Color Doppler and  Cardiac Doppler (Both Spectral and Color            Flow Doppler were utilized during procedure). Indications:     Congestive heart failure I50.9  History:         Patient has prior history of Echocardiogram examinations, most                  recent 01/03/2022. COPD.  Sonographer:     Broadus Canes Referring Phys:  [id number] STEVEN J NEWTON Diagnosing Phys: Michael Padilla  Sonographer Comments: Technically challenging study due to limited acoustic windows, no apical window and no subcostal window. Image acquisition challenging due to COPD. IMPRESSIONS  1. Left ventricular ejection fraction, by estimation, is 45 to 50%. The left ventricle has mildly decreased function. The left ventricle demonstrates regional wall motion abnormalities (see scoring diagram/findings for description). Left ventricular diastolic parameters are indeterminate.  2. Right ventricular systolic function is normal. The right ventricular size is normal.  3. The mitral valve is normal in structure. Mild mitral valve regurgitation. No evidence of mitral stenosis.  4. The aortic valve is normal in structure. Aortic valve regurgitation is not visualized. No aortic stenosis is present.  5. The inferior vena cava is normal in size with greater than 50% respiratory variability, suggesting right atrial pressure of 3 mmHg. FINDINGS  Left Ventricle: Left ventricular ejection fraction, by estimation, is 45 to 50%. The left ventricle has mildly decreased function. The left ventricle demonstrates regional wall motion abnormalities. The left ventricular internal cavity size was normal in size. There is no left ventricular hypertrophy. Left ventricular diastolic parameters are indeterminate.  LV Wall Scoring: The apical anterior segment, apical inferior segment, and apex are hypokinetic. Right Ventricle: The right ventricular size is normal. No increase in right ventricular wall thickness. Right ventricular systolic function is normal. Left Atrium: Left atrial size  was normal in size. Right Atrium: Right atrial size was normal in size. Pericardium: There is no evidence of pericardial effusion. Mitral Valve: The mitral valve is normal in structure. Mild mitral valve regurgitation. No evidence of mitral valve stenosis. Tricuspid Valve: The tricuspid valve is normal in structure. Tricuspid valve regurgitation is trivial. Aortic Valve: The aortic  valve is normal in structure. Aortic valve regurgitation is not visualized. No aortic stenosis is present. Pulmonic Valve: The pulmonic valve was normal in structure. Pulmonic valve regurgitation is not visualized. Aorta: The aortic root is normal in size and structure. Venous: The inferior vena cava is normal in size with greater than 50% respiratory variability, suggesting right atrial pressure of 3 mmHg. IAS/Shunts: No atrial level shunt detected by color flow Doppler.  LEFT VENTRICLE PLAX 2D LVIDd:         4.00 cm LVIDs:         3.00 cm LV PW:         1.00 cm LV IVS:        1.40 cm LVOT diam:     2.20 cm LVOT Area:     3.80 cm  LEFT ATRIUM         Index LA diam:    2.40 cm 1.31 cm/m   AORTA Ao Root diam: 3.75 cm  SHUNTS Systemic Diam: 2.20 cm Michael Padilla Electronically signed by Isabell Manzanilla Signature Date/Time: 03/04/2024/12:34:08 PM    Final    DG Chest Portable 1 View Result Date: 03/03/2024 CLINICAL DATA:  Shortness of breath. EXAM: PORTABLE CHEST 1 VIEW COMPARISON:  02/15/2023 FINDINGS: Stable cardiomediastinal contours. Aortic atherosclerosis. Mild interstitial edema and small pleural effusions identified. No airspace consolidation. Visualized osseous structures are unremarkable. IMPRESSION: Mild interstitial edema and small pleural effusions. Correlate for signs/symptoms of congestive heart failure. Electronically Signed   By: Kimberley Penman M.D.   On: 03/03/2024 08:55     ECHO as above  TELEMETRY reviewed by me 03/04/24: sinus tachycardia, PACs rate 120s  EKG reviewed by me 03/04/24: sinus tachycardia, PACs,  RBBB, rate 140 bpm  DATA reviewed by me 03/04/24: last 24h vitals tele labs imaging I/O hospitalist progress note.  Principal Problem:   Hyponatremia Active Problems:   Generalized weakness   Alcohol abuse   Acute on chronic respiratory failure with hypoxia (HCC)   Essential hypertension   COPD (chronic obstructive pulmonary disease) (HCC)   Elevated troponin    ASSESSMENT AND PLAN: VIR HEEB is a 69 y.o. male with a past medical history of hypertension, alcohol use, tobacco use, COPD, hx CVA who presented to the ED on 03/03/2024 for worsening shortness of breath for last 5 days associated with pedal edema and orthopnea. Patient denies chest pain or palpitations.  # Acute Heart Failure mildly reduced EF # Hyponatremia # COPD  # Significant alcohol and tobacco use Patient presents to ED with worsening dyspnea, lower extremity edema and orthopnea. BNP elevated at 1147. EKG in ED with sinus tachycardia with PACs, RBBB rate 140 bpm. 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 BP borderline and HR elevated. UOP yesterday 1.35 L, with stable Cr and BUN  this AM. Na improving, 122 this AM. -Continue IV lasix  40 mg twice daily. Continue to monitor UOP and renal function closley.  -Keep K > 4 and Mg >2  -Continue lisinopril 2.5 mg daily.  -Start low dose metoprolol  succinate 12.5 mg daily. -Consider optimizing GDMT if BP and renal function allow. Can also initiate outpatient.  -Plan for ischemic evaluation outpatient to evaluate new RWMA found on echo. -Hyponatremia and COPD management per primary.   This patient's case was discussed and created with Dr. Custovic and she is in agreement.  Signed:  Creighton Doffing, PA-C  03/04/2024, 12:59 PM [facility] Cardiology

## 2024-03-04 NOTE — Progress Notes (Signed)
       CROSS COVER NOTE  NAME: Michael Padilla MRN: [id number] DOB : 1956/09/22    Concern as stated by nurse / staff   hyponatremia     Pertinent findings on chart review: H&P reviewed: Patient admitted with CHF with hyponatremia of 116 on admission, baseline 129      Latest Ref Rng & Units 03/04/2024    2:19 AM 03/03/2024   10:55 PM 03/03/2024    7:13 PM  BMP  Sodium 135 - 145 mmol/L 118  119  118      Assessment and  Interventions   Assessment:  Sodium not improving with the diuresis only as expected  Plan: Will start hypertonic saline at a low rate on 30 ml/hr in view of admission for CHF exacerbation Hypertonic saline protocol ordered-discussed with pharmacist  Nephrology consulted

## 2024-03-04 NOTE — Progress Notes (Signed)
 MEDICATION RELATED CONSULT NOTE  Pharmacy Consult for 3% hypertonic saline Indication: hyponatremia  Labs: Sodium (mmol/L)  Date Value  03/04/2024 118 (LL)   Assessment: Baseline Na+: 118  Goal of Therapy:  Increase in Na by 4-6 mEq/L in 4-6 hours Do not exceed increase in Na by 10-12 mEq/L in 24 hours   Monitoring:  Date Time Na Rate/Comment     Plan:  Hypertonic saline started at 30 ml/hr  Check Na Q2H x2 then Q4H   Call RN to stop infusion and notify MD if: Na increases > 4 mEq/L in first 2 hours Na increases > 6 mEq/L in first 4 hours Na increases > 6 mEq/L in 6 hours  Na increases > 8 mEq/L in 8 hours (give D5W bolus) Continue to monitor for signs of clinical improvement and recommendations per nephrology  Notify MD for rate changes as appropriate  Coretta Dexter, PharmD, [facility] 03/04/2024 3:24 AM

## 2024-03-04 NOTE — Plan of Care (Signed)
  Problem: Education: Goal: Knowledge of General Education information will improve Description: Including pain rating scale, medication(s)/side effects and non-pharmacologic comfort measures Outcome: Progressing   Problem: Clinical Measurements: Goal: Ability to maintain clinical measurements within normal limits will improve Outcome: Progressing Goal: Will remain free from infection Outcome: Progressing Goal: Diagnostic test results will improve Outcome: Progressing Goal: Respiratory complications will improve Outcome: Progressing Goal: Cardiovascular complication will be avoided Outcome: Progressing   Problem: Nutrition: Goal: Adequate nutrition will be maintained Outcome: Progressing   Problem: Coping: Goal: Level of anxiety will decrease Outcome: Progressing   Problem: Elimination: Goal: Will not experience complications related to urinary retention Outcome: Progressing   Problem: Safety: Goal: Ability to remain free from injury will improve Outcome: Progressing   Problem: Skin Integrity: Goal: Risk for impaired skin integrity will decrease Outcome: Progressing   Problem: Education: Goal: Knowledge of the prescribed therapeutic regimen will improve Outcome: Progressing

## 2024-03-04 NOTE — Progress Notes (Signed)
 MEDICATION RELATED CONSULT NOTE  Pharmacy Consult for 3% hypertonic saline Indication: hyponatremia  Labs: Sodium (mmol/L)  Date Value  03/04/2024 118 (LL)   Assessment: Baseline Na+: 118  Goal of Therapy:  Increase in Na by 4-6 mEq/L in 4-6 hours Do not exceed increase in Na by 10-12 mEq/L in 24 hours   Monitoring:  Date Time Na Rate/Comment  05/12 0837 121 30 mL/hr 05/12 1144 122 30 mL/hr   Plan:  Hypertonic saline started at 30 ml/hr started 03/04/24 7829  Check Na Q2H x2 then Q4H   Call RN to stop infusion and notify MD if: Na increases > 4 mEq/L in first 2 hours Na increases > 6 mEq/L in first 4 hours Na increases > 6 mEq/L in 6 hours  Na increases > 8 mEq/L in 8 hours (give D5W bolus) Continue to monitor for signs of clinical improvement and recommendations per nephrology  Notify MD for rate changes as appropriate  Barney Boozer, PharmD, BCPS 03/04/2024 7:07 AM

## 2024-03-04 NOTE — Progress Notes (Signed)
*  PRELIMINARY RESULTS* Echocardiogram 2D Echocardiogram has been performed.  Michael Padilla 03/04/2024, 8:27 AM

## 2024-03-05 DIAGNOSIS — E871 Hypo-osmolality and hyponatremia: Secondary | ICD-10-CM | POA: Diagnosis not present

## 2024-03-05 LAB — BASIC METABOLIC PANEL WITH GFR
Anion gap: 9 (ref 5–15)
BUN: 18 mg/dL (ref 8–23)
CO2: 30 mmol/L (ref 22–32)
Calcium: 8.5 mg/dL — ABNORMAL LOW (ref 8.9–10.3)
Chloride: 92 mmol/L — ABNORMAL LOW (ref 98–111)
Creatinine, Ser: 0.52 mg/dL — ABNORMAL LOW (ref 0.61–1.24)
GFR, Estimated: >60 mL/min (ref >60)
Glucose, Bld: 130 mg/dL — ABNORMAL HIGH (ref 70–99)
Potassium: 3.5 mmol/L (ref 3.5–5.1)
Sodium: 126 mmol/L — ABNORMAL LOW (ref 135–145)

## 2024-03-05 LAB — SODIUM
Sodium: 125 mmol/L — ABNORMAL LOW (ref 135–145)
Sodium: 130 mmol/L — ABNORMAL LOW (ref 135–145)
Sodium: 130 mmol/L — ABNORMAL LOW (ref 135–145)
Sodium: 131 mmol/L — ABNORMAL LOW (ref 135–145)
Sodium: 131 mmol/L — ABNORMAL LOW (ref 135–145)

## 2024-03-05 LAB — CBC
HCT: 42.3 % (ref 39.0–52.0)
Hemoglobin: 14.7 g/dL (ref 13.0–17.0)
MCH: 31.1 pg (ref 26.0–34.0)
MCHC: 34.8 g/dL (ref 30.0–36.0)
MCV: 89.6 fL (ref 80.0–100.0)
Platelets: 616 10*3/uL — ABNORMAL HIGH (ref 150–400)
RBC: 4.72 MIL/uL (ref 4.22–5.81)
RDW: 13.4 % (ref 11.5–15.5)
WBC: 16.3 10*3/uL — ABNORMAL HIGH (ref 4.0–10.5)
nRBC: 0 % (ref 0.0–0.2)

## 2024-03-05 LAB — PHOSPHORUS: Phosphorus: 3.2 mg/dL (ref 2.5–4.6)

## 2024-03-05 LAB — VITAMIN B12: Vitamin B-12: 654 pg/mL (ref 180–914)

## 2024-03-05 LAB — MAGNESIUM: Magnesium: 2.3 mg/dL (ref 1.7–2.4)

## 2024-03-05 MED ORDER — IPRATROPIUM-ALBUTEROL 0.5-2.5 (3) MG/3ML IN SOLN
3.0000 mL | Freq: Three times a day (TID) | RESPIRATORY_TRACT | Status: DC
  Administered 2024-03-05 – 2024-03-06 (×3): 3 mL via RESPIRATORY_TRACT
  Filled 2024-03-05 (×3): qty 3

## 2024-03-05 MED ORDER — SPIRONOLACTONE 25 MG PO TABS
25.0000 mg | ORAL_TABLET | Freq: Every day | ORAL | Status: DC
  Administered 2024-03-05 – 2024-03-11 (×7): 25 mg via ORAL
  Filled 2024-03-05 (×7): qty 1

## 2024-03-05 NOTE — Progress Notes (Signed)
 PT Cancellation Note  Patient Details Name: Michael Padilla MRN: [id number] DOB: 08/05/1956   Cancelled Treatment:    Reason Eval/Treat Not Completed: Other (comment).  Chart reviewed and attempted to see pt.  Pt ultimately declined participating with OT/PT as part of co-tx session.  During time spent in room, pt's HR was fluctuating at rest going up to 149bpm back down to 119bpm. PT will re-attempt at later date/time as medically appropriate.   Rozanna Corner, PT, DPT Physical Therapist - [facility]  03/05/24, 5:39 PM

## 2024-03-05 NOTE — Plan of Care (Signed)
  Problem: Education: Goal: Knowledge of General Education information will improve Description: Including pain rating scale, medication(s)/side effects and non-pharmacologic comfort measures Outcome: Progressing   Problem: Health Behavior/Discharge Planning: Goal: Ability to manage health-related needs will improve Outcome: Progressing   Problem: Clinical Measurements: Goal: Ability to maintain clinical measurements within normal limits will improve Outcome: Progressing Goal: Will remain free from infection Outcome: Progressing Goal: Diagnostic test results will improve Outcome: Progressing Goal: Respiratory complications will improve Outcome: Progressing   Problem: Coping: Goal: Level of anxiety will decrease Outcome: Progressing

## 2024-03-05 NOTE — TOC Progression Note (Signed)
 Transition of Care [facility]) - Progression Note    Patient Details  Name: DERONTA LUEPKE MRN: [id number] Date of Birth: 16-Apr-1956  [facility] CM/SW Contact  Laneya Gasaway A Ajahnae Rathgeber, RN Phone Number: 03/05/2024, 12:34 PM  Clinical Narrative:    Chart reviewed.  Noted that patient was admitted with  Hyponatremia.  Patient is 02 dependent at home.  Patient is currently on 5L per [place].  NA level remains low and patient is still on % hypertonic [place].   I have place substance abuse resources on patient discharge instructions.    TOC will continue to follow for discharge planning.         Expected Discharge Plan and Services                                               Social Determinants of Health (SDOH) Interventions SDOH Screenings   Food Insecurity: No Food Insecurity (03/03/2024)  Housing: Low Risk  (03/03/2024)  Transportation Needs: No Transportation Needs (03/03/2024)  Utilities: Not At Risk (03/03/2024)  Social Connections: Socially Isolated (03/03/2024)  Tobacco Use: High Risk (03/03/2024)    Readmission Risk Interventions     No data to display

## 2024-03-05 NOTE — Progress Notes (Signed)
 [facility] CLINIC CARDIOLOGY PROGRESS NOTE   Patient ID: Michael Padilla MRN: [id number] DOB/AGE: [birth year] 68 y.o.  Admit date: 03/03/2024 Referring Physician Dr. Daisey Dryer Primary Physician Pcp, No Primary Cardiologist None Reason for Consultation Acute heart failure  HPI: Michael Padilla is a 68 y.o. male with a past medical history of hypertension, alcohol use, tobacco use, COPD, hx CVA who presented to the ED on 03/03/2024 for worsening shortness of breath for last 5 days associated with pedal edema and orthopnea. Patient denies chest pain or palpitations.  Interval History: -Patient seen and examined this AM and laying comfortably in hospital bed. Patient states his SOB feels the same and denies chest pain or palpitations. LE edema is much improved this AM -Patients BP and HR elevated this AM. Overnight Tele showed no significant events.  -Patient diuresing great. UOP yesterday 2.45L, with stable Cr and BUN this AM.  -Hyponatremia improving at 126 this AM.  -Patient remains on 5L [place] with stable SpO2. Patient reports using O2 at home PRN.  Review of systems complete and found to be negative unless listed above    Vitals:   03/05/24 0830 03/05/24 0900 03/05/24 0930 03/05/24 1000  BP: (!) 151/93 123/68 137/81 119/80  Pulse: (!) 124 (!) 128 (!) 117 (!) 120  Resp: (!) 26 (!) 22 18 20   Temp:      TempSrc:      SpO2: 90% 90% 93% 99%  Weight:      Height:         Intake/Output Summary (Last 24 hours) at 03/05/2024 1036 Last data filed at 03/05/2024 1019 Gross per 24 hour  Intake 1746.4 ml  Output 2450 ml  Net -703.6 ml     PHYSICAL EXAM General: chronically ill appearing elderly male, well nourished, in no acute distress. HEENT: Normocephalic and atraumatic. Neck: No JVD.  Lungs: Normal respiratory effort on 5L [place]. Diminished bilaterally. Heart: HRR, elevated rates. Normal S1 and S2 without gallops or murmurs. Radial & DP pulses 2+ bilaterally. Abdomen: Non-distended  appearing.  Msk: Normal strength and tone for age. Extremities: No clubbing, cyanosis. 1+ LE bilaterally pitting edema   Neuro: Alert and oriented X 3. Psych: Mood appropriate, affect congruent.    LABS: Basic Metabolic Panel: Recent Labs    03/04/24 0837 03/04/24 1144 03/05/24 0419 03/05/24 0747  NA 121*   < > 126* 131*  K 4.0  --  3.5  --   CL 84*  --  92*  --   CO2 27  --  30  --   GLUCOSE 141*  --  130*  --   BUN 15  --  18  --   CREATININE 0.75  --  0.52*  --   CALCIUM 8.2*  --  8.5*  --   MG  --   --  2.3  --   PHOS  --   --  3.2  --    < > = values in this interval not displayed.   Liver Function Tests: Recent Labs    03/03/24 0822  AST 48*  ALT 27  ALKPHOS 88  BILITOT 1.7*  PROT 7.0  ALBUMIN 3.8   No results for input(s): "LIPASE", "AMYLASE" in the last 72 hours. CBC: Recent Labs    03/04/24 0219 03/05/24 0419  WBC 8.3 16.3*  HGB 14.1 14.7  HCT 39.8 42.3  MCV 87.5 89.6  PLT 663* 616*   Cardiac Enzymes: Recent Labs    03/03/24 0822 03/03/24 1054  TROPONINIHS 66* 62*   BNP: Recent Labs    03/03/24 0822  BNP 1,147.7*   D-Dimer: No results for input(s): "DDIMER" in the last 72 hours. Hemoglobin A1C: No results for input(s): "HGBA1C" in the last 72 hours. Fasting Lipid Panel: No results for input(s): "CHOL", "HDL", "LDLCALC", "TRIG", "CHOLHDL", "LDLDIRECT" in the last 72 hours. Thyroid  Function Tests: Recent Labs    03/03/24 1913  TSH 1.065   Anemia Panel: No results for input(s): "VITAMINB12", "FOLATE", "FERRITIN", "TIBC", "IRON", "RETICCTPCT" in the last 72 hours.  ECHOCARDIOGRAM COMPLETE Result Date: 03/04/2024    ECHOCARDIOGRAM REPORT   Patient Name:   Michael Padilla Date of Exam: 03/04/2024 Medical Rec #:  [id number]        Height:       68.0 in Accession #:    [id number]       Weight:       154.1 lb Date of Birth:  [birth year]        BSA:          1.829 m Patient Age:    68 years         BP:           124/88 mmHg Patient Gender: M                 HR:           107 bpm. Exam Location:  [facility] Procedure: 2D Echo, Color Doppler and Cardiac Doppler (Both Spectral and Color            Flow Doppler were utilized during procedure). Indications:     Congestive heart failure I50.9  History:         Patient has prior history of Echocardiogram examinations, most                  recent 01/03/2022. COPD.  Sonographer:     Broadus Canes Referring Phys:  [phone] STEVEN J NEWTON Diagnosing Phys: Lanell Pinta Custovic  Sonographer Comments: Technically challenging study due to limited acoustic windows, no apical window and no subcostal window. Image acquisition challenging due to COPD. IMPRESSIONS  1. Left ventricular ejection fraction, by estimation, is 45 to 50%. The left ventricle has mildly decreased function. The left ventricle demonstrates regional wall motion abnormalities (see scoring diagram/findings for description). Left ventricular diastolic parameters are indeterminate.  2. Right ventricular systolic function is normal. The right ventricular size is normal.  3. The mitral valve is normal in structure. Mild mitral valve regurgitation. No evidence of mitral stenosis.  4. The aortic valve is normal in structure. Aortic valve regurgitation is not visualized. No aortic stenosis is present.  5. The inferior vena cava is normal in size with greater than 50% respiratory variability, suggesting right atrial pressure of 3 mmHg. FINDINGS  Left Ventricle: Left ventricular ejection fraction, by estimation, is 45 to 50%. The left ventricle has mildly decreased function. The left ventricle demonstrates regional wall motion abnormalities. The left ventricular internal cavity size was normal in size. There is no left ventricular hypertrophy. Left ventricular diastolic parameters are indeterminate.  LV Wall Scoring: The apical anterior segment, apical inferior segment, and apex are hypokinetic. Right Ventricle: The right ventricular size is normal. No increase in right  ventricular wall thickness. Right ventricular systolic function is normal. Left Atrium: Left atrial size was normal in size. Right Atrium: Right atrial size was normal in size. Pericardium: There is no evidence of pericardial effusion. Mitral Valve: The mitral valve is normal in  structure. Mild mitral valve regurgitation. No evidence of mitral valve stenosis. Tricuspid Valve: The tricuspid valve is normal in structure. Tricuspid valve regurgitation is trivial. Aortic Valve: The aortic valve is normal in structure. Aortic valve regurgitation is not visualized. No aortic stenosis is present. Pulmonic Valve: The pulmonic valve was normal in structure. Pulmonic valve regurgitation is not visualized. Aorta: The aortic root is normal in size and structure. Venous: The inferior vena cava is normal in size with greater than 50% respiratory variability, suggesting right atrial pressure of 3 mmHg. IAS/Shunts: No atrial level shunt detected by color flow Doppler.  LEFT VENTRICLE PLAX 2D LVIDd:         4.00 cm LVIDs:         3.00 cm LV PW:         1.00 cm LV IVS:        1.40 cm LVOT diam:     2.20 cm LVOT Area:     3.80 cm  LEFT ATRIUM         Index LA diam:    2.40 cm 1.31 cm/m   AORTA Ao Root diam: 3.75 cm  SHUNTS Systemic Diam: 2.20 cm Lanell Pinta Custovic Electronically signed by Isabell Manzanilla Signature Date/Time: 03/04/2024/12:34:08 PM    Final      ECHO as above  TELEMETRY reviewed by me 03/05/24: sinus tachycardia, PACs rate 100s  EKG reviewed by me 03/05/24: sinus tachycardia, PACs, RBBB, rate 140 bpm  DATA reviewed by me 03/05/24: last 24h vitals tele labs imaging I/O hospitalist progress note.  Principal Problem:   Hyponatremia Active Problems:   Generalized weakness   Alcohol abuse   Acute on chronic respiratory failure with hypoxia (HCC)   Essential hypertension   COPD (chronic obstructive pulmonary disease) (HCC)   Elevated troponin    ASSESSMENT AND PLAN: Michael Padilla is a 68 y.o. male  with a past medical history of hypertension, alcohol use, tobacco use, COPD, hx CVA who presented to the ED on 03/03/2024 for worsening shortness of breath for last 5 days associated with pedal edema and orthopnea. Patient denies chest pain or palpitations.  # Acute Heart Failure mildly reduced EF # Hyponatremia # COPD  # Significant alcohol and tobacco use Patient presents to ED with worsening dyspnea, lower extremity edema and orthopnea. BNP elevated at 1147. EKG in ED with sinus tachycardia with PACs, RBBB rate 140 bpm. 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 BP borderline and HR elevated. Diuresed great yesterday with UOP 2.45 L, with stable Cr and BUN this AM. Na improving, 126 this AM. -Continue IV lasix  40 mg twice daily. Continue to monitor UOP and renal function closley.  -Keep K > 4 and Mg >2. -Continue lisinopril 2.5 mg daily.  -Continue metoprolol  succinate 12.5 mg daily. Will up titrate when pt reaches closer to euvolemia.  -Start spironolactone 25 mg daily.  -Consider adding SGLT2i tomorrow if renal function remains stable.  -Plan to optimize GDMT if BP and renal function allow. Can also initiate outpatient if needed.  -Plan for ischemic evaluation outpatient to evaluate new RWMA found on echo. -Hyponatremia and COPD management per primary.   This patient's case was discussed and created with Dr. Custovic and she is in agreement.  Signed:  Creighton Doffing, PA-C  03/05/2024, 10:36 AM [facility] Cardiology

## 2024-03-05 NOTE — Progress Notes (Signed)
 Progress Note   Patient: Michael Padilla ZDG:[id number] DOB: 23-Nov-1955 DOA: 03/03/2024     1 DOS: the patient was seen and examined on 03/05/2024   Brief hospital course:  Michael Padilla is a 68 y.o. male with medical history significant of Etoh abuse , COPD , arthritis, hx of CVA, essential hypertension, vit d def, B12 def presenting with acute on chronic respiratory failure hypoxia, volume overload, acute on chronic hyponatremia, alcohol abuse.  History from patient as well as the son at the bedside.  Per report, patient with increased work of breathing and shortness of breath the past 4 to 5 days. History of heavy alcohol use including at least 4 to 540 ounce beers daily.  Also smoking 1 to 2 packs/day.  Patient is supposed to wear 2 L nasal cannula at home chronically in setting of chronic respiratory failure.  However patient has been noncompliant for several months.  Positive orthopnea, PND.  Positive cough and trace wheezing.  Positive lower extremity swelling.  No fevers or chills.  Positive abdominal swelling.  No focal hemiparesis or confusion. Presented to the ER afebrile, heart rate 100s, BP stable.  Satting mid 90s on room air.  White count 8.7, hemoglobin 15.4, platelets 729, troponin in the 60s.  Sodium 116.  COVID flu and RSV negative.  Creatinine 0.46.  BNP 1148.  Chest x-ray with interstitial edema and pleural effusions.   Assessment and Plan:  Hyponatremia Noted to have a sodium level of 116 on admission and initially thought to be secondary to volume overload from acute CHF as well as beer potomania No improvement in his sodium levels with IV diuresis and was started on 3% saline. His sodium levels continue to improve on 3% saline and IV diruesis Plan:  Appreciate nephrology input, will continue 3% saline and IV diuresis    Acute on chronic respiratory failure with hypoxia (HCC) Secondary to acute CHF (unclear etiology,) Remains on 5 L nasal cannula Normally on 2 L  chronically but non adherent Wean as able   Acute on chronic HFmrEF Patient with worsening shortness of breath from his baseline associated with increased oxygen  requirement.  Imaging shows mild interstitial edema and small pleural effusions.  2D echocardiogram shows an LVEF of 45 to 50% with decreased LV systolic function. RWMA  O2 requirement stable at 5 L nasal cannula.  2.4 L urine output. Appreciate cardiology input  Continue Lasix  40 mg IV twice daily Will need ischemic evaluation  Continue thiamine      Alcohol abuse At risk for alcohol withdrawal Noted to be tachycardic with mild tremors Continue CIWA protocol and administer lorazepam  for CIWA score of 8 or greater    Acute nonischemic myocardial injury Secondary to acute on chronic heart failure and COPD exacerbation Trop 60s on presentation  No active chest pain  EKG stable    COPD (chronic obstructive pulmonary disease) (HCC) with acute exacerbation Continue systemic and inhaled steroids Continue bronchodilator therapy    Essential hypertension BP stable Continue metoprolol  and lisinopril Spiro started for HF    Generalized weakness Chronic generalized weakness in setting of heavy regular alcohol use and significant electrolyte abnormalities Monitor PT/OT     Severe malnutrition In the context of chronic illness Dietitian recommends  Ensure Enlive po TID, each supplement provides 350 kcal and 20 grams of protein. MVI, thiamine  and folic acid  po daily  Recommend 500 mg of thiamine  intravenously, infused over 30 minutes, three times daily for two consecutive days and 250 mg  intravenously once daily for an additional five days Liberalize diet  Pt at high refeed risk; recommend monitor potassium, magnesium and phosphorus labs daily until stable Daily weights  Check B12 level     Subjective: Shortness of breath remains the same.  No palpitations, or chest pain.   Physical Exam: Vitals:   03/05/24  0830 03/05/24 0900 03/05/24 0930 03/05/24 1000  BP: (!) 151/93 123/68 137/81 119/80  Pulse: (!) 124 (!) 128 (!) 117 (!) 120  Resp: (!) 26 (!) 22 18 20   Temp:      TempSrc:      SpO2: 90% 90% 93% 99%  Weight:      Height:         Physical Exam  Constitutional: In no distress.  Chronically ill-appearing Cardiovascular: Tachycardic rate, regular rhythm. 1+ bilateral lower extremity edema up to the hips Pulmonary: Non labored breathing on [place], no wheezing or rales.   Abdominal: Soft. Normal bowel sounds. Non distended and non tender Musculoskeletal: Normal range of motion.     Neurological: Alert and oriented to person, place, and time. Non focal  Skin: Skin is warm and dry.    Data Reviewed:    Latest Ref Rng & Units 03/05/2024    3:59 PM 03/05/2024   11:49 AM 03/05/2024    7:47 AM  BMP  Sodium 135 - 145 mmol/L 130  130  131      Family Communication: Plan of care was discussed with patient in detail.  He verbalizes understanding and agrees with the plan.  Disposition: Status is: Inpatient Remains inpatient appropriate because: On 3% saline for severe hyponatremia  Planned Discharge Destination: TBD    Time spent: 35 minutes  Author: Joette Mustard, MD 03/05/2024 11:44 AM  For on call review [email or web address].

## 2024-03-05 NOTE — Progress Notes (Incomplete)
 Heart Failure Stewardship Pharmacy Note  PCP: Pcp, No PCP-Cardiologist: None  HPI: Michael Padilla is a 68 y.o. male with alcohol abuse , COPD , arthritis, hx of CVA, essential hypertension, vitamin d  and B12 deficiencies who presented with chronic respiratory failure with hypoxia, volume overload, acute on chronic hyponatremia. On admission, BNP was 1147.7, HS-troponin was 66 > 62, and sodium was 116. Chest x-ray noted mild interstitial edema dn small pleural effusions.    Pertinent cardiac history: Echo in 12/2021 showed LVEF of 55-60%.   Pertinent Lab Values: Creatinine, Ser  Date Value Ref Range Status  03/05/2024 0.52 (L) 0.61 - 1.24 mg/dL Final   BUN  Date Value Ref Range Status  03/05/2024 18 8 - 23 mg/dL Final   Potassium  Date Value Ref Range Status  03/05/2024 3.5 3.5 - 5.1 mmol/L Final   Sodium  Date Value Ref Range Status  03/05/2024 126 (L) 135 - 145 mmol/L Final   B Natriuretic Peptide  Date Value Ref Range Status  03/03/2024 1,147.7 (H) 0.0 - 100.0 pg/mL Final    Comment:    Performed at [facility], [address] [place], [place] [address]   Magnesium  Date Value Ref Range Status  03/05/2024 2.3 1.7 - 2.4 mg/dL Final    Comment:    Performed at [facility], [address] [place], [place] [address]   Hgb A1c MFr Bld  Date Value Ref Range Status  02/15/2023 5.5 4.8 - 5.6 % Final    Comment:    (NOTE)         Prediabetes: 5.7 - 6.4         Diabetes: >6.4         Glycemic control for adults with diabetes: <7.0    TSH  Date Value Ref Range Status  03/03/2024 1.065 0.350 - 4.500 uIU/mL Final    Comment:    Performed by a 3rd Generation assay with a functional sensitivity of <=0.01 uIU/mL. Performed at [facility], [address] [place], [place] [address]     Vital Signs: Admission weight: Temp:  [98.2 F (36.8 C)-98.8 F (37.1 C)] 98.4 F (36.9 C) (05/13 0400) Pulse Rate:  [39-131] 39 (05/13  0700) Cardiac Rhythm: Atrial fibrillation (05/12 2030) Resp:  [15-40] 28 (05/13 0700) BP: (91-160)/(61-102) 131/88 (05/13 0700) SpO2:  [86 %-98 %] 92 % (05/13 0700) Weight:  [64.4 kg (141 lb 15.6 oz)] 64.4 kg (141 lb 15.6 oz) (05/13 0400)  Intake/Output Summary (Last 24 hours) at 03/05/2024 0733 Last data filed at 03/05/2024 0630 Gross per 24 hour  Intake 1403.9 ml  Output 2450 ml  Net -1046.1 ml    Current Heart Failure Medications:  Loop diuretic: Beta-Blocker: ACEI/ARB/ARNI: MRA: SGLT2i: Other:  Prior to admission Heart Failure Medications:  Loop diuretic: Beta-Blocker: ACEI/ARB/ARNI: MRA: SGLT2i: Other:  Assessment: 1. {CHL AMB Acute or Chronic:210917265}  - Plan: 1) Medication changes recommended at this time:  2) Patient assistance:   3) Education: -To be completed prior to discharge.  *** Medication Assistance / Insurance Benefits Check: Does the patient have prescription insurance?    Type of insurance plan:  Does the patient qualify for medication assistance through manufacturers or grants? {CHL AMB Yes/No/Pending:210917269}  Eligible grants and/or patient assistance programs: ***  Medication assistance applications in progress: ***  Medication assistance applications approved: *** Approved medication assistance renewals will be completed by: ***  Outpatient Pharmacy: Prior to admission outpatient pharmacy: ***      ***

## 2024-03-05 NOTE — Progress Notes (Signed)
 Heart Failure Navigator Progress Note  Assessed for Heart & Vascular TOC clinic readiness.  Does not meet criteria due to current [facility] patient.   Navigator will sign off at this time.  Roxy Horseman, RN, BSN [facility] Heart Failure Navigator Secure Chat Only

## 2024-03-05 NOTE — Plan of Care (Signed)
  Problem: Education: Goal: Knowledge of General Education information will improve Description: Including pain rating scale, medication(s)/side effects and non-pharmacologic comfort measures Outcome: Progressing   Problem: Health Behavior/Discharge Planning: Goal: Ability to manage health-related needs will improve Outcome: Progressing   Problem: Clinical Measurements: Goal: Ability to maintain clinical measurements within normal limits will improve Outcome: Progressing Goal: Will remain free from infection Outcome: Progressing Goal: Respiratory complications will improve Outcome: Progressing   Problem: Activity: Goal: Risk for activity intolerance will decrease Outcome: Progressing   Problem: Nutrition: Goal: Adequate nutrition will be maintained Outcome: Progressing   Problem: Coping: Goal: Level of anxiety will decrease Outcome: Progressing   Problem: Elimination: Goal: Will not experience complications related to urinary retention Outcome: Progressing   Problem: Safety: Goal: Ability to remain free from injury will improve Outcome: Progressing   Problem: Skin Integrity: Goal: Risk for impaired skin integrity will decrease Outcome: Progressing   Problem: Education: Goal: Knowledge of disease or condition will improve Outcome: Progressing

## 2024-03-05 NOTE — Progress Notes (Signed)
 OT Cancellation Note  Patient Details Name: Michael Padilla MRN: [id number] DOB: 12/15/1955   Cancelled Treatment:    Reason Eval/Treat Not Completed: Patient declined, no reason specified. Pt declined sitting working with OT on this date. Pt's HR was fluctuating at rest going up to 149bpm back down to 119bpm. OT will re-attempt OT evaluation on next available date/time and when pt is medially ready for participation.   Rosaria Common M.S. OTR/L  03/05/24, 1:24 PM

## 2024-03-05 NOTE — Progress Notes (Signed)
 [facility] [place], [place] 03/05/24  Subjective:   Hospital day # 1 Following patient for acute on chronic hyponatremia.  He has a history of alcohol abuse and COPD. Today he is more awake.  Reports that he was drinking 4-5 beers daily. Today sodium has improved to 130.  3% hypertonic solution was discontinued midday. Continues to have some lower extremity edema.  Good response to IV diuretics.  Urine output of about 2450 cc.    Objective:  Vital signs in last 24 hours:  Temp:  [98.2 F (36.8 C)-98.4 F (36.9 C)] 98.4 F (36.9 C) (05/13 0400) Pulse Rate:  [39-128] 106 (05/13 1600) Resp:  [15-43] 23 (05/13 1600) BP: (91-151)/(61-98) 117/92 (05/13 1530) SpO2:  [87 %-99 %] 92 % (05/13 1600) Weight:  [64.4 kg] 64.4 kg (05/13 0400)  Weight change: -1 kg Filed Weights   03/03/24 1947 03/04/24 0514 03/05/24 0400  Weight: 67.7 kg 69.9 kg 64.4 kg    Intake/Output:    Intake/Output Summary (Last 24 hours) at 03/05/2024 1630 Last data filed at 03/05/2024 1400 Gross per 24 hour  Intake 1796.56 ml  Output 1650 ml  Net 146.56 ml     Physical Exam: General: Laying in the bed, no acute distress  HEENT Moist mucous membranes  Pulm/lungs Normal breathing effort, [place] O2, decreased breath sounds at bases  CVS/Heart Irregular, tachycardic  Abdomen:  Soft, nontender, nondistended  Extremities: 1+ pitting edema bilaterally  Neurologic: Alert, able to answer questions appropriately  Skin: Warm dry          Basic Metabolic Panel:  Recent Labs  Lab 03/03/24 0822 03/03/24 1054 03/04/24 0837 03/04/24 1144 03/05/24 0014 03/05/24 0419 03/05/24 0747 03/05/24 1149 03/05/24 1559  NA 116*   < > 121*   < > 125* 126* 131* 130* 130*  K 4.5  --  4.0  --   --  3.5  --   --   --   CL 80*  --  84*  --   --  92*  --   --   --   CO2 24  --  27  --   --  30  --   --   --   GLUCOSE 78  --  141*  --   --  130*  --   --   --   BUN <5*  --  15  --   --  18  --   --   --    CREATININE 0.46*  --  0.75  --   --  0.52*  --   --   --   CALCIUM 8.6*  --  8.2*  --   --  8.5*  --   --   --   MG  --   --   --   --   --  2.3  --   --   --   PHOS  --   --   --   --   --  3.2  --   --   --    < > = values in this interval not displayed.     CBC: Recent Labs  Lab 03/03/24 0822 03/04/24 0219 03/05/24 0419  WBC 8.7 8.3 16.3*  HGB 15.4 14.1 14.7  HCT 44.8 39.8 42.3  MCV 88.9 87.5 89.6  PLT 729* 663* 616*     No results found for: "HEPBSAG", "HEPBSAB", "HEPBIGM"    Microbiology:  Recent Results (from the past  240 hours)  Resp panel by RT-PCR (RSV, Flu A&B, Covid) Anterior Nasal Swab     Status: None   Collection Time: 03/03/24  8:27 AM   Specimen: Anterior Nasal Swab  Result Value Ref Range Status   SARS Coronavirus 2 by RT PCR NEGATIVE NEGATIVE Final    Comment: (NOTE) SARS-CoV-2 target nucleic acids are NOT DETECTED.  The SARS-CoV-2 RNA is generally detectable in upper respiratory specimens during the acute phase of infection. The lowest concentration of SARS-CoV-2 viral copies this assay can detect is 138 copies/mL. A negative result does not preclude SARS-Cov-2 infection and should not be used as the sole basis for treatment or other patient management decisions. A negative result may occur with  improper specimen collection/handling, submission of specimen other than nasopharyngeal swab, presence of viral mutation(s) within the areas targeted by this assay, and inadequate number of viral copies(<138 copies/mL). A negative result must be combined with clinical observations, patient history, and epidemiological information. The expected result is Negative.  Fact Sheet for Patients:  BloggerCourse.com  Fact Sheet for Healthcare Providers:  SeriousBroker.it  This test is no t yet approved or cleared by the [place]  FDA and  has been authorized for detection and/or diagnosis of SARS-CoV-2  by FDA under an Emergency Use Authorization (EUA). This EUA will remain  in effect (meaning this test can be used) for the duration of the COVID-19 declaration under Section 564(b)(1) of the Act, 21 U.S.C.section 360bbb-3(b)(1), unless the authorization is terminated  or revoked sooner.       Influenza A by PCR NEGATIVE NEGATIVE Final   Influenza B by PCR NEGATIVE NEGATIVE Final    Comment: (NOTE) The Xpert Xpress SARS-CoV-2/FLU/RSV plus assay is intended as an aid in the diagnosis of influenza from Nasopharyngeal swab specimens and should not be used as a sole basis for treatment. Nasal washings and aspirates are unacceptable for Xpert Xpress SARS-CoV-2/FLU/RSV testing.  Fact Sheet for Patients: BloggerCourse.com  Fact Sheet for Healthcare Providers: SeriousBroker.it  This test is not yet approved or cleared by the [place]  FDA and has been authorized for detection and/or diagnosis of SARS-CoV-2 by FDA under an Emergency Use Authorization (EUA). This EUA will remain in effect (meaning this test can be used) for the duration of the COVID-19 declaration under Section 564(b)(1) of the Act, 21 U.S.C. section 360bbb-3(b)(1), unless the authorization is terminated or revoked.     Resp Syncytial Virus by PCR NEGATIVE NEGATIVE Final    Comment: (NOTE) Fact Sheet for Patients: BloggerCourse.com  Fact Sheet for Healthcare Providers: SeriousBroker.it  This test is not yet approved or cleared by the [place]  FDA and has been authorized for detection and/or diagnosis of SARS-CoV-2 by FDA under an Emergency Use Authorization (EUA). This EUA will remain in effect (meaning this test can be used) for the duration of the COVID-19 declaration under Section 564(b)(1) of the Act, 21 U.S.C. section 360bbb-3(b)(1), unless the authorization is terminated or revoked.  Performed at  [facility], [address] [place], [place] [address]   MRSA Next Gen by PCR, Nasal     Status: None   Collection Time: 03/04/24  6:40 AM   Specimen: Nasal Mucosa; Nasal Swab  Result Value Ref Range Status   MRSA by PCR Next Gen NOT DETECTED NOT DETECTED Final    Comment: (NOTE) The GeneXpert MRSA Assay (FDA approved for NASAL specimens only), is one component of a comprehensive MRSA colonization surveillance program. It is not intended to diagnose MRSA  infection nor to guide or monitor treatment for MRSA infections. Test performance is not FDA approved in patients less than 61 years old. Performed at [facility], [address] [place], [place] [address]     Coagulation Studies: No results for input(s): "LABPROT", "INR" in the last 72 hours.  Urinalysis: No results for input(s): "COLORURINE", "LABSPEC", "PHURINE", "GLUCOSEU", "HGBUR", "BILIRUBINUR", "KETONESUR", "PROTEINUR", "UROBILINOGEN", "NITRITE", "LEUKOCYTESUR" in the last 72 hours.  Invalid input(s): "APPERANCEUR"    Imaging: ECHOCARDIOGRAM COMPLETE Result Date: 03/04/2024    ECHOCARDIOGRAM REPORT   Patient Name:   Michael Padilla Date of Exam: 03/04/2024 Medical Rec #:  [id number]        Height:       68.0 in Accession #:    [id number]       Weight:       154.1 lb Date of Birth:  27-Jun-1956        BSA:          1.829 m Patient Age:    68 years         BP:           124/88 mmHg Patient Gender: M                HR:           107 bpm. Exam Location:  [facility] Procedure: 2D Echo, Color Doppler and Cardiac Doppler (Both Spectral and Color            Flow Doppler were utilized during procedure). Indications:     Congestive heart failure I50.9  History:         Patient has prior history of Echocardiogram examinations, most                  recent 01/03/2022. COPD.  Sonographer:     Broadus Canes Referring Phys:  [phone] STEVEN J NEWTON Diagnosing Phys: Lanell Pinta Custovic  Sonographer Comments: Technically challenging study  due to limited acoustic windows, no apical window and no subcostal window. Image acquisition challenging due to COPD. IMPRESSIONS  1. Left ventricular ejection fraction, by estimation, is 45 to 50%. The left ventricle has mildly decreased function. The left ventricle demonstrates regional wall motion abnormalities (see scoring diagram/findings for description). Left ventricular diastolic parameters are indeterminate.  2. Right ventricular systolic function is normal. The right ventricular size is normal.  3. The mitral valve is normal in structure. Mild mitral valve regurgitation. No evidence of mitral stenosis.  4. The aortic valve is normal in structure. Aortic valve regurgitation is not visualized. No aortic stenosis is present.  5. The inferior vena cava is normal in size with greater than 50% respiratory variability, suggesting right atrial pressure of 3 mmHg. FINDINGS  Left Ventricle: Left ventricular ejection fraction, by estimation, is 45 to 50%. The left ventricle has mildly decreased function. The left ventricle demonstrates regional wall motion abnormalities. The left ventricular internal cavity size was normal in size. There is no left ventricular hypertrophy. Left ventricular diastolic parameters are indeterminate.  LV Wall Scoring: The apical anterior segment, apical inferior segment, and apex are hypokinetic. Right Ventricle: The right ventricular size is normal. No increase in right ventricular wall thickness. Right ventricular systolic function is normal. Left Atrium: Left atrial size was normal in size. Right Atrium: Right atrial size was normal in size. Pericardium: There is no evidence of pericardial effusion. Mitral Valve: The mitral valve is normal in structure. Mild mitral valve regurgitation. No evidence of mitral valve stenosis.  Tricuspid Valve: The tricuspid valve is normal in structure. Tricuspid valve regurgitation is trivial. Aortic Valve: The aortic valve is normal in structure. Aortic  valve regurgitation is not visualized. No aortic stenosis is present. Pulmonic Valve: The pulmonic valve was normal in structure. Pulmonic valve regurgitation is not visualized. Aorta: The aortic root is normal in size and structure. Venous: The inferior vena cava is normal in size with greater than 50% respiratory variability, suggesting right atrial pressure of 3 mmHg. IAS/Shunts: No atrial level shunt detected by color flow Doppler.  LEFT VENTRICLE PLAX 2D LVIDd:         4.00 cm LVIDs:         3.00 cm LV PW:         1.00 cm LV IVS:        1.40 cm LVOT diam:     2.20 cm LVOT Area:     3.80 cm  LEFT ATRIUM         Index LA diam:    2.40 cm 1.31 cm/m   AORTA Ao Root diam: 3.75 cm  SHUNTS Systemic Diam: 2.20 cm Sabina Custovic Electronically signed by Isabell Manzanilla Signature Date/Time: 03/04/2024/12:34:08 PM    Final      Medications:    thiamine  (VITAMIN B1) injection 500 mg (03/05/24 1616)   Followed by   Cecily Cohen ON 03/06/2024] thiamine  (VITAMIN B1) injection      Chlorhexidine  Gluconate Cloth  6 each Topical Daily   enoxaparin  (LOVENOX ) injection  40 mg Subcutaneous Q24H   feeding supplement  237 mL Oral TID BM   folic acid   1 mg Oral Daily   furosemide   40 mg Intravenous Q12H   ipratropium-albuterol   3 mL Nebulization TID   lisinopril  2.5 mg Oral Daily   metoprolol  succinate  12.5 mg Oral Daily   multivitamin with minerals  1 tablet Oral Daily   nicotine   21 mg Transdermal Daily   predniSONE   40 mg Oral Q breakfast   sodium chloride  flush  3 mL Intravenous Q12H   spironolactone  25 mg Oral Daily   [START ON 03/07/2024] thiamine  (VITAMIN B1) injection  100 mg Intravenous Q24H   acetaminophen , albuterol , LORazepam  **OR** LORazepam , ondansetron  **OR** ondansetron  (ZOFRAN ) IV, sodium chloride  flush  Assessment/ Plan:  68 y.o. male with  medical problems of  alcohol abuse, COPD, arthritis, history of stroke, hypertension, nutritional deficiencies including vitamin D , B12.  admitted on  03/03/2024 for Hyponatremia [E87.1] Dyspnea, unspecified type [R06.00]  #Hyponatremia Likely multifactorial.  Patient has underlying COPD and Hx of etoh abuse which may be the cause of chronic hyponatremia. Acute worsening could be secondary to alcohol abuse and volume overload. Plan: -Sodium level this morning is 130 and is close to baseline.  May discontinue 3% sodium and chloride solution. -Recommended to patient to try high-protein drinks such as Ensure twice a day. -Still has some residual lower extremity edema.  Continue furosemide  40 mg IV twice a day for 1 to 2 days until volume is optimized. - Agree with cardiology team guarding starting Farxiga provided renal function stays stable    LOS: 1 Horace Wishon 5/13/20254:30 PM  [facility] [place], [place] [phone]  Note: This note was prepared with Dragon dictation. Any transcription errors are unintentional

## 2024-03-06 ENCOUNTER — Telehealth (HOSPITAL_COMMUNITY): Payer: Self-pay | Admitting: Pharmacy Technician

## 2024-03-06 ENCOUNTER — Other Ambulatory Visit (HOSPITAL_COMMUNITY): Payer: Self-pay

## 2024-03-06 DIAGNOSIS — E871 Hypo-osmolality and hyponatremia: Secondary | ICD-10-CM | POA: Diagnosis not present

## 2024-03-06 LAB — BASIC METABOLIC PANEL WITH GFR
Anion gap: 7 (ref 5–15)
BUN: 24 mg/dL — ABNORMAL HIGH (ref 8–23)
CO2: 32 mmol/L (ref 22–32)
Calcium: 8.6 mg/dL — ABNORMAL LOW (ref 8.9–10.3)
Chloride: 93 mmol/L — ABNORMAL LOW (ref 98–111)
Creatinine, Ser: 0.66 mg/dL (ref 0.61–1.24)
GFR, Estimated: >60 mL/min (ref >60)
Glucose, Bld: 110 mg/dL — ABNORMAL HIGH (ref 70–99)
Potassium: 3.1 mmol/L — ABNORMAL LOW (ref 3.5–5.1)
Sodium: 132 mmol/L — ABNORMAL LOW (ref 135–145)

## 2024-03-06 LAB — CBC WITH DIFFERENTIAL/PLATELET
Abs Immature Granulocytes: 0.12 10*3/uL — ABNORMAL HIGH (ref 0.00–0.07)
Basophils Absolute: 0 10*3/uL (ref 0.0–0.1)
Basophils Relative: 0 %
Eosinophils Absolute: 0 10*3/uL (ref 0.0–0.5)
Eosinophils Relative: 0 %
HCT: 42.1 % (ref 39.0–52.0)
Hemoglobin: 14.2 g/dL (ref 13.0–17.0)
Immature Granulocytes: 1 %
Lymphocytes Relative: 4 %
Lymphs Abs: 0.6 10*3/uL — ABNORMAL LOW (ref 0.7–4.0)
MCH: 30.9 pg (ref 26.0–34.0)
MCHC: 33.7 g/dL (ref 30.0–36.0)
MCV: 91.5 fL (ref 80.0–100.0)
Monocytes Absolute: 1.8 10*3/uL — ABNORMAL HIGH (ref 0.1–1.0)
Monocytes Relative: 12 %
Neutro Abs: 13.1 10*3/uL — ABNORMAL HIGH (ref 1.7–7.7)
Neutrophils Relative %: 83 %
Platelets: 588 10*3/uL — ABNORMAL HIGH (ref 150–400)
RBC: 4.6 MIL/uL (ref 4.22–5.81)
RDW: 13.7 % (ref 11.5–15.5)
WBC: 15.7 10*3/uL — ABNORMAL HIGH (ref 4.0–10.5)
nRBC: 0 % (ref 0.0–0.2)

## 2024-03-06 LAB — MAGNESIUM: Magnesium: 2.2 mg/dL (ref 1.7–2.4)

## 2024-03-06 LAB — SODIUM
Sodium: 132 mmol/L — ABNORMAL LOW (ref 135–145)
Sodium: 132 mmol/L — ABNORMAL LOW (ref 135–145)
Sodium: 133 mmol/L — ABNORMAL LOW (ref 135–145)

## 2024-03-06 LAB — PHOSPHORUS: Phosphorus: 2.6 mg/dL (ref 2.5–4.6)

## 2024-03-06 MED ORDER — GUAIFENESIN ER 600 MG PO TB12
600.0000 mg | ORAL_TABLET | Freq: Two times a day (BID) | ORAL | Status: DC
  Administered 2024-03-06 – 2024-03-11 (×11): 600 mg via ORAL
  Filled 2024-03-06 (×11): qty 1

## 2024-03-06 MED ORDER — POTASSIUM CHLORIDE CRYS ER 20 MEQ PO TBCR
40.0000 meq | EXTENDED_RELEASE_TABLET | ORAL | Status: AC
  Administered 2024-03-06 (×2): 40 meq via ORAL
  Filled 2024-03-06 (×2): qty 2

## 2024-03-06 MED ORDER — IPRATROPIUM-ALBUTEROL 0.5-2.5 (3) MG/3ML IN SOLN
3.0000 mL | Freq: Two times a day (BID) | RESPIRATORY_TRACT | Status: DC
  Administered 2024-03-06 – 2024-03-11 (×10): 3 mL via RESPIRATORY_TRACT
  Filled 2024-03-06 (×10): qty 3

## 2024-03-06 MED ORDER — LOSARTAN POTASSIUM 25 MG PO TABS
12.5000 mg | ORAL_TABLET | Freq: Every day | ORAL | Status: DC
  Administered 2024-03-06: 12.5 mg via ORAL
  Filled 2024-03-06 (×2): qty 0.5

## 2024-03-06 MED ORDER — METOPROLOL SUCCINATE ER 50 MG PO TB24: 25.0000 mg | ORAL_TABLET | Freq: Every day | ORAL | Status: DC

## 2024-03-06 MED ORDER — METOPROLOL TARTRATE 5 MG/5ML IV SOLN: 5.0000 mg | Freq: Three times a day (TID) | INTRAVENOUS | Status: DC | PRN

## 2024-03-06 NOTE — Progress Notes (Deleted)
 Heart Failure Stewardship Pharmacy Note  PCP: Pcp, No PCP-Cardiologist: None  HPI: Michael Padilla is a 68 y.o. male with alcohol abuse , COPD , arthritis, hx of CVA, essential hypertension, vitamin d  and B12 deficiencies who presented with chronic respiratory failure with hypoxia, volume overload, acute on chronic hyponatremia. On admission, BNP was 1147.7, HS-troponin was 66 > 62, and sodium was 116. Chest x-ray noted mild interstitial edema and small pleural effusions.    Pertinent cardiac history: Echo in 12/2021 showed LVEF of 55-60%. Echo this admission showed LVEF of 45-50%.  Pertinent Lab Values: Creatinine, Ser  Date Value Ref Range Status  03/06/2024 0.66 0.61 - 1.24 mg/dL Final   BUN  Date Value Ref Range Status  03/06/2024 24 (H) 8 - 23 mg/dL Final   Potassium  Date Value Ref Range Status  03/06/2024 3.1 (L) 3.5 - 5.1 mmol/L Final   Sodium  Date Value Ref Range Status  03/06/2024 132 (L) 135 - 145 mmol/L Final    Comment:    Performed at [facility], [address] [place], [place] [address]   B Natriuretic Peptide  Date Value Ref Range Status  03/03/2024 1,147.7 (H) 0.0 - 100.0 pg/mL Final    Comment:    Performed at [facility], [address] [place], [place] [address]   Magnesium  Date Value Ref Range Status  03/06/2024 2.2 1.7 - 2.4 mg/dL Final    Comment:    Performed at [facility], [address] [place], [place] [address]   Hgb A1c MFr Bld  Date Value Ref Range Status  02/15/2023 5.5 4.8 - 5.6 % Final    Comment:    (NOTE)         Prediabetes: 5.7 - 6.4         Diabetes: >6.4         Glycemic control for adults with diabetes: <7.0    TSH  Date Value Ref Range Status  03/03/2024 1.065 0.350 - 4.500 uIU/mL Final    Comment:    Performed by a 3rd Generation assay with a functional sensitivity of <=0.01 uIU/mL. Performed at [facility], [address] [place], [place] [address]    Vital  Signs:  Temp:  [97.9 F (36.6 C)-98.6 F (37 C)] 98.1 F (36.7 C) (05/14 1200) Pulse Rate:  [64-122] 108 (05/14 1200) Cardiac Rhythm: Atrial fibrillation (05/14 0830) Resp:  [17-30] 24 (05/14 1200) BP: (112-140)/(66-114) 124/95 (05/14 1200) SpO2:  [87 %-100 %] 93 % (05/14 1200) Weight:  [63.6 kg (140 lb 3.4 oz)] 63.6 kg (140 lb 3.4 oz) (05/14 0500)  Intake/Output Summary (Last 24 hours) at 03/06/2024 1533 Last data filed at 03/06/2024 1523 Gross per 24 hour  Intake 933.36 ml  Output 3100 ml  Net -2166.64 ml    Current Heart Failure Medications:  Loop diuretic: furosemide  40 mg IV q12h Beta-Blocker: metoprolol  succinate 25 mg daily ACEI/ARB/ARNI: losartan 12.5 mg daily MRA: spironolactone 25 mg daily SGLT2i: none Other: none  Prior to admission Heart Failure Medications:  None  Assessment: 1. 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 NYHA class II symptoms.  -Symptoms: Patient reports shortness of breath has improved since admission. No LEE noted.  -Volume: Creatinine and BUN slightly elevated from yesterday. Weight down ~ 4 pounds from admission. Can consider transitioning to PO diuretics tomorrow.  -Hemodynamics: BP normal. HR 100-110s.  -UY:QIHKVQQV metoprolol  succinate 25 mg daily.  -ACEI/ARB/ARNI: Continue losartan 12.5 mg  daily. Can consider switching to Entresto pending insurance coverage.  -MRA: Continue spironolactone 25 mg daily.  -SGLT2i: Can consider adding Farxiga 10 mg daily pending insurance coverage.   Plan: 1) Medication changes recommended at this time: - None  2) Patient assistance: - Pending  3) Education: -To be completed prior to discharge. - Patient has been educated on current HF medications and potential additions to HF medication regimen - Patient verbalizes understanding that over the next few months, these medication doses may change and more medications may be added to optimize HF regimen - Patient has been  educated on basic disease state pathophysiology and goals of therapy   Medication Assistance / Insurance Benefits Check: Does the patient have prescription insurance? [facility]   Type of insurance plan:  Does the patient qualify for medication assistance through manufacturers or grants? Pending  Outpatient Pharmacy: [facility]     Galvin Jules, Student Pharmacist

## 2024-03-06 NOTE — Evaluation (Signed)
 Physical Therapy Evaluation Patient Details Name: Michael Padilla MRN: [id number] DOB: 1956/10/21 Today's Date: 03/06/2024  History of Present Illness  Pt is a 68 y/o M admitted on 03/03/24 after presenting with c/o increased work of breathing & SOB. Pt reports drinking 4-5 40oz beers/day & smoking 1-2 packs/day. Pt supposed to use 2L O2 chronically but has been noncompliant for several months. Pt is being treated for hyponatremia, acute on chronic respiratory failure with hypoxia, acute on chronic HFmrEF, & acute nonischemic MI.  PMH: EtOH abuse, COPD, arthritis, CVA, HTN, vitamin D  & B12 deficiencies  Clinical Impression  Pt seen for PT evaluation with pt agreeable with encouragement. Pt reports prior to admission he was independent without AD, denies falls. Pt reports he lives with his son who works during the day. On this date, pt is able to complete bed mobility with CGA, stand pivot bed>recliner with HHA with min assist, cuing re: sequencing/technique but poor return demo. Pt declined further gait attempts 2/2 wanting to eat breakfast. Will continue to follow pt acutely to progress gait & further assess balance.        If plan is discharge home, recommend the following: A little help with bathing/dressing/bathroom;A little help with walking and/or transfers;Assistance with cooking/housework;Assist for transportation;Help with stairs or ramp for entrance   Can travel by private vehicle        Equipment Recommendations Wheelchair cushion (measurements PT)  Recommendations for Other Services       Functional Status Assessment Patient has had a recent decline in their functional status and demonstrates the ability to make significant improvements in function in a reasonable and predictable amount of time.     Precautions / Restrictions Precautions Precautions: Fall Precaution/Restrictions Comments: watch HR, O2 Restrictions Weight Bearing Restrictions Per Provider Order: No       Mobility  Bed Mobility Overal bed mobility: Needs Assistance Bed Mobility: Supine to Sit     Supine to sit: Contact guard (exit L side of bed)          Transfers Overall transfer level: Needs assistance Equipment used: 1 person hand held assist Transfers: Sit to/from Stand, Bed to chair/wheelchair/BSC Sit to Stand: Supervision   Step pivot transfers: Min assist, Contact guard assist (cuing re: hand placement but decreased ability to follow cuing)            Ambulation/Gait                  Stairs            Wheelchair Mobility     Tilt Bed    Modified Rankin (Stroke Patients Only)       Balance Overall balance assessment: Needs assistance Sitting-balance support: Feet supported, Bilateral upper extremity supported Sitting balance-Leahy Scale: Good     Standing balance support: During functional activity, Single extremity supported Standing balance-Leahy Scale: Fair                               Pertinent Vitals/Pain Pain Assessment Pain Assessment: No/denies pain    Home Living Family/patient expects to be discharged to:: Private residence Living Arrangements: Children (son) Available Help at Discharge: Family;Available PRN/intermittently Type of Home: Apartment Home Access: Level entry       Home Layout: One level Home Equipment: Cane - single point;Rollator (4 wheels) Additional Comments: Per chart, pt supposed to use 2L/min O2 at baseline but when asked about using supplemental O2, pt  states "I don't need it".    Prior Function Prior Level of Function : Independent/Modified Independent             Mobility Comments: Reports he's ambulatory without AD, denies falls. ADLs Comments: Pt reports being Ind at baseline with self care tasks. Pt does not drive. Son assists with appointments and IADLs.     Extremity/Trunk Assessment   Upper Extremity Assessment Upper Extremity Assessment: Overall WFL for tasks  assessed    Lower Extremity Assessment Lower Extremity Assessment: Generalized weakness       Communication   Communication Communication: No apparent difficulties    Cognition Arousal: Alert Behavior During Therapy: WFL for tasks assessed/performed   PT - Cognitive impairments: Safety/Judgement                       PT - Cognition Comments: decreased safety awareness, reporting he doesn't require supplemental O2 even though chart states differently Following commands: Intact       Cueing Cueing Techniques: Verbal cues     General Comments General comments (skin integrity, edema, etc.): HR elevated at rest, max HR 149 bpm, SPO2 drops into 80s with mobility but pt recovers, denies feeling SOB, on 3L/min via nasal cannula throughout session    Exercises     Assessment/Plan    PT Assessment Patient needs continued PT services  PT Problem List Decreased strength;Cardiopulmonary status limiting activity;Decreased activity tolerance;Decreased balance;Decreased mobility;Decreased safety awareness;Decreased knowledge of use of DME       PT Treatment Interventions DME instruction;Balance training;Gait training;Neuromuscular re-education;Stair training;Patient/family education;Functional mobility training;Therapeutic activities;Therapeutic exercise    PT Goals (Current goals can be found in the Care Plan section)  Acute Rehab PT Goals Patient Stated Goal: none stated PT Goal Formulation: With patient Time For Goal Achievement: 03/20/24 Potential to Achieve Goals: Good    Frequency Min 2X/week     Co-evaluation PT/OT/SLP Co-Evaluation/Treatment: Yes Reason for Co-Treatment:  (elevated HR, unsure pt can medically tolerate 2 seperate OOB sessions) PT goals addressed during session: Mobility/safety with mobility;Balance         AM-PAC PT "6 Clicks" Mobility  Outcome Measure Help needed turning from your back to your side while in a flat bed without using  bedrails?: A Little Help needed moving from lying on your back to sitting on the side of a flat bed without using bedrails?: A Little Help needed moving to and from a bed to a chair (including a wheelchair)?: A Little Help needed standing up from a chair using your arms (e.g., wheelchair or bedside chair)?: A Little Help needed to walk in hospital room?: A Lot Help needed climbing 3-5 steps with a railing? : A Lot 6 Click Score: 16    End of Session Equipment Utilized During Treatment: Oxygen  Activity Tolerance: Patient tolerated treatment well (declines further mobility 2/2 requesting to eat breakfast) Patient left: in chair;with call bell/phone within reach Nurse Communication: Mobility status PT Visit Diagnosis: Unsteadiness on feet (R26.81);Muscle weakness (generalized) (M62.81);Other abnormalities of gait and mobility (R26.89);Difficulty in walking, not elsewhere classified (R26.2)    Time: 1610-9604 PT Time Calculation (min) (ACUTE ONLY): 9 min   Charges:   PT Evaluation $PT Eval Moderate Complexity: 1 Mod   PT General Charges $$ ACUTE PT VISIT: 1 Visit         [name], PT, DPT 03/06/24, 1:13 PM   [name] 03/06/2024, 1:12 PM

## 2024-03-06 NOTE — Progress Notes (Signed)
 Heart Failure Stewardship Pharmacy Note  PCP: Pcp, No PCP-Cardiologist: None  HPI: Michael Padilla is a 68 y.o. male with alcohol abuse , COPD , arthritis, hx of CVA, essential hypertension, vitamin d  and B12 deficiencies who presented with chronic respiratory failure with hypoxia, volume overload, acute on chronic hyponatremia. On admission, BNP was 1147.7, HS-troponin was 66 > 62, and sodium was 116. Chest x-ray noted mild interstitial edema and small pleural effusions.    Pertinent cardiac history: Echo in 12/2021 showed LVEF of 55-60%. Echo this admission showed LVEF of 45-50%.  Pertinent Lab Values: Creatinine, Ser  Date Value Ref Range Status  03/06/2024 0.66 0.61 - 1.24 mg/dL Final   BUN  Date Value Ref Range Status  03/06/2024 24 (H) 8 - 23 mg/dL Final   Potassium  Date Value Ref Range Status  03/06/2024 3.1 (L) 3.5 - 5.1 mmol/L Final   Sodium  Date Value Ref Range Status  03/06/2024 132 (L) 135 - 145 mmol/L Final    Comment:    Performed at [facility], [address] [place], [place] [address]   B Natriuretic Peptide  Date Value Ref Range Status  03/03/2024 1,147.7 (H) 0.0 - 100.0 pg/mL Final    Comment:    Performed at [facility], [address] [place], [place] [address]   Magnesium  Date Value Ref Range Status  03/06/2024 2.2 1.7 - 2.4 mg/dL Final    Comment:    Performed at [facility], [address] [place], [place] [address]   Hgb A1c MFr Bld  Date Value Ref Range Status  02/15/2023 5.5 4.8 - 5.6 % Final    Comment:    (NOTE)         Prediabetes: 5.7 - 6.4         Diabetes: >6.4         Glycemic control for adults with diabetes: <7.0    TSH  Date Value Ref Range Status  03/03/2024 1.065 0.350 - 4.500 uIU/mL Final    Comment:    Performed by a 3rd Generation assay with a functional sensitivity of <=0.01 uIU/mL. Performed at [facility], [address] [place], [place] [address]     Vital  Signs:  Temp:  [97.9 F (36.6 C)-98.6 F (37 C)] 98.1 F (36.7 C) (05/14 1200) Pulse Rate:  [64-122] 108 (05/14 1200) Cardiac Rhythm: Atrial fibrillation (05/14 0830) Resp:  [17-30] 24 (05/14 1200) BP: (112-140)/(66-114) 124/95 (05/14 1200) SpO2:  [87 %-100 %] 93 % (05/14 1200) Weight:  [63.6 kg (140 lb 3.4 oz)] 63.6 kg (140 lb 3.4 oz) (05/14 0500)  Intake/Output Summary (Last 24 hours) at 03/06/2024 1539 Last data filed at 03/06/2024 1523 Gross per 24 hour  Intake 933.36 ml  Output 3100 ml  Net -2166.64 ml    Current Heart Failure Medications:  Loop diuretic: furosemide  40 mg IV q12h Beta-Blocker: metoprolol  succinate 25 mg daily ACEI/ARB/ARNI: losartan 12.5 mg daily MRA: spironolactone 25 mg daily SGLT2i: Other:  Prior to admission Heart Failure Medications:  None   Assessment: 1. Acute on chronic systolic heart failure (LVEF 45-50%), due to presumed NICM. NYHA class II symptoms.  -Symptoms: Patient reports shortness of breath has improved since admission. No LEE noted.  -Volume: Creatinine and BUN slightly elevated from yesterday. Weight down ~ 4 pounds from admission. May be able to transition to oral diuretics tomorrow. -Hemodynamics: BP normal to high. HR 100-110s.  -NU:UVOZDGUY metoprolol  succinate 25 mg daily.  -ACEI/ARB/ARNI: Continue  losartan 12.5 mg daily. Can consider switching to Entresto eventually if BP allows and covered by insurance.  -MRA: Continue spironolactone 25 mg daily.  -SGLT2i: Can consider adding Farxiga 10 mg daily pending insurance coverage.    Plan: 1) Medication changes recommended at this time: - None   2) Patient assistance: - Pending   3) Education: -To be completed prior to discharge. - Patient has been educated on current HF medications and potential additions to HF medication regimen - Patient verbalizes understanding that over the next few months, these medication doses may change and more medications may be added to optimize HF  regimen - Patient has been educated on basic disease state pathophysiology and goals of therapy    Medication Assistance / Insurance Benefits Check: Does the patient have prescription insurance? [facility]    Type of insurance plan:  Does the patient qualify for medication assistance through manufacturers or grants? Pending   Outpatient Pharmacy[facility]  Please do not hesitate to reach out with questions or concerns,  Bevely Brush, PharmD, CPP, BCPS Heart Failure Pharmacist  Phone - [phone] 03/06/2024 3:39 PM

## 2024-03-06 NOTE — Plan of Care (Signed)
  Problem: Education: Goal: Knowledge of General Education information will improve Description: Including pain rating scale, medication(s)/side effects and non-pharmacologic comfort measures Outcome: Progressing   Problem: Clinical Measurements: Goal: Ability to maintain clinical measurements within normal limits will improve Outcome: Progressing Goal: Will remain free from infection Outcome: Progressing Goal: Diagnostic test results will improve Outcome: Progressing Goal: Respiratory complications will improve Outcome: Progressing Goal: Cardiovascular complication will be avoided Outcome: Progressing   Problem: Activity: Goal: Risk for activity intolerance will decrease Outcome: Not Progressing   Problem: Nutrition: Goal: Adequate nutrition will be maintained Outcome: Not Progressing   Problem: Coping: Goal: Level of anxiety will decrease Outcome: Progressing   Problem: Elimination: Goal: Will not experience complications related to bowel motility Outcome: Progressing Goal: Will not experience complications related to urinary retention Outcome: Progressing   Problem: Pain Managment: Goal: General experience of comfort will improve and/or be controlled Outcome: Progressing

## 2024-03-06 NOTE — Progress Notes (Signed)
 [facility] CLINIC CARDIOLOGY PROGRESS NOTE   Patient ID: Michael Padilla MRN: [id number] DOB/AGE: 02/26/1956 68 y.o.  Admit date: 03/03/2024 Referring Physician Dr. Daisey Dryer Primary Physician Pcp, No Primary Cardiologist None Reason for Consultation Acute heart failure  HPI: Michael Padilla is a 68 y.o. male with a past medical history of hypertension, alcohol use, tobacco use, COPD, hx CVA who presented to the ED on 03/03/2024 for worsening shortness of breath for last 5 days associated with pedal edema and orthopnea. Patient denies chest pain or palpitations.  Interval History: -Patient seen and examined this AM and laying comfortably in hospital bed. Patient states his SOB feels the same and denies chest pain or palpitations. LE edema continues to improve. -Patients BP and HR elevated this AM. Overnight Tele showed no significant events.  -Patient diuresing great. UOP yesterday 2.7L, with stable Cr and BUN this AM.  -Hyponatremia improving at 133 this AM.  -Patient weaning on O2, now on 3L [place] with stable SpO2. Patient reports using O2 at home PRN.  Review of systems complete and found to be negative unless listed above    Vitals:   03/06/24 0830 03/06/24 0900 03/06/24 1000 03/06/24 1100  BP: (!) 138/91 (!) 136/108 117/84   Pulse: (!) 103 (!) 122 (!) 122   Resp: 17 (!) 22 (!) 22 (!) 24  Temp:      TempSrc:      SpO2: 94% 95% 92%   Weight:      Height:         Intake/Output Summary (Last 24 hours) at 03/06/2024 1112 Last data filed at 03/06/2024 0800 Gross per 24 hour  Intake 743.52 ml  Output 3100 ml  Net -2356.48 ml     PHYSICAL EXAM General: chronically ill appearing elderly male, well nourished, in no acute distress. HEENT: Normocephalic and atraumatic. Neck: No JVD.  Lungs: Normal respiratory effort on 3L [place]. Diminished bilaterally. Heart: HRR, elevated rates. Normal S1 and S2 without gallops or murmurs. Radial & DP pulses 2+ bilaterally. Abdomen: Non-distended  appearing.  Msk: Normal strength and tone for age. Extremities: No clubbing, cyanosis. Trace LE bilaterally pitting edema   Neuro: Alert and oriented X 3. Psych: Mood appropriate, affect congruent.    LABS: Basic Metabolic Panel: Recent Labs    03/05/24 0419 03/05/24 0747 03/06/24 0402 03/06/24 0834  NA 126*   < > 132* 133*  K 3.5  --  3.1*  --   CL 92*  --  93*  --   CO2 30  --  32  --   GLUCOSE 130*  --  110*  --   BUN 18  --  24*  --   CREATININE 0.52*  --  0.66  --   CALCIUM 8.5*  --  8.6*  --   MG 2.3  --  2.2  --   PHOS 3.2  --  2.6  --    < > = values in this interval not displayed.   Liver Function Tests: No results for input(s): "AST", "ALT", "ALKPHOS", "BILITOT", "PROT", "ALBUMIN" in the last 72 hours.  No results for input(s): "LIPASE", "AMYLASE" in the last 72 hours. CBC: Recent Labs    03/05/24 0419 03/06/24 0402  WBC 16.3* 15.7*  NEUTROABS  --  13.1*  HGB 14.7 14.2  HCT 42.3 42.1  MCV 89.6 91.5  PLT 616* 588*   Cardiac Enzymes: No results for input(s): "CKTOTAL", "CKMB", "CKMBINDEX", "TROPONINIHS" in the last 72 hours.  BNP: No results  for input(s): "BNP" in the last 72 hours.  D-Dimer: No results for input(s): "DDIMER" in the last 72 hours. Hemoglobin A1C: No results for input(s): "HGBA1C" in the last 72 hours. Fasting Lipid Panel: No results for input(s): "CHOL", "HDL", "LDLCALC", "TRIG", "CHOLHDL", "LDLDIRECT" in the last 72 hours. Thyroid  Function Tests: Recent Labs    03/03/24 1913  TSH 1.065   Anemia Panel: Recent Labs    03/05/24 0419  VITAMINB12 654    No results found.    ECHO as above  TELEMETRY reviewed by me 03/06/24: sinus tachycardia, PACs rate 100s  EKG reviewed by me 03/06/24: sinus tachycardia, PACs, RBBB, rate 140 bpm  DATA reviewed by me 03/06/24: last 24h vitals tele labs imaging I/O hospitalist progress note.  Principal Problem:   Hyponatremia Active Problems:   Generalized weakness   Alcohol  abuse   Acute on chronic respiratory failure with hypoxia (HCC)   Essential hypertension   COPD (chronic obstructive pulmonary disease) (HCC)   Elevated troponin    ASSESSMENT AND PLAN: Michael Padilla is a 68 y.o. male with a past medical history of hypertension, alcohol use, tobacco use, COPD, hx CVA who presented to the ED on 03/03/2024 for worsening shortness of breath for last 5 days associated with pedal edema and orthopnea. Patient denies chest pain or palpitations.  # Acute Heart Failure mildly reduced EF # Hyponatremia # COPD  # Significant alcohol and tobacco use Patient presents to ED with worsening dyspnea, lower extremity edema and orthopnea. BNP elevated at 1147. EKG in ED with sinus tachycardia with PACs, RBBB rate 140 bpm. 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 BP and HR elevated. Diuresed great yesterday with UOP 2.7 L, with stable Cr and BUN this AM. Na improving, 132 this AM. -Continue IV lasix  40 mg twice daily. Continue to monitor UOP and renal function closley.  -Keep K > 4 and Mg >2. -Start Losartan 12.5 mg daily. -Increase metoprolol  succinate to 25 mg daily. -Continue spironolactone 25 mg daily.  -Consider adding SGLT2i tomorrow if renal function remains stable.  -Plan to optimize GDMT if BP and renal function allow. Can also initiate outpatient if needed.  -Plan for ischemic evaluation outpatient to evaluate new RWMA found on echo. -Hyponatremia and COPD management per primary.   This patient's case was discussed and created with Dr. Custovic and she is in agreement.  Signed:  Creighton Doffing, PA-C  03/06/2024, 11:12 AM [facility] Cardiology

## 2024-03-06 NOTE — Progress Notes (Signed)
 PHARMACY CONSULT NOTE - FOLLOW UP  Pharmacy Consult for Electrolyte Monitoring and Replacement   Recent Labs: Potassium (mmol/L)  Date Value  03/06/2024 3.1 (L)   Magnesium (mg/dL)  Date Value  62/95/2841 2.2   Calcium (mg/dL)  Date Value  32/44/0102 8.6 (L)   Albumin (g/dL)  Date Value  72/53/6644 3.8   Phosphorus (mg/dL)  Date Value  03/47/4259 2.6   Sodium (mmol/L)  Date Value  03/06/2024 132 (L)     Assessment: 68 y/o male with h/o chronic hyponatremia, etoh abuse, HTN, COPD and CVA who is admitted with volume overload and suspected heart failure. Pharmacy is asked to follow and replace electrolytes  Diuretics: furosemide  40 mg IV BID, spironolactone 25 mg po daily  Goal of Therapy:  Electrolytes WNL  Plan:  ---40 mEq po KCl x 2 ---recheck electrolytes in am  Michael Padilla ,PharmD Clinical Pharmacist 03/06/2024 7:06 AM

## 2024-03-06 NOTE — Progress Notes (Signed)
 Patient with increased work of breathing, RR 24-26, 02 SATs 89-90% on 4L nasal cannula.  Patient reports needing to cough of sputum but can't.  Dr. Broadus Canes notified and order placed for flutter valve and PRN order for metoprolol .  Nurse will continue to monitor.

## 2024-03-06 NOTE — Telephone Encounter (Signed)
 Patient Product/process development scientist completed.    The patient is insured through [employer]. Patient has Medicare and is not eligible for a copay card, but may be able to apply for patient assistance or Medicare RX Payment Plan (Patient Must reach out to their plan, if eligible for payment plan), if available.    Ran test claim for Entresto 24-26 mg and the current 30 day co-pay is $177.64.  Ran test claim for Farxiga 10 mg and the current 30 day co-pay is $151.05.  Ran test claim for Jardiance 10 mg and the current 30 day co-pay is $158.54  This test claim was processed through [employer]- copay amounts may vary at other pharmacies due to [employer], or as the patient moves through the different stages of their insurance plan.     Morgan Arab, CPHT Pharmacy Technician III Certified Patient Advocate [facility] Pharmacy Patient Advocate Team Direct Number: [phone]  Fax: [phone]

## 2024-03-06 NOTE — Evaluation (Signed)
 Occupational Therapy Evaluation Patient Details Name: ZYRUS BROCKHOFF MRN: [id number] DOB: [birth year] Today's Date: 03/06/2024   History of Present Illness   Pt is a 68 y/o M admitted on 03/03/24 after presenting with c/o increased work of breathing & SOB. Pt reports drinking 4-5 40oz beers/day & smoking 1-2 packs/day. Pt supposed to use 2L O2 chronically but has been noncompliant for several months. Pt is being treated for hyponatremia, acute on chronic respiratory failure with hypoxia, acute on chronic HFmrEF, & acute nonischemic MI.  PMH: EtOH abuse, COPD, arthritis, CVA, HTN, vitamin D  & B12 deficiencies     Clinical Impressions Patient presenting with decreased Ind in self care,balance, functional mobility/transfers, endurance, and safety awareness. Patient reports being Ind at baseline and living at home with son. Pt endorses being sedentary with activities at home but does not speak of alcohol use. Pt performing functional mobility and self care tasks with supervision - CGA without use of AD. Pt is on 4Ls during evaluation. Chart review states that pt is on O2 at baseline and he reports he does not use at home. Patient will benefit from acute OT to increase overall independence in the areas of ADLs, functional mobility, and safety awareness in order to safely discharge.      If plan is discharge home, recommend the following:   A little help with walking and/or transfers;A little help with bathing/dressing/bathroom;Assistance with cooking/housework;Assist for transportation;Help with stairs or ramp for entrance     Functional Status Assessment   Patient has had a recent decline in their functional status and demonstrates the ability to make significant improvements in function in a reasonable and predictable amount of time.     Equipment Recommendations   None recommended by OT      Precautions/Restrictions   Precautions Precautions: Fall     Mobility Bed  Mobility Overal bed mobility: Needs Assistance Bed Mobility: Supine to Sit     Supine to sit: Contact guard          Transfers Overall transfer level: Needs assistance Equipment used: 1 person hand held assist Transfers: Sit to/from Stand, Bed to chair/wheelchair/BSC Sit to Stand: Supervision     Step pivot transfers: Contact guard assist            Balance Overall balance assessment: Needs assistance Sitting-balance support: Feet supported, Bilateral upper extremity supported Sitting balance-Leahy Scale: Good     Standing balance support: No upper extremity supported Standing balance-Leahy Scale: Fair                             ADL either performed or assessed with clinical judgement   ADL Overall ADL's : Needs assistance/impaired                     Lower Body Dressing: Minimal assistance;Sitting/lateral leans   Toilet Transfer: Contact guard assist Toilet Transfer Details (indicate cue type and reason): simulated transfer                 Vision Patient Visual Report: No change from baseline              Pertinent Vitals/Pain Pain Assessment Pain Assessment: No/denies pain     Extremity/Trunk Assessment Upper Extremity Assessment Upper Extremity Assessment: Overall WFL for tasks assessed   Lower Extremity Assessment Lower Extremity Assessment: Generalized weakness       Communication Communication Communication: No apparent difficulties   Cognition Arousal: Alert  Behavior During Therapy: WFL for tasks assessed/performed Cognition: No apparent impairments                               Following commands: Intact       Cueing  General Comments   Cueing Techniques: Verbal cues              Home Living Family/patient expects to be discharged to:: Private residence Living Arrangements: Children (son) Available Help at Discharge: Family;Available PRN/intermittently Type of Home:  Apartment Home Access: Level entry     Home Layout: One level     Bathroom Shower/Tub: Tub/shower unit;Sponge bathes at baseline   Bathroom Toilet: Standard Bathroom Accessibility: Yes   Home Equipment: Agricultural consultant (2 wheels);Cane - single point   Additional Comments: Per chart, pt supposed to use 2L/min O2 at baseline but when asked about using supplemental O2, pt states "I don't need it".      Prior Functioning/Environment Prior Level of Function : Independent/Modified Independent             Mobility Comments: Reports he's ambulatory without AD, denies falls. ADLs Comments: Pt reports being Ind at baseline with self care tasks. Pt does not drive. Son assists with appointments and IADLs.    OT Problem List: Decreased strength;Decreased range of motion;Decreased cognition;Decreased activity tolerance;Decreased safety awareness;Impaired balance (sitting and/or standing);Decreased knowledge of use of DME or AE   OT Treatment/Interventions: Self-care/ADL training;Therapeutic exercise;Therapeutic activities;Energy conservation;DME and/or AE instruction;Patient/family education;Balance training      OT Goals(Current goals can be found in the care plan section)   Acute Rehab OT Goals Patient Stated Goal: to go home OT Goal Formulation: With patient Time For Goal Achievement: 03/20/24 Potential to Achieve Goals: Fair ADL Goals Pt Will Perform Grooming: Independently;standing Pt Will Perform Lower Body Dressing: Independently;sit to/from stand Pt Will Transfer to Toilet: Independently;ambulating Pt Will Perform Toileting - Clothing Manipulation and hygiene: Independently;sit to/from stand   OT Frequency:  Min 2X/week    Co-evaluation   Reason for Co-Treatment:  (elevated HR, unsure pt can medically tolerate 2 seperate OOB sessions) PT goals addressed during session: Mobility/safety with mobility;Balance        AM-PAC OT "6 Clicks" Daily Activity     Outcome  Measure Help from another person eating meals?: None Help from another person taking care of personal grooming?: None Help from another person toileting, which includes using toliet, bedpan, or urinal?: A Little Help from another person bathing (including washing, rinsing, drying)?: A Little Help from another person to put on and taking off regular upper body clothing?: A Little Help from another person to put on and taking off regular lower body clothing?: A Little 6 Click Score: 20   End of Session Equipment Utilized During Treatment: Oxygen  Nurse Communication: Mobility status  Activity Tolerance: Patient tolerated treatment well Patient left: in bed;with call bell/phone within reach;with bed alarm set  OT Visit Diagnosis: Unsteadiness on feet (R26.81);Repeated falls (R29.6);Muscle weakness (generalized) (M62.81)                Time: 4098-1191 OT Time Calculation (min): 10 min Charges:  OT General Charges $OT Visit: 1 Visit OT Evaluation $OT Eval Low Complexity: 1 Low  George Kinder, MS, OTR/L , CBIS ascom [phone]  03/06/24, 1:10 PM

## 2024-03-06 NOTE — Progress Notes (Addendum)
 [facility] [place], [place] 03/06/24  Subjective:   Hospital day # 2 Following patient for acute on chronic hyponatremia.  He has a history of alcohol abuse and COPD.  Patient is seen sitting up in chair Mostly completed breakfast tray at bedside Remains on 3 L nasal cannula Denies shortness of breath or chest pain Dependent lower extremity edema noted  Sodium 132 Urine output 2.7 L in previous 24 hours    Objective:  Vital signs in last 24 hours:  Temp:  [97.9 F (36.6 C)-98.6 F (37 C)] 98.5 F (36.9 C) (05/14 0800) Pulse Rate:  [64-122] 122 (05/14 0900) Resp:  [17-31] 22 (05/14 0900) BP: (104-151)/(66-114) 136/108 (05/14 0900) SpO2:  [87 %-100 %] 95 % (05/14 0900) Weight:  [63.6 kg] 63.6 kg (05/14 0500)  Weight change: -0.8 kg Filed Weights   03/04/24 0514 03/05/24 0400 03/06/24 0500  Weight: 69.9 kg 64.4 kg 63.6 kg    Intake/Output:    Intake/Output Summary (Last 24 hours) at 03/06/2024 1038 Last data filed at 03/06/2024 0800 Gross per 24 hour  Intake 743.52 ml  Output 3100 ml  Net -2356.48 ml     Physical Exam: General: Laying in the bed, no acute distress  HEENT Moist mucous membranes  Pulm/lungs Normal breathing effort, [place] O2, decreased breath sounds at bases  CVS/Heart Irregular, tachycardic  Abdomen:  Soft, nontender, nondistended  Extremities: 1+ pitting edema bilaterally  Neurologic: Alert, able to answer questions appropriately  Skin: Warm dry          Basic Metabolic Panel:  Recent Labs  Lab 03/03/24 0822 03/03/24 1054 03/04/24 0837 03/04/24 1144 03/05/24 0419 03/05/24 0747 03/05/24 1559 03/05/24 2046 03/06/24 0004 03/06/24 0402 03/06/24 0834  NA 116*   < > 121*   < > 126*   < > 130* 131* 132* 132* 133*  K 4.5  --  4.0  --  3.5  --   --   --   --  3.1*  --   CL 80*  --  84*  --  92*  --   --   --   --  93*  --   CO2 24  --  27  --  30  --   --   --   --  32  --   GLUCOSE 78  --  141*  --  130*  --   --    --   --  110*  --   BUN <5*  --  15  --  18  --   --   --   --  24*  --   CREATININE 0.46*  --  0.75  --  0.52*  --   --   --   --  0.66  --   CALCIUM 8.6*  --  8.2*  --  8.5*  --   --   --   --  8.6*  --   MG  --   --   --   --  2.3  --   --   --   --  2.2  --   PHOS  --   --   --   --  3.2  --   --   --   --  2.6  --    < > = values in this interval not displayed.     CBC: Recent Labs  Lab 03/03/24 0822 03/04/24 0219 03/05/24 0419 03/06/24 0402  WBC 8.7  8.3 16.3* 15.7*  NEUTROABS  --   --   --  13.1*  HGB 15.4 14.1 14.7 14.2  HCT 44.8 39.8 42.3 42.1  MCV 88.9 87.5 89.6 91.5  PLT 729* 663* 616* 588*     No results found for: "HEPBSAG", "HEPBSAB", "HEPBIGM"    Microbiology:  Recent Results (from the past 240 hours)  Resp panel by RT-PCR (RSV, Flu A&B, Covid) Anterior Nasal Swab     Status: None   Collection Time: 03/03/24  8:27 AM   Specimen: Anterior Nasal Swab  Result Value Ref Range Status   SARS Coronavirus 2 by RT PCR NEGATIVE NEGATIVE Final    Comment: (NOTE) SARS-CoV-2 target nucleic acids are NOT DETECTED.  The SARS-CoV-2 RNA is generally detectable in upper respiratory specimens during the acute phase of infection. The lowest concentration of SARS-CoV-2 viral copies this assay can detect is 138 copies/mL. A negative result does not preclude SARS-Cov-2 infection and should not be used as the sole basis for treatment or other patient management decisions. A negative result may occur with  improper specimen collection/handling, submission of specimen other than nasopharyngeal swab, presence of viral mutation(s) within the areas targeted by this assay, and inadequate number of viral copies(<138 copies/mL). A negative result must be combined with clinical observations, patient history, and epidemiological information. The expected result is Negative.  Fact Sheet for Patients:  BloggerCourse.com  Fact Sheet for Healthcare Providers:   SeriousBroker.it  This test is no t yet approved or cleared by the [place]  FDA and  has been authorized for detection and/or diagnosis of SARS-CoV-2 by FDA under an Emergency Use Authorization (EUA). This EUA will remain  in effect (meaning this test can be used) for the duration of the COVID-19 declaration under Section 564(b)(1) of the Act, 21 U.S.C.section 360bbb-3(b)(1), unless the authorization is terminated  or revoked sooner.       Influenza A by PCR NEGATIVE NEGATIVE Final   Influenza B by PCR NEGATIVE NEGATIVE Final    Comment: (NOTE) The Xpert Xpress SARS-CoV-2/FLU/RSV plus assay is intended as an aid in the diagnosis of influenza from Nasopharyngeal swab specimens and should not be used as a sole basis for treatment. Nasal washings and aspirates are unacceptable for Xpert Xpress SARS-CoV-2/FLU/RSV testing.  Fact Sheet for Patients: BloggerCourse.com  Fact Sheet for Healthcare Providers: SeriousBroker.it  This test is not yet approved or cleared by the [place]  FDA and has been authorized for detection and/or diagnosis of SARS-CoV-2 by FDA under an Emergency Use Authorization (EUA). This EUA will remain in effect (meaning this test can be used) for the duration of the COVID-19 declaration under Section 564(b)(1) of the Act, 21 U.S.C. section 360bbb-3(b)(1), unless the authorization is terminated or revoked.     Resp Syncytial Virus by PCR NEGATIVE NEGATIVE Final    Comment: (NOTE) Fact Sheet for Patients: BloggerCourse.com  Fact Sheet for Healthcare Providers: SeriousBroker.it  This test is not yet approved or cleared by the [place]  FDA and has been authorized for detection and/or diagnosis of SARS-CoV-2 by FDA under an Emergency Use Authorization (EUA). This EUA will remain in effect (meaning this test can be used) for  the duration of the COVID-19 declaration under Section 564(b)(1) of the Act, 21 U.S.C. section 360bbb-3(b)(1), unless the authorization is terminated or revoked.  Performed at [facility], [address] [place], [place] [address]   MRSA Next Gen by PCR, Nasal     Status: None   Collection Time: 03/04/24  6:40 AM   Specimen: Nasal Mucosa; Nasal Swab  Result Value Ref Range Status   MRSA by PCR Next Gen NOT DETECTED NOT DETECTED Final    Comment: (NOTE) The GeneXpert MRSA Assay (FDA approved for NASAL specimens only), is one component of a comprehensive MRSA colonization surveillance program. It is not intended to diagnose MRSA infection nor to guide or monitor treatment for MRSA infections. Test performance is not FDA approved in patients less than 9 years old. Performed at [facility], [address] [place], [place] [address]     Coagulation Studies: No results for input(s): "LABPROT", "INR" in the last 72 hours.  Urinalysis: No results for input(s): "COLORURINE", "LABSPEC", "PHURINE", "GLUCOSEU", "HGBUR", "BILIRUBINUR", "KETONESUR", "PROTEINUR", "UROBILINOGEN", "NITRITE", "LEUKOCYTESUR" in the last 72 hours.  Invalid input(s): "APPERANCEUR"    Imaging: No results found.    Medications:    thiamine  (VITAMIN B1) injection 250 mg (03/06/24 0826)    Chlorhexidine  Gluconate Cloth  6 each Topical Daily   enoxaparin  (LOVENOX ) injection  40 mg Subcutaneous Q24H   feeding supplement  237 mL Oral TID BM   folic acid   1 mg Oral Daily   furosemide   40 mg Intravenous Q12H   ipratropium-albuterol   3 mL Nebulization TID   losartan  12.5 mg Oral Daily   [START ON 03/07/2024] metoprolol  succinate  25 mg Oral Daily   multivitamin with minerals  1 tablet Oral Daily   nicotine   21 mg Transdermal Daily   potassium chloride   40 mEq Oral Q4H   predniSONE   40 mg Oral Q breakfast   sodium chloride  flush  3 mL Intravenous Q12H   spironolactone  25 mg Oral Daily    [START ON 03/07/2024] thiamine  (VITAMIN B1) injection  100 mg Intravenous Q24H   acetaminophen , albuterol , ondansetron  **OR** ondansetron  (ZOFRAN ) IV, sodium chloride  flush  Assessment/ Plan:  68 y.o. male with  medical problems of  alcohol abuse, COPD, arthritis, history of stroke, hypertension, nutritional deficiencies including vitamin D , B12.  admitted on 03/03/2024 for Hyponatremia [E87.1] Dyspnea, unspecified type [R06.00]  #Hyponatremia Likely multifactorial.  Patient has underlying COPD and Hx of etoh abuse which may be the cause of chronic hyponatremia. Acute worsening could be secondary to alcohol abuse and volume overload.  Plan: - Serum sodium 132 today. - Continue high-protein drinks such as Ensure twice a day. - Continue furosemide  40 mg IV twice a day for 1 to 2 days until volume is optimized. - Agree with cardiology team regarding starting Farxiga    # Hypokalemia - replacement as per primary team  # LE Edema - getting iv furosemide  with good response - consider transitioning to oral Torsemide 10-20 mg daily in 1-2 days    LOS: 2 Anola King 5/14/202510:38 AM  [facility] [place], [place] [phone]  Patient was seen and examined with Anola King, NP.  Plan of care was formulated for the problems addressed and discussed with NP.  I agree with the note as documented except as noted below.

## 2024-03-06 NOTE — Progress Notes (Signed)
 PROGRESS NOTE    Michael Padilla  GNF:[id number] DOB: 02-05-1956 DOA: 03/03/2024 PCP: Pcp, No    Assessment & Plan:   Principal Problem:   Hyponatremia Active Problems:   Acute on chronic respiratory failure with hypoxia (HCC)   Alcohol abuse   Generalized weakness   Essential hypertension   COPD (chronic obstructive pulmonary disease) (HCC)   Elevated troponin  Assessment and Plan: Hyponatremia: likely secondary to volume/fluid overload from CHF & beer potomania. Na level is trending up. S/p 3% saline. Nephro following and recs apprec    Acute on chronic respiratory hypoxic failure: likely secondary to CHF exacerbation. Continue on supplemental oxygen  and wean back to baseline as tolerated    Acute on chronic systolic CHF: continue on IV lasix . Monitor I/Os. Contineu on metoprolol , losartan, aldactone as per cardio. Cardio following and recs apprec   Alcohol abuse: received alcohol cessation counseling already. At risk for alcohol w/drawl   Acute nonischemic myocardial injury: w/ elevated troponins. Likely secondary to demand ischemia    COPD exacerbation: continue on bronchodilators & steroids. Encourage incentive spirometry    HTN: continue on metoprolol , losartan, aldactone    Generalized weakness: PT/OT consulted   Severe malnutrition: continue w/ nutritional supplements       DVT prophylaxis: lovenox   Code Status: full  Family Communication:  Disposition Plan: depends on PT/OT recs   Level of care: Stepdown  Status is: Inpatient Remains inpatient appropriate because: severity of illness    Consultants:  Cardio Nephro   Procedures:  Antimicrobials:   Subjective: Pt c/o shortness of breath   Objective: Vitals:   03/06/24 0400 03/06/24 0500 03/06/24 0600 03/06/24 0800  BP: 134/83 (!) 127/99 (!) 137/90   Pulse: (!) 104 91 96   Resp: (!) 21 (!) 24 19   Temp:    98.5 F (36.9 C)  TempSrc:    Oral  SpO2: 98% 99% 92%   Weight:  63.6 kg     Height:        Intake/Output Summary (Last 24 hours) at 03/06/2024 0851 Last data filed at 03/06/2024 0800 Gross per 24 hour  Intake 1086.02 ml  Output 3100 ml  Net -2013.98 ml   Filed Weights   03/04/24 0514 03/05/24 0400 03/06/24 0500  Weight: 69.9 kg 64.4 kg 63.6 kg    Examination:  General exam: Appears calm and comfortable  Respiratory system: diminished breath sounds b/l  Cardiovascular system: S1 & S2+. No  rubs, gallops or clicks.  Gastrointestinal system: Abdomen is nondistended, soft and nontender.. Normal bowel sounds heard. Central nervous system: Alert and awake. Moves all extremities Psychiatry: Judgement and insight appears at baseline. Flat mood and affect    Data Reviewed: I have personally reviewed following labs and imaging studies  CBC: Recent Labs  Lab 03/03/24 0822 03/04/24 0219 03/05/24 0419 03/06/24 0402  WBC 8.7 8.3 16.3* 15.7*  NEUTROABS  --   --   --  13.1*  HGB 15.4 14.1 14.7 14.2  HCT 44.8 39.8 42.3 42.1  MCV 88.9 87.5 89.6 91.5  PLT 729* 663* 616* 588*   Basic Metabolic Panel: Recent Labs  Lab 03/03/24 0822 03/03/24 1054 03/04/24 0837 03/04/24 1144 03/05/24 0419 03/05/24 0747 03/05/24 1149 03/05/24 1559 03/05/24 2046 03/06/24 0004 03/06/24 0402  NA 116*   < > 121*   < > 126*   < > 130* 130* 131* 132* 132*  K 4.5  --  4.0  --  3.5  --   --   --   --   --  3.1*  CL 80*  --  84*  --  92*  --   --   --   --   --  93*  CO2 24  --  27  --  30  --   --   --   --   --  32  GLUCOSE 78  --  141*  --  130*  --   --   --   --   --  110*  BUN <5*  --  15  --  18  --   --   --   --   --  24*  CREATININE 0.46*  --  0.75  --  0.52*  --   --   --   --   --  0.66  CALCIUM 8.6*  --  8.2*  --  8.5*  --   --   --   --   --  8.6*  MG  --   --   --   --  2.3  --   --   --   --   --  2.2  PHOS  --   --   --   --  3.2  --   --   --   --   --  2.6   < > = values in this interval not displayed.   GFR: Estimated Creatinine Clearance: 79.5 mL/min  (by C-G formula based on SCr of 0.66 mg/dL). Liver Function Tests: Recent Labs  Lab 03/03/24 0822  AST 48*  ALT 27  ALKPHOS 88  BILITOT 1.7*  PROT 7.0  ALBUMIN 3.8   No results for input(s): "LIPASE", "AMYLASE" in the last 168 hours. No results for input(s): "AMMONIA" in the last 168 hours. Coagulation Profile: No results for input(s): "INR", "PROTIME" in the last 168 hours. Cardiac Enzymes: No results for input(s): "CKTOTAL", "CKMB", "CKMBINDEX", "TROPONINI" in the last 168 hours. BNP (last 3 results) No results for input(s): "PROBNP" in the last 8760 hours. HbA1C: No results for input(s): "HGBA1C" in the last 72 hours. CBG: Recent Labs  Lab 03/04/24 0621  GLUCAP 158*   Lipid Profile: No results for input(s): "CHOL", "HDL", "LDLCALC", "TRIG", "CHOLHDL", "LDLDIRECT" in the last 72 hours. Thyroid  Function Tests: Recent Labs    03/03/24 1913  TSH 1.065   Anemia Panel: Recent Labs    03/05/24 0419  VITAMINB12 654   Sepsis Labs: No results for input(s): "PROCALCITON", "LATICACIDVEN" in the last 168 hours.  Recent Results (from the past 240 hours)  Resp panel by RT-PCR (RSV, Flu A&B, Covid) Anterior Nasal Swab     Status: None   Collection Time: 03/03/24  8:27 AM   Specimen: Anterior Nasal Swab  Result Value Ref Range Status   SARS Coronavirus 2 by RT PCR NEGATIVE NEGATIVE Final    Comment: (NOTE) SARS-CoV-2 target nucleic acids are NOT DETECTED.  The SARS-CoV-2 RNA is generally detectable in upper respiratory specimens during the acute phase of infection. The lowest concentration of SARS-CoV-2 viral copies this assay can detect is 138 copies/mL. A negative result does not preclude SARS-Cov-2 infection and should not be used as the sole basis for treatment or other patient management decisions. A negative result may occur with  improper specimen collection/handling, submission of specimen other than nasopharyngeal swab, presence of viral mutation(s) within  the areas targeted by this assay, and inadequate number of viral copies(<138 copies/mL). A negative result must be combined with clinical observations, patient history, and  epidemiological information. The expected result is Negative.  Fact Sheet for Patients:  BloggerCourse.com  Fact Sheet for Healthcare Providers:  SeriousBroker.it  This test is no t yet approved or cleared by the [place]  FDA and  has been authorized for detection and/or diagnosis of SARS-CoV-2 by FDA under an Emergency Use Authorization (EUA). This EUA will remain  in effect (meaning this test can be used) for the duration of the COVID-19 declaration under Section 564(b)(1) of the Act, 21 U.S.C.section 360bbb-3(b)(1), unless the authorization is terminated  or revoked sooner.       Influenza A by PCR NEGATIVE NEGATIVE Final   Influenza B by PCR NEGATIVE NEGATIVE Final    Comment: (NOTE) The Xpert Xpress SARS-CoV-2/FLU/RSV plus assay is intended as an aid in the diagnosis of influenza from Nasopharyngeal swab specimens and should not be used as a sole basis for treatment. Nasal washings and aspirates are unacceptable for Xpert Xpress SARS-CoV-2/FLU/RSV testing.  Fact Sheet for Patients: BloggerCourse.com  Fact Sheet for Healthcare Providers: SeriousBroker.it  This test is not yet approved or cleared by the [place]  FDA and has been authorized for detection and/or diagnosis of SARS-CoV-2 by FDA under an Emergency Use Authorization (EUA). This EUA will remain in effect (meaning this test can be used) for the duration of the COVID-19 declaration under Section 564(b)(1) of the Act, 21 U.S.C. section 360bbb-3(b)(1), unless the authorization is terminated or revoked.     Resp Syncytial Virus by PCR NEGATIVE NEGATIVE Final    Comment: (NOTE) Fact Sheet for  Patients: BloggerCourse.com  Fact Sheet for Healthcare Providers: SeriousBroker.it  This test is not yet approved or cleared by the [place]  FDA and has been authorized for detection and/or diagnosis of SARS-CoV-2 by FDA under an Emergency Use Authorization (EUA). This EUA will remain in effect (meaning this test can be used) for the duration of the COVID-19 declaration under Section 564(b)(1) of the Act, 21 U.S.C. section 360bbb-3(b)(1), unless the authorization is terminated or revoked.  Performed at [facility], [address] [place], [place] [address]   MRSA Next Gen by PCR, Nasal     Status: None   Collection Time: 03/04/24  6:40 AM   Specimen: Nasal Mucosa; Nasal Swab  Result Value Ref Range Status   MRSA by PCR Next Gen NOT DETECTED NOT DETECTED Final    Comment: (NOTE) The GeneXpert MRSA Assay (FDA approved for NASAL specimens only), is one component of a comprehensive MRSA colonization surveillance program. It is not intended to diagnose MRSA infection nor to guide or monitor treatment for MRSA infections. Test performance is not FDA approved in patients less than 62 years old. Performed at [facility], [address], [place], [place] [address]          Radiology Studies: No results found.      Scheduled Meds:  Chlorhexidine  Gluconate Cloth  6 each Topical Daily   enoxaparin  (LOVENOX ) injection  40 mg Subcutaneous Q24H   feeding supplement  237 mL Oral TID BM   folic acid   1 mg Oral Daily   furosemide   40 mg Intravenous Q12H   ipratropium-albuterol   3 mL Nebulization TID   lisinopril  2.5 mg Oral Daily   metoprolol  succinate  12.5 mg Oral Daily   multivitamin with minerals  1 tablet Oral Daily   nicotine   21 mg Transdermal Daily   potassium chloride   40 mEq Oral Q4H   predniSONE   40 mg Oral Q breakfast   sodium  chloride flush  3 mL Intravenous Q12H   spironolactone  25 mg  Oral Daily   [START ON 03/07/2024] thiamine  (VITAMIN B1) injection  100 mg Intravenous Q24H   Continuous Infusions:  thiamine  (VITAMIN B1) injection 250 mg (03/06/24 0826)     LOS: 2 days      Alphonsus Jeans, MD [facility] Pager [phone]  If 7PM-7AM, please contact night-coverage [email or web address] 03/06/2024, 8:51 AM

## 2024-03-07 DIAGNOSIS — E871 Hypo-osmolality and hyponatremia: Secondary | ICD-10-CM | POA: Diagnosis not present

## 2024-03-07 LAB — CBC
HCT: 42.4 % (ref 39.0–52.0)
Hemoglobin: 14.5 g/dL (ref 13.0–17.0)
MCH: 31.2 pg (ref 26.0–34.0)
MCHC: 34.2 g/dL (ref 30.0–36.0)
MCV: 91.2 fL (ref 80.0–100.0)
Platelets: 597 10*3/uL — ABNORMAL HIGH (ref 150–400)
RBC: 4.65 MIL/uL (ref 4.22–5.81)
RDW: 13.8 % (ref 11.5–15.5)
WBC: 11.5 10*3/uL — ABNORMAL HIGH (ref 4.0–10.5)
nRBC: 0 % (ref 0.0–0.2)

## 2024-03-07 LAB — BASIC METABOLIC PANEL WITH GFR
Anion gap: 8 (ref 5–15)
BUN: 28 mg/dL — ABNORMAL HIGH (ref 8–23)
CO2: 34 mmol/L — ABNORMAL HIGH (ref 22–32)
Calcium: 9.1 mg/dL (ref 8.9–10.3)
Chloride: 93 mmol/L — ABNORMAL LOW (ref 98–111)
Creatinine, Ser: 0.68 mg/dL (ref 0.61–1.24)
GFR, Estimated: >60 mL/min (ref >60)
Glucose, Bld: 113 mg/dL — ABNORMAL HIGH (ref 70–99)
Potassium: 4.1 mmol/L (ref 3.5–5.1)
Sodium: 135 mmol/L (ref 135–145)

## 2024-03-07 LAB — MAGNESIUM: Magnesium: 2.2 mg/dL (ref 1.7–2.4)

## 2024-03-07 LAB — PHOSPHORUS: Phosphorus: 2.5 mg/dL (ref 2.5–4.6)

## 2024-03-07 MED ORDER — LOSARTAN POTASSIUM 50 MG PO TABS
25.0000 mg | ORAL_TABLET | Freq: Every day | ORAL | Status: DC
  Administered 2024-03-07: 25 mg via ORAL
  Filled 2024-03-07: qty 1

## 2024-03-07 MED ORDER — ALPRAZOLAM 0.25 MG PO TABS
0.2500 mg | ORAL_TABLET | Freq: Once | ORAL | Status: AC
  Administered 2024-03-07: 0.25 mg via ORAL
  Filled 2024-03-07: qty 1

## 2024-03-07 MED ORDER — THIAMINE MONONITRATE 100 MG PO TABS
100.0000 mg | ORAL_TABLET | Freq: Every day | ORAL | Status: DC
  Administered 2024-03-08 – 2024-03-11 (×4): 100 mg via ORAL
  Filled 2024-03-07 (×4): qty 1

## 2024-03-07 MED ORDER — METOPROLOL SUCCINATE ER 50 MG PO TB24
50.0000 mg | ORAL_TABLET | Freq: Every day | ORAL | Status: DC
  Administered 2024-03-07: 50 mg via ORAL
  Filled 2024-03-07: qty 1

## 2024-03-07 MED ORDER — FUROSEMIDE 40 MG PO TABS
40.0000 mg | ORAL_TABLET | Freq: Two times a day (BID) | ORAL | Status: DC
  Administered 2024-03-07 – 2024-03-11 (×9): 40 mg via ORAL
  Filled 2024-03-07: qty 1
  Filled 2024-03-07: qty 2
  Filled 2024-03-07 (×5): qty 1
  Filled 2024-03-07 (×2): qty 2

## 2024-03-07 MED ORDER — METOPROLOL SUCCINATE ER 50 MG PO TB24: 50.0000 mg | ORAL_TABLET | Freq: Every day | ORAL | Status: DC

## 2024-03-07 MED ORDER — THIAMINE MONONITRATE 100 MG PO TABS
250.0000 mg | ORAL_TABLET | Freq: Three times a day (TID) | ORAL | Status: AC
  Administered 2024-03-07 (×2): 250 mg via ORAL
  Filled 2024-03-07 (×2): qty 3

## 2024-03-07 MED ORDER — ORAL CARE MOUTH RINSE: 15.0000 mL | OROMUCOSAL | Status: DC | PRN

## 2024-03-07 MED ORDER — METOPROLOL SUCCINATE ER 50 MG PO TB24: 25.0000 mg | ORAL_TABLET | Freq: Every day | ORAL | Status: DC

## 2024-03-07 NOTE — Progress Notes (Signed)
 Occupational Therapy Treatment Patient Details Name: Michael Padilla MRN: [id number] DOB: Jan 02, 1956 Today's Date: 03/07/2024   History of present illness Pt is a 68 y/o M admitted on 03/03/24 after presenting with c/o increased work of breathing & SOB. Pt reports drinking 4-5 40oz beers/day & smoking 1-2 packs/day. Pt supposed to use 2L O2 chronically but has been noncompliant for several months. Pt is being treated for hyponatremia, acute on chronic respiratory failure with hypoxia, acute on chronic HFmrEF, & acute nonischemic MI.  PMH: EtOH abuse, COPD, arthritis, CVA, HTN, vitamin D  & B12 deficiencies   OT comments  Chart reviewed to date, pt greeted in bed, agreeable to OT tx session targeting improving functional activity tolerance in order to facilitate improved ADL performance. Pt is appears mildly anxious throughout. Improvements noted in overall activity tolerance with pt amb approx 10' in room with RW. Pt on 4 L via [place] and spo2 down to 82% while amb, recovers to >88% with PLB and seated rest break; pt reports no SOB. HR to 150s bpm max during session. Educated patient regarding energy conservation techniques and ease of ADL completion with DME (I.e but bench as he does not shower anymore due to having a tub/shower and falling in the shower- his shower chair has all four legs in the shower so he still has to step over the tub threshold). All questions answered within scope. Pt is left in chair, all needs met. Discharge recommendation remains appropriate, OT will continue to follow       If plan is discharge home, recommend the following:  A little help with walking and/or transfers;A little help with bathing/dressing/bathroom;Assistance with cooking/housework;Assist for transportation;Help with stairs or ramp for entrance   Equipment Recommendations  Tub/shower bench    Recommendations for Other Services      Precautions / Restrictions Precautions Precautions: Fall Recall of  Precautions/Restrictions: Intact Precaution/Restrictions Comments: watch HR, O2 Restrictions Weight Bearing Restrictions Per Provider Order: No       Mobility Bed Mobility Overal bed mobility: Needs Assistance Bed Mobility: Supine to Sit     Supine to sit: Supervision          Transfers Overall transfer level: Needs assistance Equipment used: Rolling walker (2 wheels) Transfers: Sit to/from Stand Sit to Stand: Supervision                 Balance Overall balance assessment: Needs assistance   Sitting balance-Leahy Scale: Good     Standing balance support: Single extremity supported, Bilateral upper extremity supported, Reliant on assistive device for balance Standing balance-Leahy Scale: Good                             ADL either performed or assessed with clinical judgement   ADL Overall ADL's : Needs assistance/impaired Eating/Feeding: Set up;Sitting   Grooming: Set up;Sitting                   Toilet Transfer: Contact guard assist;Rolling walker (2 wheels);Ambulation Toilet Transfer Details (indicate cue type and reason): simulated Toileting- Clothing Manipulation and Hygiene: Moderate assistance;Sit to/from stand Toileting - Clothing Manipulation Details (indicate cue type and reason): for thoroughness after incontinent BM     Functional mobility during ADLs: Rolling walker (2 wheels);Contact guard assist (approx 10' in room)      Extremity/Trunk Assessment              Vision  Perception     Praxis     Communication Communication Communication: No apparent difficulties   Cognition Arousal: Alert Behavior During Therapy: Anxious Cognition: No apparent impairments                               Following commands: Intact        Cueing   Cueing Techniques: Verbal cues  Exercises Other Exercises Other Exercises: edu re: role of OT, role of rehab, safe ADL completion/ energy conservation  techniques with DME    Shoulder Instructions       General Comments Max HR 150 bpm, spo2 82% on 4 L via [place] while amb, above >88% on 4 L after approx 1 minute and PLB; does not report SOB    Pertinent Vitals/ Pain       Pain Assessment Pain Assessment: No/denies pain  Home Living                                          Prior Functioning/Environment              Frequency  Min 2X/week        Progress Toward Goals  OT Goals(current goals can now be found in the care plan section)  Progress towards OT goals: Progressing toward goals  Acute Rehab OT Goals Time For Goal Achievement: 03/20/24  Plan      Co-evaluation                 AM-PAC OT "6 Clicks" Daily Activity     Outcome Measure   Help from another person eating meals?: None Help from another person taking care of personal grooming?: None Help from another person toileting, which includes using toliet, bedpan, or urinal?: A Little Help from another person bathing (including washing, rinsing, drying)?: A Little Help from another person to put on and taking off regular upper body clothing?: A Little Help from another person to put on and taking off regular lower body clothing?: A Little 6 Click Score: 20    End of Session Equipment Utilized During Treatment: Oxygen ;Rolling walker (2 wheels)  OT Visit Diagnosis: Unsteadiness on feet (R26.81);Repeated falls (R29.6);Muscle weakness (generalized) (M62.81)   Activity Tolerance Patient tolerated treatment well   Patient Left in chair;with call bell/phone within reach   Nurse Communication Mobility status (NT re status)        Time: 8295-6213 OT Time Calculation (min): 25 min  Charges: OT General Charges $OT Visit: 1 Visit OT Treatments $Self Care/Home Management : 8-22 mins $Therapeutic Activity: 8-22 mins  Gerre Kraft, OTD OTR/L  03/07/24, 10:38 AM

## 2024-03-07 NOTE — Plan of Care (Signed)

## 2024-03-07 NOTE — Progress Notes (Signed)
 [facility] CLINIC CARDIOLOGY PROGRESS NOTE   Patient ID: Michael Padilla MRN: [id number] DOB/AGE: 1955/11/19 68 y.o.  Admit date: 03/03/2024 Referring Physician Dr. Daisey Dryer Primary Physician Pcp, No Primary Cardiologist None Reason for Consultation Acute heart failure  HPI: Michael Padilla is a 68 y.o. male with a past medical history of hypertension, alcohol use, tobacco use, COPD, hx CVA who presented to the ED on 03/03/2024 for worsening shortness of breath for last 5 days associated with pedal edema and orthopnea. Patient denies chest pain or palpitations.  Interval History: -Patient seen and examined this AM and laying comfortably in hospital bed. Patient states his SOB feels better and denies chest pain or palpitations. LE edema continues to improve. -Patients BP and HR elevated this AM. Overnight Tele showed no significant events.  -Patient diuresing great. UOP yesterday 2.2L, with stable Cr this AM.  -Hyponatremia improved at 135 this AM.  -Patient on 4L [place] with stable SpO2. Patient reports using O2 at home PRN.  Review of systems complete and found to be negative unless listed above    Vitals:   03/07/24 0913 03/07/24 1000 03/07/24 1100 03/07/24 1200  BP: (!) 156/119 134/87 123/89 (!) 143/102  Pulse: (!) 122 (!) 118 (!) 104 (!) 109  Resp:  (!) 26 20 (!) 22  Temp:    98.6 F (37 C)  TempSrc:    Oral  SpO2:  91% 92% 91%  Weight:      Height:         Intake/Output Summary (Last 24 hours) at 03/07/2024 1305 Last data filed at 03/07/2024 0900 Gross per 24 hour  Intake 1642.62 ml  Output 1900 ml  Net -257.38 ml     PHYSICAL EXAM General: chronically ill appearing elderly male, well nourished, in no acute distress. HEENT: Normocephalic and atraumatic. Neck: No JVD.  Lungs: Normal respiratory effort on 4L [place]. Diminished bilaterally. Heart: HRR, elevated rates. Normal S1 and S2 without gallops or murmurs. Radial & DP pulses 2+ bilaterally. Abdomen: Non-distended  appearing.  Msk: Normal strength and tone for age. Extremities: No clubbing, cyanosis, edema Neuro: Alert and oriented X 3. Psych: Mood appropriate, affect congruent.    LABS: Basic Metabolic Panel: Recent Labs    03/06/24 0402 03/06/24 0834 03/06/24 1156 03/07/24 0329  NA 132*   < > 132* 135  K 3.1*  --   --  4.1  CL 93*  --   --  93*  CO2 32  --   --  34*  GLUCOSE 110*  --   --  113*  BUN 24*  --   --  28*  CREATININE 0.66  --   --  0.68  CALCIUM 8.6*  --   --  9.1  MG 2.2  --   --  2.2  PHOS 2.6  --   --  2.5   < > = values in this interval not displayed.   Liver Function Tests: No results for input(s): "AST", "ALT", "ALKPHOS", "BILITOT", "PROT", "ALBUMIN" in the last 72 hours.  No results for input(s): "LIPASE", "AMYLASE" in the last 72 hours. CBC: Recent Labs    03/06/24 0402 03/07/24 0329  WBC 15.7* 11.5*  NEUTROABS 13.1*  --   HGB 14.2 14.5  HCT 42.1 42.4  MCV 91.5 91.2  PLT 588* 597*   Cardiac Enzymes: No results for input(s): "CKTOTAL", "CKMB", "CKMBINDEX", "TROPONINIHS" in the last 72 hours.  BNP: No results for input(s): "BNP" in the last 72 hours.  D-Dimer:  No results for input(s): "DDIMER" in the last 72 hours. Hemoglobin A1C: No results for input(s): "HGBA1C" in the last 72 hours. Fasting Lipid Panel: No results for input(s): "CHOL", "HDL", "LDLCALC", "TRIG", "CHOLHDL", "LDLDIRECT" in the last 72 hours. Thyroid  Function Tests: No results for input(s): "TSH", "T4TOTAL", "T3FREE", "THYROIDAB" in the last 72 hours.  Invalid input(s): "FREET3"  Anemia Panel: Recent Labs    03/05/24 0419  VITAMINB12 654    No results found.    ECHO as above  TELEMETRY reviewed by me 03/07/24: sinus tachycardia, PACs rate 90s  EKG reviewed by me 03/07/24: sinus tachycardia, PACs, RBBB, rate 140 bpm  DATA reviewed by me 03/07/24: last 24h vitals tele labs imaging I/O hospitalist progress note.  Principal Problem:   Hyponatremia Active Problems:    Generalized weakness   Alcohol abuse   Acute on chronic respiratory failure with hypoxia (HCC)   Essential hypertension   COPD (chronic obstructive pulmonary disease) (HCC)   Elevated troponin    ASSESSMENT AND PLAN: Michael Padilla is a 68 y.o. male with a past medical history of hypertension, alcohol use, tobacco use, COPD, hx CVA who presented to the ED on 03/03/2024 for worsening shortness of breath for last 5 days associated with pedal edema and orthopnea. Patient denies chest pain or palpitations.  # Acute Heart Failure mildly reduced EF # Hyponatremia # COPD  # Significant alcohol and tobacco use Patient presents to ED with worsening dyspnea, lower extremity edema and orthopnea. BNP elevated at 1147. EKG in ED with sinus tachycardia with PACs, RBBB rate 140 bpm. 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 Diuresed great yesterday with UOP 2.2 L, with stable Cr this AM. Na improving, 135 this AM. BP and HR elevated this AM -Continue IV lasix  40 mg twice daily. Continue to monitor UOP and renal function closley.  -Keep K > 4 and Mg >2. -Increase Losartan to 25 mg daily. -Increase metoprolol  succinate to 50 mg daily. -Continue spironolactone 25 mg daily.  -Per pharmacy, patient not eligible for copay card. Entresto, Farxiga and Jardiance are expensive. Patient can reach out to their plan, if eligible for payment plan. GDMT optimization limited due to cost of medications.  -Plan for ischemic evaluation outpatient to evaluate new RWMA found on echo. -COPD management per primary.   This patient's case was discussed and created with Dr. Custovic and she is in agreement.  Signed:  Creighton Doffing, PA-C  03/07/2024, 1:05 PM [facility] Cardiology

## 2024-03-07 NOTE — Progress Notes (Signed)
 PROGRESS NOTE    Michael Padilla  ZOX:[id number] DOB: 10-May-1956 DOA: 03/03/2024 PCP: Pcp, No    Assessment & Plan:   Principal Problem:   Hyponatremia Active Problems:   Acute on chronic respiratory failure with hypoxia (HCC)   Alcohol abuse   Generalized weakness   Essential hypertension   COPD (chronic obstructive pulmonary disease) (HCC)   Elevated troponin  Assessment and Plan: Hyponatremia: likely secondary to volume/fluid overload from CHF & beer potomania. Na level is trending up. S/p 3% saline. Back WNL. Nephro signed off 03/07/24   Acute on chronic respiratory hypoxic failure: likely secondary to CHF exacerbation. Continue on supplemental oxygen  and wean as tolerated    Acute on chronic systolic CHF: continue on IV lasix . Monitor I/Os. Increase doses of metoprolol , losartan & continue on aldactone. Cardio following and recs apprec   Alcohol abuse: received alcohol cessation counseling already. At risk for alcohol w/drawl   Acute nonischemic myocardial injury: w/ elevated troponins. Likely secondary to demand ischemia    COPD exacerbation: continue on steroids, bronchodilators. Encourage incentive spirometry    HTN: continue on aldactone & increased doses of losartan, metoprolol      Generalized weakness: PT/OT recs HH   Severe malnutrition: continue w/ nutritional supplements       DVT prophylaxis: lovenox   Code Status: full  Family Communication: called pt's son but no answer & unable to leave a voicemail  Disposition Plan: likely d/c back home w/ HH   Level of care: Telemetry Cardiac  Status is: Inpatient Remains inpatient appropriate because: severity of illness    Consultants:  Cardio Nephro   Procedures:  Antimicrobials:   Subjective: Pt c/o fatigue   Objective: Vitals:   03/07/24 0600 03/07/24 0700 03/07/24 0729 03/07/24 0913  BP: (!) 135/97 (!) 141/101  (!) 156/119  Pulse: (!) 107 (!) 31  (!) 122  Resp: (!) 22 20    Temp:       TempSrc:      SpO2: 91% 92% 93%   Weight:      Height:        Intake/Output Summary (Last 24 hours) at 03/07/2024 0915 Last data filed at 03/07/2024 0600 Gross per 24 hour  Intake 1359.62 ml  Output 1800 ml  Net -440.38 ml   Filed Weights   03/05/24 0400 03/06/24 0500 03/07/24 0500  Weight: 64.4 kg 63.6 kg 61.8 kg    Examination:  General exam: Appears comfortable  Respiratory system: decreased breath sounds b/l  Cardiovascular system: S1/S2+. No rubs or clicks  Gastrointestinal system: Abd is soft, NT, ND & hypoactive bowel sounds Central nervous system: alert & awake. Moves all extremities  Psychiatry: Judgement and insight appears at baseline. Flat mood and affect    Data Reviewed: I have personally reviewed following labs and imaging studies  CBC: Recent Labs  Lab 03/03/24 0822 03/04/24 0219 03/05/24 0419 03/06/24 0402 03/07/24 0329  WBC 8.7 8.3 16.3* 15.7* 11.5*  NEUTROABS  --   --   --  13.1*  --   HGB 15.4 14.1 14.7 14.2 14.5  HCT 44.8 39.8 42.3 42.1 42.4  MCV 88.9 87.5 89.6 91.5 91.2  PLT 729* 663* 616* 588* 597*   Basic Metabolic Panel: Recent Labs  Lab 03/03/24 0822 03/03/24 1054 03/04/24 0837 03/04/24 1144 03/05/24 0419 03/05/24 0747 03/06/24 0004 03/06/24 0402 03/06/24 0834 03/06/24 1156 03/07/24 0329  NA 116*   < > 121*   < > 126*   < > 132* 132* 133* 132* 135  K 4.5  --  4.0  --  3.5  --   --  3.1*  --   --  4.1  CL 80*  --  84*  --  92*  --   --  93*  --   --  93*  CO2 24  --  27  --  30  --   --  32  --   --  34*  GLUCOSE 78  --  141*  --  130*  --   --  110*  --   --  113*  BUN <5*  --  15  --  18  --   --  24*  --   --  28*  CREATININE 0.46*  --  0.75  --  0.52*  --   --  0.66  --   --  0.68  CALCIUM 8.6*  --  8.2*  --  8.5*  --   --  8.6*  --   --  9.1  MG  --   --   --   --  2.3  --   --  2.2  --   --  2.2  PHOS  --   --   --   --  3.2  --   --  2.6  --   --  2.5   < > = values in this interval not displayed.    GFR: Estimated Creatinine Clearance: 77.3 mL/min (by C-G formula based on SCr of 0.68 mg/dL). Liver Function Tests: Recent Labs  Lab 03/03/24 0822  AST 48*  ALT 27  ALKPHOS 88  BILITOT 1.7*  PROT 7.0  ALBUMIN 3.8   No results for input(s): "LIPASE", "AMYLASE" in the last 168 hours. No results for input(s): "AMMONIA" in the last 168 hours. Coagulation Profile: No results for input(s): "INR", "PROTIME" in the last 168 hours. Cardiac Enzymes: No results for input(s): "CKTOTAL", "CKMB", "CKMBINDEX", "TROPONINI" in the last 168 hours. BNP (last 3 results) No results for input(s): "PROBNP" in the last 8760 hours. HbA1C: No results for input(s): "HGBA1C" in the last 72 hours. CBG: Recent Labs  Lab 03/04/24 0621  GLUCAP 158*   Lipid Profile: No results for input(s): "CHOL", "HDL", "LDLCALC", "TRIG", "CHOLHDL", "LDLDIRECT" in the last 72 hours. Thyroid  Function Tests: No results for input(s): "TSH", "T4TOTAL", "FREET4", "T3FREE", "THYROIDAB" in the last 72 hours.  Anemia Panel: Recent Labs    03/05/24 0419  VITAMINB12 654   Sepsis Labs: No results for input(s): "PROCALCITON", "LATICACIDVEN" in the last 168 hours.  Recent Results (from the past 240 hours)  Resp panel by RT-PCR (RSV, Flu A&B, Covid) Anterior Nasal Swab     Status: None   Collection Time: 03/03/24  8:27 AM   Specimen: Anterior Nasal Swab  Result Value Ref Range Status   SARS Coronavirus 2 by RT PCR NEGATIVE NEGATIVE Final    Comment: (NOTE) SARS-CoV-2 target nucleic acids are NOT DETECTED.  The SARS-CoV-2 RNA is generally detectable in upper respiratory specimens during the acute phase of infection. The lowest concentration of SARS-CoV-2 viral copies this assay can detect is 138 copies/mL. A negative result does not preclude SARS-Cov-2 infection and should not be used as the sole basis for treatment or other patient management decisions. A negative result may occur with  improper specimen  collection/handling, submission of specimen other than nasopharyngeal swab, presence of viral mutation(s) within the areas targeted by this assay, and inadequate number of viral copies(<138 copies/mL). A negative  result must be combined with clinical observations, patient history, and epidemiological information. The expected result is Negative.  Fact Sheet for Patients:  BloggerCourse.com  Fact Sheet for Healthcare Providers:  SeriousBroker.it  This test is no t yet approved or cleared by the [place]  FDA and  has been authorized for detection and/or diagnosis of SARS-CoV-2 by FDA under an Emergency Use Authorization (EUA). This EUA will remain  in effect (meaning this test can be used) for the duration of the COVID-19 declaration under Section 564(b)(1) of the Act, 21 U.S.C.section 360bbb-3(b)(1), unless the authorization is terminated  or revoked sooner.       Influenza A by PCR NEGATIVE NEGATIVE Final   Influenza B by PCR NEGATIVE NEGATIVE Final    Comment: (NOTE) The Xpert Xpress SARS-CoV-2/FLU/RSV plus assay is intended as an aid in the diagnosis of influenza from Nasopharyngeal swab specimens and should not be used as a sole basis for treatment. Nasal washings and aspirates are unacceptable for Xpert Xpress SARS-CoV-2/FLU/RSV testing.  Fact Sheet for Patients: BloggerCourse.com  Fact Sheet for Healthcare Providers: SeriousBroker.it  This test is not yet approved or cleared by the [place]  FDA and has been authorized for detection and/or diagnosis of SARS-CoV-2 by FDA under an Emergency Use Authorization (EUA). This EUA will remain in effect (meaning this test can be used) for the duration of the COVID-19 declaration under Section 564(b)(1) of the Act, 21 U.S.C. section 360bbb-3(b)(1), unless the authorization is terminated or revoked.     Resp Syncytial  Virus by PCR NEGATIVE NEGATIVE Final    Comment: (NOTE) Fact Sheet for Patients: BloggerCourse.com  Fact Sheet for Healthcare Providers: SeriousBroker.it  This test is not yet approved or cleared by the [place]  FDA and has been authorized for detection and/or diagnosis of SARS-CoV-2 by FDA under an Emergency Use Authorization (EUA). This EUA will remain in effect (meaning this test can be used) for the duration of the COVID-19 declaration under Section 564(b)(1) of the Act, 21 U.S.C. section 360bbb-3(b)(1), unless the authorization is terminated or revoked.  Performed at [facility], [address] [place], [place] [address]   MRSA Next Gen by PCR, Nasal     Status: None   Collection Time: 03/04/24  6:40 AM   Specimen: Nasal Mucosa; Nasal Swab  Result Value Ref Range Status   MRSA by PCR Next Gen NOT DETECTED NOT DETECTED Final    Comment: (NOTE) The GeneXpert MRSA Assay (FDA approved for NASAL specimens only), is one component of a comprehensive MRSA colonization surveillance program. It is not intended to diagnose MRSA infection nor to guide or monitor treatment for MRSA infections. Test performance is not FDA approved in patients less than 92 years old. Performed at [facility], [address], [place], [place] [address]          Radiology Studies: No results found.      Scheduled Meds:  Chlorhexidine  Gluconate Cloth  6 each Topical Daily   enoxaparin  (LOVENOX ) injection  40 mg Subcutaneous Q24H   feeding supplement  237 mL Oral TID BM   folic acid   1 mg Oral Daily   furosemide   40 mg Oral BID   guaiFENesin   600 mg Oral BID   ipratropium-albuterol   3 mL Nebulization BID   losartan  25 mg Oral Daily   metoprolol  succinate  50 mg Oral Daily   multivitamin with minerals  1 tablet Oral Daily   nicotine   21 mg Transdermal Daily   predniSONE   40 mg Oral Q breakfast   sodium chloride   flush  3 mL Intravenous Q12H   spironolactone  25 mg Oral Daily   [START ON 03/08/2024] thiamine   100 mg Oral Daily   thiamine   250 mg Oral TID   Continuous Infusions:     LOS: 3 days      Alphonsus Jeans, MD [facility] Pager [phone]  If 7PM-7AM, please contact night-coverage [email or web address] 03/07/2024, 9:15 AM

## 2024-03-07 NOTE — Progress Notes (Signed)
 [facility] [place], [place] 03/07/24  Subjective:   Hospital day # 3 Following patient for acute on chronic hyponatremia.  He has a history of alcohol abuse and COPD.  Patient sitting up in bed Completed breakfast tray at bedside No complaints to offer  Sodium 135 Urine output 2.2 L in previous 24 hours    Objective:  Vital signs in last 24 hours:  Temp:  [98 F (36.7 C)-99 F (37.2 C)] 98.4 F (36.9 C) (05/15 0800) Pulse Rate:  [31-122] 122 (05/15 0913) Resp:  [16-35] 20 (05/15 0700) BP: (115-156)/(86-119) 156/119 (05/15 0913) SpO2:  [89 %-95 %] 93 % (05/15 0729) Weight:  [61.8 kg] 61.8 kg (05/15 0500)  Weight change: -1.8 kg Filed Weights   03/05/24 0400 03/06/24 0500 03/07/24 0500  Weight: 64.4 kg 63.6 kg 61.8 kg    Intake/Output:    Intake/Output Summary (Last 24 hours) at 03/07/2024 1226 Last data filed at 03/07/2024 0900 Gross per 24 hour  Intake 1642.62 ml  Output 1900 ml  Net -257.38 ml     Physical Exam: General: Laying in the bed, no acute distress  HEENT Moist mucous membranes  Pulm/lungs Normal breathing effort, [place] O2, decreased breath sounds at bases  CVS/Heart Irregular, tachycardic  Abdomen:  Soft, nontender, nondistended  Extremities: 1+ pitting edema bilaterally  Neurologic: Alert, able to answer questions appropriately  Skin: Warm dry          Basic Metabolic Panel:  Recent Labs  Lab 03/03/24 0822 03/03/24 1054 03/04/24 0837 03/04/24 1144 03/05/24 0419 03/05/24 0747 03/06/24 0004 03/06/24 0402 03/06/24 0834 03/06/24 1156 03/07/24 0329  NA 116*   < > 121*   < > 126*   < > 132* 132* 133* 132* 135  K 4.5  --  4.0  --  3.5  --   --  3.1*  --   --  4.1  CL 80*  --  84*  --  92*  --   --  93*  --   --  93*  CO2 24  --  27  --  30  --   --  32  --   --  34*  GLUCOSE 78  --  141*  --  130*  --   --  110*  --   --  113*  BUN <5*  --  15  --  18  --   --  24*  --   --  28*  CREATININE 0.46*  --  0.75  --  0.52*   --   --  0.66  --   --  0.68  CALCIUM 8.6*  --  8.2*  --  8.5*  --   --  8.6*  --   --  9.1  MG  --   --   --   --  2.3  --   --  2.2  --   --  2.2  PHOS  --   --   --   --  3.2  --   --  2.6  --   --  2.5   < > = values in this interval not displayed.     CBC: Recent Labs  Lab 03/03/24 0822 03/04/24 0219 03/05/24 0419 03/06/24 0402 03/07/24 0329  WBC 8.7 8.3 16.3* 15.7* 11.5*  NEUTROABS  --   --   --  13.1*  --   HGB 15.4 14.1 14.7 14.2 14.5  HCT 44.8 39.8 42.3 42.1 42.4  MCV 88.9 87.5 89.6 91.5 91.2  PLT 729* 663* 616* 588* 597*     No results found for: "HEPBSAG", "HEPBSAB", "HEPBIGM"    Microbiology:  Recent Results (from the past 240 hours)  Resp panel by RT-PCR (RSV, Flu A&B, Covid) Anterior Nasal Swab     Status: None   Collection Time: 03/03/24  8:27 AM   Specimen: Anterior Nasal Swab  Result Value Ref Range Status   SARS Coronavirus 2 by RT PCR NEGATIVE NEGATIVE Final    Comment: (NOTE) SARS-CoV-2 target nucleic acids are NOT DETECTED.  The SARS-CoV-2 RNA is generally detectable in upper respiratory specimens during the acute phase of infection. The lowest concentration of SARS-CoV-2 viral copies this assay can detect is 138 copies/mL. A negative result does not preclude SARS-Cov-2 infection and should not be used as the sole basis for treatment or other patient management decisions. A negative result may occur with  improper specimen collection/handling, submission of specimen other than nasopharyngeal swab, presence of viral mutation(s) within the areas targeted by this assay, and inadequate number of viral copies(<138 copies/mL). A negative result must be combined with clinical observations, patient history, and epidemiological information. The expected result is Negative.  Fact Sheet for Patients:  BloggerCourse.com  Fact Sheet for Healthcare Providers:  SeriousBroker.it  This test is no t yet  approved or cleared by the [place]  FDA and  has been authorized for detection and/or diagnosis of SARS-CoV-2 by FDA under an Emergency Use Authorization (EUA). This EUA will remain  in effect (meaning this test can be used) for the duration of the COVID-19 declaration under Section 564(b)(1) of the Act, 21 U.S.C.section 360bbb-3(b)(1), unless the authorization is terminated  or revoked sooner.       Influenza A by PCR NEGATIVE NEGATIVE Final   Influenza B by PCR NEGATIVE NEGATIVE Final    Comment: (NOTE) The Xpert Xpress SARS-CoV-2/FLU/RSV plus assay is intended as an aid in the diagnosis of influenza from Nasopharyngeal swab specimens and should not be used as a sole basis for treatment. Nasal washings and aspirates are unacceptable for Xpert Xpress SARS-CoV-2/FLU/RSV testing.  Fact Sheet for Patients: BloggerCourse.com  Fact Sheet for Healthcare Providers: SeriousBroker.it  This test is not yet approved or cleared by the [place]  FDA and has been authorized for detection and/or diagnosis of SARS-CoV-2 by FDA under an Emergency Use Authorization (EUA). This EUA will remain in effect (meaning this test can be used) for the duration of the COVID-19 declaration under Section 564(b)(1) of the Act, 21 U.S.C. section 360bbb-3(b)(1), unless the authorization is terminated or revoked.     Resp Syncytial Virus by PCR NEGATIVE NEGATIVE Final    Comment: (NOTE) Fact Sheet for Patients: BloggerCourse.com  Fact Sheet for Healthcare Providers: SeriousBroker.it  This test is not yet approved or cleared by the [place]  FDA and has been authorized for detection and/or diagnosis of SARS-CoV-2 by FDA under an Emergency Use Authorization (EUA). This EUA will remain in effect (meaning this test can be used) for the duration of the COVID-19 declaration under Section 564(b)(1) of  the Act, 21 U.S.C. section 360bbb-3(b)(1), unless the authorization is terminated or revoked.  Performed at [facility], [address] [place], [place] [address]   MRSA Next Gen by PCR, Nasal     Status: None   Collection Time: 03/04/24  6:40 AM   Specimen: Nasal Mucosa; Nasal Swab  Result Value Ref Range Status   MRSA by PCR Next Gen NOT DETECTED NOT  DETECTED Final    Comment: (NOTE) The GeneXpert MRSA Assay (FDA approved for NASAL specimens only), is one component of a comprehensive MRSA colonization surveillance program. It is not intended to diagnose MRSA infection nor to guide or monitor treatment for MRSA infections. Test performance is not FDA approved in patients less than 73 years old. Performed at [facility], [address] [place], [place] [address]     Coagulation Studies: No results for input(s): "LABPROT", "INR" in the last 72 hours.  Urinalysis: No results for input(s): "COLORURINE", "LABSPEC", "PHURINE", "GLUCOSEU", "HGBUR", "BILIRUBINUR", "KETONESUR", "PROTEINUR", "UROBILINOGEN", "NITRITE", "LEUKOCYTESUR" in the last 72 hours.  Invalid input(s): "APPERANCEUR"    Imaging: No results found.    Medications:      Chlorhexidine  Gluconate Cloth  6 each Topical Daily   enoxaparin  (LOVENOX ) injection  40 mg Subcutaneous Q24H   feeding supplement  237 mL Oral TID BM   folic acid   1 mg Oral Daily   furosemide   40 mg Oral BID   guaiFENesin   600 mg Oral BID   ipratropium-albuterol   3 mL Nebulization BID   losartan  25 mg Oral Daily   metoprolol  succinate  50 mg Oral Daily   multivitamin with minerals  1 tablet Oral Daily   nicotine   21 mg Transdermal Daily   predniSONE   40 mg Oral Q breakfast   sodium chloride  flush  3 mL Intravenous Q12H   spironolactone  25 mg Oral Daily   [START ON 03/08/2024] thiamine   100 mg Oral Daily   thiamine   250 mg Oral TID   acetaminophen , albuterol , metoprolol  tartrate, ondansetron  **OR** ondansetron   (ZOFRAN ) IV, mouth rinse, sodium chloride  flush  Assessment/ Plan:  67 y.o. male with  medical problems of  alcohol abuse, COPD, arthritis, history of stroke, hypertension, nutritional deficiencies including vitamin D , B12.  admitted on 03/03/2024 for Hyponatremia [E87.1] Dyspnea, unspecified type [R06.00]  #Hyponatremia Likely multifactorial.  Patient has underlying COPD and Hx of etoh abuse which may be the cause of chronic hyponatremia. Acute worsening could be secondary to alcohol abuse and volume overload.  Plan: - Serum sodium 135 - Continue high-protein drinks such as Ensure twice a day. - Transition to oral furosemide  40 mg twice a day  - Agree with cardiology team regarding starting Farxiga    # Hypokalemia - replacement as per primary team  # LE Edema - Edema has improved.  Patient transition to oral furosemide .  Due to sodium correction, we will sign off at this time.  Feel free to reconsult if needed.   LOS: 3 Anola King 5/15/202512:26 PM  [address] [place], [place] [phone]

## 2024-03-07 NOTE — Progress Notes (Addendum)
 Heart Failure Stewardship Pharmacy Note  PCP: Pcp, No PCP-Cardiologist: None  HPI: Michael Padilla is a 68 y.o. male with alcohol abuse , COPD , arthritis, hx of CVA, essential hypertension, vitamin D  and B12 deficiencies who presented with chronic respiratory failure with hypoxia, volume overload, acute on chronic hyponatremia. On admission, BNP was 1147.7, HS-troponin was 66 > 62, and sodium was 116. Chest x-ray noted mild interstitial edema and small pleural effusions.    Pertinent cardiac history: Echo in 12/2021 showed LVEF of 55-60%. Echo this admission showed LVEF of 45-50%.  Pertinent Lab Values: Creatinine, Ser  Date Value Ref Range Status  03/07/2024 0.68 0.61 - 1.24 mg/dL Final   BUN  Date Value Ref Range Status  03/07/2024 28 (H) 8 - 23 mg/dL Final   Potassium  Date Value Ref Range Status  03/07/2024 4.1 3.5 - 5.1 mmol/L Final   Sodium  Date Value Ref Range Status  03/07/2024 135 135 - 145 mmol/L Final   B Natriuretic Peptide  Date Value Ref Range Status  03/03/2024 1,147.7 (H) 0.0 - 100.0 pg/mL Final    Comment:    Performed at [facility], [address] [place], [place] [address]   Magnesium  Date Value Ref Range Status  03/07/2024 2.2 1.7 - 2.4 mg/dL Final    Comment:    Performed at [facility], [address] [place], [place] [address]   Hgb A1c MFr Bld  Date Value Ref Range Status  02/15/2023 5.5 4.8 - 5.6 % Final    Comment:    (NOTE)         Prediabetes: 5.7 - 6.4         Diabetes: >6.4         Glycemic control for adults with diabetes: <7.0    TSH  Date Value Ref Range Status  03/03/2024 1.065 0.350 - 4.500 uIU/mL Final    Comment:    Performed by a 3rd Generation assay with a functional sensitivity of <=0.01 uIU/mL. Performed at [facility], [address] [place], [place] [address]     Vital Signs:  Temp:  [98 F (36.7 C)-99 F (37.2 C)] 98.6 F (37 C) (05/15 1200) Pulse Rate:  [31-122] 109  (05/15 1200) Cardiac Rhythm: Atrial fibrillation (05/15 0800) Resp:  [16-35] 22 (05/15 1200) BP: (115-156)/(86-119) 143/102 (05/15 1200) SpO2:  [89 %-95 %] 91 % (05/15 1200) Weight:  [61.8 kg (136 lb 3.9 oz)] 61.8 kg (136 lb 3.9 oz) (05/15 0500)  Intake/Output Summary (Last 24 hours) at 03/07/2024 1352 Last data filed at 03/07/2024 0900 Gross per 24 hour  Intake 1642.62 ml  Output 1900 ml  Net -257.38 ml    Current Heart Failure Medications:  Loop diuretic: furosemide  40 mg PO BID Beta-Blocker: metoprolol  succinate 50 mg daily ACEI/ARB/ARNI: losartan 25 mg daily MRA: spironolactone 25 mg daily SGLT2i: Other:  Prior to admission Heart Failure Medications:  None   Assessment: 1. Acute on chronic systolic heart failure (LVEF 45-50%), due to presumed NICM. NYHA class II symptoms.  -Symptoms: Patient reports shortness of breath has improved since admission. No LEE noted.  -Volume: Creatinine and BUN stable from yesterday. Weight down ~ 8 pounds from admission. Continue oral furosemide  40 mg BID.  -Hemodynamics: BP normal to high. HR 90-100s.  -BB: Continue metoprolol  succinate 50 mg daily.  -ACEI/ARB/ARNI: Recently increased losartan 25 mg daily, can consider increasing tomorrow if BP remains elevated.  -MRA: Continue spironolactone 25 mg daily.  -SGLT2i: Can  consider adding Farxiga 10 mg if patient is able to afford.    Plan: 1) Medication changes recommended at this time: - None   2) Patient assistance: - Entresto copay is $177.64, Elvina Hammers copay is $151.05, Jardiance copay is $158.54    3) Education: -To be completed prior to discharge. - Patient has been educated on current HF medications and potential additions to HF medication regimen - Patient verbalizes understanding that over the next few months, these medication doses may change and more medications may be added to optimize HF regimen - Patient has been educated on basic disease state pathophysiology and goals of  therapy    Medication Assistance / Insurance Benefits Check: Does the patient have prescription insurance? Aetna, Medicare   Type of insurance plan:  Does the patient qualify for medication assistance through manufacturers or grants? Pending   Outpatient Pharmacy: Prior to admission [facility]  Please do not hesitate to reach out with questions or concerns,  Bevely Brush, PharmD, CPP, BCPS Heart Failure Pharmacist  Phone - [phone] 03/07/2024 1:52 PM

## 2024-03-07 NOTE — Progress Notes (Signed)
 Physical Therapy Treatment Patient Details Name: Michael Padilla MRN: [id number] DOB: [birth year] Today's Date: 03/07/2024   History of Present Illness Pt is a 68 y/o M admitted on 03/03/24 after presenting with c/o increased work of breathing & SOB. Pt reports drinking 4-5 40oz beers/day & smoking 1-2 packs/day. Pt supposed to use 2L O2 chronically but has been noncompliant for several months. Pt is being treated for hyponatremia, acute on chronic respiratory failure with hypoxia, acute on chronic HFmrEF, & acute nonischemic MI.  PMH: EtOH abuse, COPD, arthritis, CVA, HTN, vitamin D  & B12 deficiencies    PT Comments  Pt seen for PT tx with pt agreeable. Pt is progressing well with mobility, transferring sit<>stand with supervision, ambulating with RW & CGA. PT educates pt on pursed lip breathing (pt continues to breathe primarily through mouth) & reviews use of acapella flutter valve. Pt on 4L/min with SpO2 dropping as low as 69% after 2nd gait trial (question accuracy 2/2 poor pleth waveform) but does increase to 90% with seated rest; max HR 141 bpm.  Will continue to follow pt acutely to address balance, endurance, & gait & stairs with LRAD.   If plan is discharge home, recommend the following: A little help with bathing/dressing/bathroom;A little help with walking and/or transfers;Assistance with cooking/housework;Assist for transportation;Help with stairs or ramp for entrance   Can travel by private Theme park manager cushion (measurements PT)    Recommendations for Other Services       Precautions / Restrictions Precautions Precautions: Fall Precaution/Restrictions Comments: watch HR, O2 Restrictions Weight Bearing Restrictions Per Provider Order: No     Mobility  Bed Mobility               General bed mobility comments: not tested, pt received & left sitting in recliner    Transfers   Equipment used: Rolling walker (2  wheels) Transfers: Sit to/from Stand Sit to Stand: Supervision           General transfer comment: sit<>stand from recliner    Ambulation/Gait Ambulation/Gait assistance: Contact guard assist Gait Distance (Feet): 45 Feet (+ 65 ft) Assistive device: Rolling walker (2 wheels) Gait Pattern/deviations: Decreased step length - left, Decreased step length - right, Decreased stride length Gait velocity: decreased     General Gait Details: good awareness to walk around water spilled on floor, no overt LOB   Stairs             Wheelchair Mobility     Tilt Bed    Modified Rankin (Stroke Patients Only)       Balance Overall balance assessment: Needs assistance Sitting-balance support: Feet supported, Bilateral upper extremity supported Sitting balance-Leahy Scale: Good     Standing balance support: Bilateral upper extremity supported, Reliant on assistive device for balance, No upper extremity supported Standing balance-Leahy Scale: Good                              Communication Communication Communication: No apparent difficulties  Cognition Arousal: Alert Behavior During Therapy: Anxious                             Following commands: Intact      Cueing Cueing Techniques: Verbal cues  Exercises      General Comments General comments (skin integrity, edema, etc.): Max HR 150 bpm, spo2 82% on  4 L via [place] while amb, above >88% on 4 L after approx 1 minute and PLB; does not report SOB      Pertinent Vitals/Pain Pain Assessment Pain Assessment: No/denies pain    Home Living                          Prior Function            PT Goals (current goals can now be found in the care plan section) Acute Rehab PT Goals Patient Stated Goal: none stated PT Goal Formulation: With patient Time For Goal Achievement: 03/20/24 Potential to Achieve Goals: Good Progress towards PT goals: Progressing toward goals     Frequency    Min 2X/week      PT Plan      Co-evaluation              AM-PAC PT "6 Clicks" Mobility   Outcome Measure  Help needed turning from your back to your side while in a flat bed without using bedrails?: None Help needed moving from lying on your back to sitting on the side of a flat bed without using bedrails?: A Little Help needed moving to and from a bed to a chair (including a wheelchair)?: A Little Help needed standing up from a chair using your arms (e.g., wheelchair or bedside chair)?: A Little Help needed to walk in hospital room?: A Little Help needed climbing 3-5 steps with a railing? : A Little 6 Click Score: 19    End of Session Equipment Utilized During Treatment: Oxygen  Activity Tolerance: Patient tolerated treatment well Patient left: in chair;with call bell/phone within reach Nurse Communication: Mobility status (O2 during session) PT Visit Diagnosis: Unsteadiness on feet (R26.81);Muscle weakness (generalized) (M62.81);Other abnormalities of gait and mobility (R26.89);Difficulty in walking, not elsewhere classified (R26.2)     Time: 0960-4540 PT Time Calculation (min) (ACUTE ONLY): 19 min  Charges:    $Therapeutic Activity: 8-22 mins PT General Charges $$ ACUTE PT VISIT: 1 Visit                     Michael Padilla, PT, DPT 03/07/24, 11:00 AM    Michael Padilla 03/07/2024, 10:58 AM

## 2024-03-07 NOTE — Plan of Care (Signed)
  Problem: Clinical Measurements: Goal: Ability to maintain clinical measurements within normal limits will improve Outcome: Progressing Goal: Will remain free from infection Outcome: Progressing Goal: Diagnostic test results will improve Outcome: Progressing   Problem: Activity: Goal: Risk for activity intolerance will decrease Outcome: Progressing   Problem: Nutrition: Goal: Adequate nutrition will be maintained Outcome: Progressing   Problem: Elimination: Goal: Will not experience complications related to bowel motility Outcome: Progressing Goal: Will not experience complications related to urinary retention Outcome: Progressing   Problem: Pain Managment: Goal: General experience of comfort will improve and/or be controlled Outcome: Progressing   Problem: Safety: Goal: Ability to remain free from injury will improve Outcome: Progressing   Problem: Skin Integrity: Goal: Risk for impaired skin integrity will decrease Outcome: Progressing   Problem: Respiratory: Goal: Ability to maintain a clear airway will improve Outcome: Progressing   Problem: Education: Goal: Knowledge of General Education information will improve Description: Including pain rating scale, medication(s)/side effects and non-pharmacologic comfort measures Outcome: Not Progressing   Problem: Clinical Measurements: Goal: Respiratory complications will improve Outcome: Not Progressing   Problem: Education: Goal: Knowledge of disease or condition will improve Outcome: Not Progressing Goal: Knowledge of the prescribed therapeutic regimen will improve Outcome: Not Progressing   Problem: Respiratory: Goal: Levels of oxygenation will improve Outcome: Not Progressing

## 2024-03-07 NOTE — Consult Note (Signed)
 WOC Nurse Consult Note:  Reason for Consult: wound present on clavicle for months  Wound type: full thickness lesion vs wound vs dermatologic process  of unknown etiology  Pressure Injury POA: NA  Measurement: per nursing flowsheet  Wound bed: pink surface covered in brown patchy tissue  Drainage (amount, consistency, odor) appears dry  Periwound: intact  Dressing procedure/placement/frequency: Cleanse clavicle abrasion with Vashe wound cleanser [name] [phone]) do not rinse and allow to air dry. Apply Vaseline gauze to area daily and secure with silicone foam or gauze and tape whichever is preferred.   Patient should be referred to dermatology regarding this area as it appears atypical and has been present for months with no identifying cause (such as trauma).   POC discussed with bedside nurse. WOC team will not follow. RE-consult if further needs arise.   Thank you,    Ronni Colace MSN, RN-BC, [employer] [phone]

## 2024-03-07 NOTE — TOC Initial Note (Signed)
 Transition of Care [facility]) - Initial/Assessment Note    Patient Details  Name: Michael Padilla MRN: [id number] Date of Birth: 02/01/1956  Transition of [facility]) CM/SW Contact:    Gila Lauf A Nakeysha Pasqual, RN Phone Number: 03/07/2024, 10:34 AM  Clinical Narrative:                 Chart reviewed.  Meet with patient at bedside today.  He reports that prior to admission he lived at home with his son.  He reports that prior to admission he was able to walk.  He reports that he has a rolling walker, and BSC, and shower chair, and cane at home.    Mr. Flemming reports that he does drink 4 beers daily and smoke.  He advises me that providers have informed him that he would need to stop drinking and smoking due to his health issues.  He was agreeable to substance abuse resources.  I have added to discharge instructions.  Patient reports that he usually does not have a lot of medical appointments.  He reports that his brother will transport him to medical appointments on discharge.  He currently does not have a PCP.  I have left PCP resources on discharge instructions.    I have informed him that PT has recommended home health PT/OT on discharge.  Patient was agreeable.  He had no home health preference.  I have asked Bartholomew Light with [facility] to accept home health referral.   Noted Hyponatremia improving.     TOC will continue to follow for discharge planning.      Expected Discharge Plan: Home w Home Health Services Barriers to Discharge: Continued Medical Work up   Patient Goals and CMS Choice   CMS Medicare.gov Compare Post Acute Care list provided to:: Patient        Expected Discharge Plan and Services   Discharge Planning Services: CM Consult Post Acute Care Choice: Home Health Living arrangements for the past 2 months: Single Family Home                   DME Agency:  (Patient reports that he has home 02, not sure of provider, BSC, and rolling walker, and cane at home.)       HH  Arranged: PT, OT   Date HH Agency Contacted: 02/29/24   Representative spoke with at [facility] Agency: Bartholomew Light  Prior Living Arrangements/Services Living arrangements for the past 2 months: Single Family Home Lives with:: Adult Children Patient language and need for interpreter reviewed:: Yes Do you feel safe going back to the place where you live?: Yes        Care giver support system in place?: Yes (comment) (Patient reports that he has a supportive son.)      Activities of Daily Living   ADL Screening (condition at time of admission) Independently performs ADLs?: Yes (appropriate for developmental age) Is the patient deaf or have difficulty hearing?: No Does the patient have difficulty seeing, even when wearing glasses/contacts?: No Does the patient have difficulty concentrating, remembering, or making decisions?: No  Permission Sought/Granted                  Emotional Assessment Appearance:: Appears stated age Attitude/Demeanor/Rapport: Engaged Affect (typically observed): Appropriate Orientation: : Oriented to Self, Oriented to Place, Oriented to  Time, Oriented to Situation Alcohol / Substance Use: Tobacco Use, Alcohol Use (Substance abuse resources added to discharge instructions.)    Admission diagnosis:  Hyponatremia [E87.1] Dyspnea, unspecified  type [R06.00] Patient Active Problem List   Diagnosis Date Noted   Elevated troponin 03/03/2024   History of CVA in adulthood 02/16/2023   Thrombocytosis 02/16/2023   COPD (chronic obstructive pulmonary disease) (HCC) 02/15/2023   Hypokalemia 01/08/2022   Vitamin B12 deficiency 01/07/2022   Vitamin D  deficiency 01/07/2022   Essential hypertension 01/07/2022   Protein-calorie malnutrition, severe 01/05/2022   Acute on chronic respiratory failure with hypoxia (HCC) 01/05/2022   Acute metabolic encephalopathy 01/03/2022   Acute CVA (cerebrovascular accident) (HCC) 01/03/2022   Urinary tract infection 01/03/2022    Alcohol abuse 01/03/2022   Unresponsive episode 01/02/2022   Acute encephalopathy 01/02/2022   Acute hypoxemic respiratory failure due to COVID-19 (HCC) 11/19/2020   Gastroenteritis due to COVID-19 virus 11/17/2020   Pneumonia due to COVID-19 virus 11/17/2020   Hyponatremia 11/17/2020   Generalized weakness 11/17/2020   COPD with acute exacerbation (HCC) 11/17/2020   SOB (shortness of breath) 01/16/2016   PCP:  Pcp, No Pharmacy:   [id number] [place], [place] - [address] [place] [address] Phone: [phone] Fax: [phone]     Social Drivers of Health (SDOH) Social History: SDOH Screenings   Food Insecurity: No Food Insecurity (03/03/2024)  Housing: Low Risk  (03/03/2024)  Transportation Needs: No Transportation Needs (03/03/2024)  Utilities: Not At Risk (03/03/2024)  Social Connections: Socially Isolated (03/03/2024)  Tobacco Use: High Risk (03/03/2024)   SDOH Interventions:     Readmission Risk Interventions     No data to display

## 2024-03-07 NOTE — Progress Notes (Signed)
 PHARMACY CONSULT NOTE - FOLLOW UP  Pharmacy Consult for Electrolyte Monitoring and Replacement   Recent Labs: Potassium (mmol/L)  Date Value  03/07/2024 4.1   Magnesium (mg/dL)  Date Value  04/54/0981 2.2   Calcium (mg/dL)  Date Value  19/14/7829 9.1   Albumin (g/dL)  Date Value  56/21/3086 3.8   Phosphorus (mg/dL)  Date Value  57/84/6962 2.5   Sodium (mmol/L)  Date Value  03/07/2024 135     Assessment: 68 y/o male with h/o chronic hyponatremia, etoh abuse, HTN, COPD and CVA who is admitted with volume overload and suspected heart failure. Pharmacy is asked to follow and replace electrolytes  Diuretics: furosemide  40 mg po BID, spironolactone 25 mg po daily  Goal of Therapy:  Electrolytes WNL  Plan:  ---no electrolyte replacement warranted for today ---recheck electrolytes in am  Adalberto Acton ,PharmD Clinical Pharmacist 03/07/2024 7:37 AM

## 2024-03-08 DIAGNOSIS — I5023 Acute on chronic systolic (congestive) heart failure: Secondary | ICD-10-CM | POA: Diagnosis not present

## 2024-03-08 LAB — BASIC METABOLIC PANEL WITH GFR
Anion gap: 9 (ref 5–15)
BUN: 28 mg/dL — ABNORMAL HIGH (ref 8–23)
CO2: 35 mmol/L — ABNORMAL HIGH (ref 22–32)
Calcium: 8.9 mg/dL (ref 8.9–10.3)
Chloride: 92 mmol/L — ABNORMAL LOW (ref 98–111)
Creatinine, Ser: 0.57 mg/dL — ABNORMAL LOW (ref 0.61–1.24)
GFR, Estimated: >60 mL/min (ref >60)
Glucose, Bld: 150 mg/dL — ABNORMAL HIGH (ref 70–99)
Potassium: 3.4 mmol/L — ABNORMAL LOW (ref 3.5–5.1)
Sodium: 136 mmol/L (ref 135–145)

## 2024-03-08 LAB — CBC
HCT: 42.5 % (ref 39.0–52.0)
Hemoglobin: 14.6 g/dL (ref 13.0–17.0)
MCH: 31.1 pg (ref 26.0–34.0)
MCHC: 34.4 g/dL (ref 30.0–36.0)
MCV: 90.4 fL (ref 80.0–100.0)
Platelets: 565 10*3/uL — ABNORMAL HIGH (ref 150–400)
RBC: 4.7 MIL/uL (ref 4.22–5.81)
RDW: 13.8 % (ref 11.5–15.5)
WBC: 10 10*3/uL (ref 4.0–10.5)
nRBC: 0 % (ref 0.0–0.2)

## 2024-03-08 LAB — MAGNESIUM: Magnesium: 2.4 mg/dL (ref 1.7–2.4)

## 2024-03-08 MED ORDER — POLYETHYLENE GLYCOL 3350 17 G PO PACK
17.0000 g | PACK | Freq: Every day | ORAL | Status: DC
  Administered 2024-03-08 – 2024-03-10 (×3): 17 g via ORAL
  Filled 2024-03-08 (×4): qty 1

## 2024-03-08 MED ORDER — DOCUSATE SODIUM 100 MG PO CAPS
200.0000 mg | ORAL_CAPSULE | Freq: Two times a day (BID) | ORAL | Status: DC
  Administered 2024-03-08 – 2024-03-11 (×5): 200 mg via ORAL
  Filled 2024-03-08 (×5): qty 2

## 2024-03-08 MED ORDER — LOSARTAN POTASSIUM 50 MG PO TABS
50.0000 mg | ORAL_TABLET | Freq: Every day | ORAL | Status: DC
  Administered 2024-03-08 – 2024-03-11 (×4): 50 mg via ORAL
  Filled 2024-03-08 (×4): qty 1

## 2024-03-08 MED ORDER — ALPRAZOLAM 0.25 MG PO TABS
0.2500 mg | ORAL_TABLET | Freq: Once | ORAL | Status: AC
  Administered 2024-03-08: 0.25 mg via ORAL
  Filled 2024-03-08: qty 1

## 2024-03-08 MED ORDER — METOPROLOL SUCCINATE ER 50 MG PO TB24
75.0000 mg | ORAL_TABLET | Freq: Every day | ORAL | Status: DC
  Administered 2024-03-08 – 2024-03-11 (×4): 75 mg via ORAL
  Filled 2024-03-08 (×2): qty 1
  Filled 2024-03-08: qty 2
  Filled 2024-03-08: qty 1

## 2024-03-08 MED ORDER — POTASSIUM CHLORIDE CRYS ER 20 MEQ PO TBCR
40.0000 meq | EXTENDED_RELEASE_TABLET | Freq: Once | ORAL | Status: AC
  Administered 2024-03-08: 40 meq via ORAL
  Filled 2024-03-08: qty 2

## 2024-03-08 NOTE — Progress Notes (Addendum)
 Heart Failure Stewardship Pharmacy Note  PCP: Pcp, No PCP-Cardiologist: None  HPI: Michael Padilla is a 68 y.o. male with alcohol abuse , COPD , arthritis, hx of CVA, essential hypertension, vitamin D  and B12 deficiencies who presented with chronic respiratory failure with hypoxia, volume overload, acute on chronic hyponatremia. On admission, BNP was 1147.7, HS-troponin was 66 > 62, and sodium was 116. Chest x-ray noted mild interstitial edema and small pleural effusions.    Pertinent cardiac history: Echo in 12/2021 showed LVEF of 55-60%. Echo this admission showed LVEF of 45-50%.  Pertinent Lab Values: Creatinine, Ser  Date Value Ref Range Status  03/08/2024 0.57 (L) 0.61 - 1.24 mg/dL Final   BUN  Date Value Ref Range Status  03/08/2024 28 (H) 8 - 23 mg/dL Final   Potassium  Date Value Ref Range Status  03/08/2024 3.4 (L) 3.5 - 5.1 mmol/L Final   Sodium  Date Value Ref Range Status  03/08/2024 136 135 - 145 mmol/L Final   B Natriuretic Peptide  Date Value Ref Range Status  03/03/2024 1,147.7 (H) 0.0 - 100.0 pg/mL Final    Comment:    Performed at [facility], [address] [place], [place] [address]   Magnesium  Date Value Ref Range Status  03/08/2024 2.4 1.7 - 2.4 mg/dL Final    Comment:    Performed at [facility], [address] [place], [place] [address]   Hgb A1c MFr Bld  Date Value Ref Range Status  02/15/2023 5.5 4.8 - 5.6 % Final    Comment:    (NOTE)         Prediabetes: 5.7 - 6.4         Diabetes: >6.4         Glycemic control for adults with diabetes: <7.0    TSH  Date Value Ref Range Status  03/03/2024 1.065 0.350 - 4.500 uIU/mL Final    Comment:    Performed by a 3rd Generation assay with a functional sensitivity of <=0.01 uIU/mL. Performed at [facility], [address] [place], [place] [address]     Vital Signs:  Temp:  [97.8 F (36.6 C)-98.6 F (37 C)] 98.3 F (36.8 C) (05/16 0800) Pulse Rate:   [96-109] 103 (05/16 0924) Cardiac Rhythm: Atrial fibrillation (05/16 0800) Resp:  [20-29] 21 (05/16 0800) BP: (123-145)/(73-107) 128/90 (05/16 0924) SpO2:  [90 %-95 %] 91 % (05/16 0800) Weight:  [60.3 kg (132 lb 15 oz)] 60.3 kg (132 lb 15 oz) (05/16 0500)  Intake/Output Summary (Last 24 hours) at 03/08/2024 1039 Last data filed at 03/08/2024 9147 Gross per 24 hour  Intake 546 ml  Output 2300 ml  Net -1754 ml    Current Heart Failure Medications:  Loop diuretic: furosemide  40 mg PO BID Beta-Blocker: metoprolol  succinate 75 mg daily ACEI/ARB/ARNI: losartan 50 mg daily MRA: spironolactone 25 mg daily SGLT2i: none Other: none  Prior to admission Heart Failure Medications:  None   Assessment: 1. Acute on chronic systolic heart failure (LVEF 45-50%), due to presumed NICM. NYHA class II symptoms.  -Symptoms: Patient reports shortness of breath has improved since admission. No LEE noted.  -Volume: Creatinine decreased from yesterday, BUN stable. Weight down ~ 12 pounds from admission. Continue oral furosemide  40 mg BID.  -Hemodynamics: BP normal to high. HR 100s.  -BB: Metoprolol  succinate dose increased to 75 mg daily today.  -ACEI/ARB/ARNI: Losartan dose increased to 50 mg daily today. Can continue titration to target as BP allows  -  MRA: Continue spironolactone 25 mg daily.  -SGLT2i: Can consider adding Farxiga 10 mg if patient is able to afford.    Plan: 1) Medication changes recommended at this time: - None   2) Patient assistance: - Entresto copay is $177.64, Elvina Hammers copay is $151.05, Jardiance copay is $158.54    3) Education: -To be completed prior to discharge. - Patient has been educated on current HF medications and potential additions to HF medication regimen - Patient verbalizes understanding that over the next few months, these medication doses may change and more medications may be added to optimize HF regimen - Patient has been educated on basic disease state  pathophysiology and goals of therapy    Medication Assistance / Insurance Benefits Check: Does the patient have prescription insurance? A[employer]na, Medicare   Type of insurance plan:  Does the patient qualify for medication assistance through manufacturers or grants? Pending   Outpatient Pharmacy[facility]  Please do not hesitate to reach out with questions or concerns,  Bevely Brush, PharmD, CPP, BCPS Heart Failure Pharmacist  Phone - [phone] 03/08/2024 10:39 AM

## 2024-03-08 NOTE — Progress Notes (Signed)
 PHARMACY CONSULT NOTE - FOLLOW UP  Pharmacy Consult for Electrolyte Monitoring and Replacement   Recent Labs: Potassium (mmol/L)  Date Value  03/08/2024 3.4 (L)   Magnesium (mg/dL)  Date Value  91/47/8295 2.4   Calcium (mg/dL)  Date Value  62/13/0865 8.9   Albumin (g/dL)  Date Value  78/46/9629 3.8   Phosphorus (mg/dL)  Date Value  52/84/1324 2.5   Sodium (mmol/L)  Date Value  03/08/2024 136     Assessment: 68 y/o male with h/o chronic hyponatremia, etoh abuse, HTN, COPD and CVA who is admitted with volume overload and suspected heart failure. Pharmacy is asked to follow and replace electrolytes  Diuretics: furosemide  40 mg po BID, spironolactone 25 mg po daily  Goal of Therapy:  Electrolytes WNL  Plan:  ---40 mEq po KCl x 1 ---recheck electrolytes in am  Adalberto Acton ,PharmD Clinical Pharmacist 03/08/2024 7:08 AM

## 2024-03-08 NOTE — Care Management Important Message (Signed)
 Important Message  Patient Details  Name: Michael Padilla MRN: [id number] Date of Birth: 19-Nov-1955   Important Message Given:  Yes - Medicare IM     Anise Kerns 03/08/2024, 3:37 PM

## 2024-03-08 NOTE — Progress Notes (Signed)
 PROGRESS NOTE    Michael Padilla  ZOX:[id number] DOB: 01-16-1956 DOA: 03/03/2024 PCP: Pcp, No    Assessment & Plan:   Principal Problem:   Hyponatremia Active Problems:   Acute on chronic respiratory failure with hypoxia (HCC)   Alcohol abuse   Generalized weakness   Essential hypertension   COPD (chronic obstructive pulmonary disease) (HCC)   Elevated troponin  Assessment and Plan: Hyponatremia: likely secondary to volume/fluid overload from CHF & beer potomania. Na level is trending up. S/p 3% saline. Back WNL. Nephro signed off 03/07/24   Acute on chronic respiratory hypoxic failure: likely secondary to CHF exacerbation. Continue on supplemental oxygen  and wean to baseline as tolerated. Desaturates easily w/ movement    Acute on chronic systolic CHF: continue on lasix . Monitor I/Os. Continue on metoprolol , losartan, & aldactone. Cardio signed off 03/08/24  Alcohol abuse: received alcohol cessation counseling already. At risk for alcohol w/drawl   Acute nonischemic myocardial injury: w/ elevated troponins. Likely secondary to demand ischemia    COPD exacerbation: completed steroid course. Continue on bronchodilators. Encourage incentive spirometry    HTN: continue on metoprolol , aldactone & losartan    Generalized weakness: PT/OT recs HH   Severe malnutrition: continue on nutritional supplements      DVT prophylaxis: lovenox   Code Status: full  Family Communication:  Disposition Plan: likely d/c back home w/ HH   Level of care: Telemetry Cardiac  Status is: Inpatient Remains inpatient appropriate because: severity of illness, desaturates easily w/ movement     Consultants:  Cardio Nephro   Procedures:  Antimicrobials:   Subjective: Pt c/o malaise  Objective: Vitals:   03/08/24 0400 03/08/24 0500 03/08/24 0714 03/08/24 0800  BP: (!) 133/92   (!) 128/90  Pulse: (!) 103   98  Resp: (!) 29   (!) 21  Temp:    98.3 F (36.8 C)  TempSrc:    Oral   SpO2: 95%  94% 91%  Weight:  60.3 kg    Height:        Intake/Output Summary (Last 24 hours) at 03/08/2024 0922 Last data filed at 03/08/2024 0600 Gross per 24 hour  Intake 543 ml  Output 2300 ml  Net -1757 ml   Filed Weights   03/06/24 0500 03/07/24 0500 03/08/24 0500  Weight: 63.6 kg 61.8 kg 60.3 kg    Examination:  General exam: Appears calm & comfortable  Respiratory system: diminished breath sounds b/l   Cardiovascular system: S1 & S2+. No rubs or gallops Gastrointestinal system: Abd is soft, NT, ND & hypoactive bowel sounds Central nervous system: alert & awake., moves all extremities   Psychiatry: Judgement and insight appears at baseline.flat mood and affect    Data Reviewed: I have personally reviewed following labs and imaging studies  CBC: Recent Labs  Lab 03/04/24 0219 03/05/24 0419 03/06/24 0402 03/07/24 0329 03/08/24 0409  WBC 8.3 16.3* 15.7* 11.5* 10.0  NEUTROABS  --   --  13.1*  --   --   HGB 14.1 14.7 14.2 14.5 14.6  HCT 39.8 42.3 42.1 42.4 42.5  MCV 87.5 89.6 91.5 91.2 90.4  PLT 663* 616* 588* 597* 565*   Basic Metabolic Panel: Recent Labs  Lab 03/04/24 0837 03/04/24 1144 03/05/24 0419 03/05/24 0747 03/06/24 0402 03/06/24 0834 03/06/24 1156 03/07/24 0329 03/08/24 0409  NA 121*   < > 126*   < > 132* 133* 132* 135 136  K 4.0  --  3.5  --  3.1*  --   --  4.1 3.4*  CL 84*  --  92*  --  93*  --   --  93* 92*  CO2 27  --  30  --  32  --   --  34* 35*  GLUCOSE 141*  --  130*  --  110*  --   --  113* 150*  BUN 15  --  18  --  24*  --   --  28* 28*  CREATININE 0.75  --  0.52*  --  0.66  --   --  0.68 0.57*  CALCIUM 8.2*  --  8.5*  --  8.6*  --   --  9.1 8.9  MG  --   --  2.3  --  2.2  --   --  2.2 2.4  PHOS  --   --  3.2  --  2.6  --   --  2.5  --    < > = values in this interval not displayed.   GFR: Estimated Creatinine Clearance: 75.4 mL/min (A) (by C-G formula based on SCr of 0.57 mg/dL (L)). Liver Function Tests: Recent Labs  Lab  03/03/24 0822  AST 48*  ALT 27  ALKPHOS 88  BILITOT 1.7*  PROT 7.0  ALBUMIN 3.8   No results for input(s): "LIPASE", "AMYLASE" in the last 168 hours. No results for input(s): "AMMONIA" in the last 168 hours. Coagulation Profile: No results for input(s): "INR", "PROTIME" in the last 168 hours. Cardiac Enzymes: No results for input(s): "CKTOTAL", "CKMB", "CKMBINDEX", "TROPONINI" in the last 168 hours. BNP (last 3 results) No results for input(s): "PROBNP" in the last 8760 hours. HbA1C: No results for input(s): "HGBA1C" in the last 72 hours. CBG: Recent Labs  Lab 03/04/24 0621  GLUCAP 158*   Lipid Profile: No results for input(s): "CHOL", "HDL", "LDLCALC", "TRIG", "CHOLHDL", "LDLDIRECT" in the last 72 hours. Thyroid  Function Tests: No results for input(s): "TSH", "T4TOTAL", "FREET4", "T3FREE", "THYROIDAB" in the last 72 hours.  Anemia Panel: No results for input(s): "VITAMINB12", "FOLATE", "FERRITIN", "TIBC", "IRON", "RETICCTPCT" in the last 72 hours.  Sepsis Labs: No results for input(s): "PROCALCITON", "LATICACIDVEN" in the last 168 hours.  Recent Results (from the past 240 hours)  Resp panel by RT-PCR (RSV, Flu A&B, Covid) Anterior Nasal Swab     Status: None   Collection Time: 03/03/24  8:27 AM   Specimen: Anterior Nasal Swab  Result Value Ref Range Status   SARS Coronavirus 2 by RT PCR NEGATIVE NEGATIVE Final    Comment: (NOTE) SARS-CoV-2 target nucleic acids are NOT DETECTED.  The SARS-CoV-2 RNA is generally detectable in upper respiratory specimens during the acute phase of infection. The lowest concentration of SARS-CoV-2 viral copies this assay can detect is 138 copies/mL. A negative result does not preclude SARS-Cov-2 infection and should not be used as the sole basis for treatment or other patient management decisions. A negative result may occur with  improper specimen collection/handling, submission of specimen other than nasopharyngeal swab, presence of  viral mutation(s) within the areas targeted by this assay, and inadequate number of viral copies(<138 copies/mL). A negative result must be combined with clinical observations, patient history, and epidemiological information. The expected result is Negative.  Fact Sheet for Patients:  BloggerCourse.com  Fact Sheet for Healthcare Providers:  SeriousBroker.it  This test is no t yet approved or cleared by the [place]  FDA and  has been authorized for detection and/or diagnosis of SARS-CoV-2 by FDA under an Emergency Use Authorization (EUA).  This EUA will remain  in effect (meaning this test can be used) for the duration of the COVID-19 declaration under Section 564(b)(1) of the Act, 21 U.S.C.section 360bbb-3(b)(1), unless the authorization is terminated  or revoked sooner.       Influenza A by PCR NEGATIVE NEGATIVE Final   Influenza B by PCR NEGATIVE NEGATIVE Final    Comment: (NOTE) The Xpert Xpress SARS-CoV-2/FLU/RSV plus assay is intended as an aid in the diagnosis of influenza from Nasopharyngeal swab specimens and should not be used as a sole basis for treatment. Nasal washings and aspirates are unacceptable for Xpert Xpress SARS-CoV-2/FLU/RSV testing.  Fact Sheet for Patients: BloggerCourse.com  Fact Sheet for Healthcare Providers: SeriousBroker.it  This test is not yet approved or cleared by the [place]  FDA and has been authorized for detection and/or diagnosis of SARS-CoV-2 by FDA under an Emergency Use Authorization (EUA). This EUA will remain in effect (meaning this test can be used) for the duration of the COVID-19 declaration under Section 564(b)(1) of the Act, 21 U.S.C. section 360bbb-3(b)(1), unless the authorization is terminated or revoked.     Resp Syncytial Virus by PCR NEGATIVE NEGATIVE Final    Comment: (NOTE) Fact Sheet for  Patients: BloggerCourse.com  Fact Sheet for Healthcare Providers: SeriousBroker.it  This test is not yet approved or cleared by the [place]  FDA and has been authorized for detection and/or diagnosis of SARS-CoV-2 by FDA under an Emergency Use Authorization (EUA). This EUA will remain in effect (meaning this test can be used) for the duration of the COVID-19 declaration under Section 564(b)(1) of the Act, 21 U.S.C. section 360bbb-3(b)(1), unless the authorization is terminated or revoked.  Performed at [facility], [address] [place], [place] [address]   MRSA Next Gen by PCR, Nasal     Status: None   Collection Time: 03/04/24  6:40 AM   Specimen: Nasal Mucosa; Nasal Swab  Result Value Ref Range Status   MRSA by PCR Next Gen NOT DETECTED NOT DETECTED Final    Comment: (NOTE) The GeneXpert MRSA Assay (FDA approved for NASAL specimens only), is one component of a comprehensive MRSA colonization surveillance program. It is not intended to diagnose MRSA infection nor to guide or monitor treatment for MRSA infections. Test performance is not FDA approved in patients less than 68 years old. Performed at [facility], [address] [place], [place] [address]          Radiology Studies: No results found.      Scheduled Meds:  Chlorhexidine  Gluconate Cloth  6 each Topical Daily   enoxaparin  (LOVENOX ) injection  40 mg Subcutaneous Q24H   feeding supplement  237 mL Oral TID BM   folic acid   1 mg Oral Daily   furosemide   40 mg Oral BID   guaiFENesin   600 mg Oral BID   ipratropium-albuterol   3 mL Nebulization BID   losartan  50 mg Oral Daily   metoprolol  succinate  75 mg Oral Daily   multivitamin with minerals  1 tablet Oral Daily   nicotine   21 mg Transdermal Daily   potassium chloride   40 mEq Oral Once   predniSONE   40 mg Oral Q breakfast   sodium chloride  flush  3 mL Intravenous Q12H    spironolactone  25 mg Oral Daily   thiamine   100 mg Oral Daily   Continuous Infusions:     LOS: 4 days      Alphonsus Jeans, MD [facility] Pager [phone]  [phone]  If 7PM-7AM, please contact night-coverage [email or web address] 03/08/2024, 9:22 AM

## 2024-03-08 NOTE — Progress Notes (Signed)
 Patient transferred to floor with this RN. Pt AxOx4. VSS. 5 L . Patient's son called and updated. All patient belongings kept at bedside transferred with patient.

## 2024-03-08 NOTE — Progress Notes (Signed)
 [facility] CLINIC CARDIOLOGY PROGRESS NOTE   Patient ID: Michael Padilla MRN: [id number] DOB/AGE: 11/06/1955 68 y.o.  Admit date: 03/03/2024 Referring Physician Dr. Daisey Dryer Primary Physician Pcp, No Primary Cardiologist None Reason for Consultation Acute heart failure  HPI: Michael Padilla is a 68 y.o. male with a past medical history of hypertension, alcohol use, tobacco use, COPD, hx CVA who presented to the ED on 03/03/2024 for worsening shortness of breath for last 5 days associated with pedal edema and orthopnea. Patient denies chest pain or palpitations.  Interval History: -Patient seen and examined this AM and laying comfortably in hospital bed. Patient states he feels well without SOB, CP or palpitations.  -Patients BP HR improving. Overnight Tele showed no significant events.  -Patient diuresing great. UOP yesterday 2.4L, with stable Cr this AM.  -Patient on 4L [place] with stable SpO2. Patient reports using O2 at home PRN.  Review of systems complete and found to be negative unless listed above    Vitals:   03/08/24 0500 03/08/24 0714 03/08/24 0800 03/08/24 0924  BP:   (!) 128/90 (!) 128/90  Pulse:   98 (!) 103  Resp:   (!) 21   Temp:   98.3 F (36.8 C)   TempSrc:   Oral   SpO2:  94% 91%   Weight: 60.3 kg     Height:         Intake/Output Summary (Last 24 hours) at 03/08/2024 1205 Last data filed at 03/08/2024 9562 Gross per 24 hour  Intake 806 ml  Output 2300 ml  Net -1494 ml     PHYSICAL EXAM General: chronically ill appearing elderly male, well nourished, in no acute distress. HEENT: Normocephalic and atraumatic. Neck: No JVD.  Lungs: Normal respiratory effort on 4L . Diminished bilaterally. Heart: HRR, elevated rates. Normal S1 and S2 without gallops or murmurs. Radial & DP pulses 2+ bilaterally. Abdomen: Non-distended appearing.  Msk: Normal strength and tone for age. Extremities: No clubbing, cyanosis, edema Neuro: Alert and oriented X 3. Psych: Mood  appropriate, affect congruent.    LABS: Basic Metabolic Panel: Recent Labs    03/06/24 0402 03/06/24 0834 03/07/24 0329 03/08/24 0409  NA 132*   < > 135 136  K 3.1*  --  4.1 3.4*  CL 93*  --  93* 92*  CO2 32  --  34* 35*  GLUCOSE 110*  --  113* 150*  BUN 24*  --  28* 28*  CREATININE 0.66  --  0.68 0.57*  CALCIUM 8.6*  --  9.1 8.9  MG 2.2  --  2.2 2.4  PHOS 2.6  --  2.5  --    < > = values in this interval not displayed.   Liver Function Tests: No results for input(s): "AST", "ALT", "ALKPHOS", "BILITOT", "PROT", "ALBUMIN" in the last 72 hours.  No results for input(s): "LIPASE", "AMYLASE" in the last 72 hours. CBC: Recent Labs    03/06/24 0402 03/07/24 0329 03/08/24 0409  WBC 15.7* 11.5* 10.0  NEUTROABS 13.1*  --   --   HGB 14.2 14.5 14.6  HCT 42.1 42.4 42.5  MCV 91.5 91.2 90.4  PLT 588* 597* 565*   Cardiac Enzymes: No results for input(s): "CKTOTAL", "CKMB", "CKMBINDEX", "TROPONINIHS" in the last 72 hours.  BNP: No results for input(s): "BNP" in the last 72 hours.  D-Dimer: No results for input(s): "DDIMER" in the last 72 hours. Hemoglobin A1C: No results for input(s): "HGBA1C" in the last 72 hours. Fasting Lipid Panel:  No results for input(s): "CHOL", "HDL", "LDLCALC", "TRIG", "CHOLHDL", "LDLDIRECT" in the last 72 hours. Thyroid  Function Tests: No results for input(s): "TSH", "T4TOTAL", "T3FREE", "THYROIDAB" in the last 72 hours.  Invalid input(s): "FREET3"  Anemia Panel: No results for input(s): "VITAMINB12", "FOLATE", "FERRITIN", "TIBC", "IRON", "RETICCTPCT" in the last 72 hours.   No results found.    ECHO as above  TELEMETRY reviewed by me 03/08/24: sinus tachycardia, PACs rate 90s  EKG reviewed by me 03/08/24: sinus tachycardia, PACs, RBBB, rate 140 bpm  DATA reviewed by me 03/08/24: last 24h vitals tele labs imaging I/O hospitalist progress note.  Principal Problem:   Hyponatremia Active Problems:   Generalized weakness   Alcohol  abuse   Acute on chronic respiratory failure with hypoxia (HCC)   Essential hypertension   COPD (chronic obstructive pulmonary disease) (HCC)   Elevated troponin    ASSESSMENT AND PLAN: Michael Padilla is a 68 y.o. male with a past medical history of hypertension, alcohol use, tobacco use, COPD, hx CVA who presented to the ED on 03/03/2024 for worsening shortness of breath for last 5 days associated with pedal edema and orthopnea. Patient denies chest pain or palpitations.  # Acute Heart Failure mildly reduced EF # Hyponatremia # COPD  # Significant alcohol and tobacco use Patient presents to ED with worsening dyspnea, lower extremity edema and orthopnea. BNP elevated at 1147. EKG in ED with sinus tachycardia with PACs, RBBB rate 140 bpm. 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 Diuresed great yesterday with UOP 2.4 L, with stable Cr this AM.  BP and HR improving this AM -Continue PO lasix  40 mg twice daily. Continue to monitor UOP and renal function closley.  -Keep K > 4 and Mg >2. -Increase Losartan to 50 mg daily. -Increase metoprolol  succinate to 75 mg daily. -Continue spironolactone 25 mg daily.  -Per pharmacy, patient not eligible for copay card. Entresto, Farxiga and Jardiance are expensive. Patient can reach out to their plan, if eligible for payment plan. GDMT optimization limited due to cost of medications. -Plan for ischemic evaluation outpatient to evaluate new RWMA found on echo. -COPD management per primary.   This patient's case was discussed and created with Dr. Custovic and she is in agreement.  Cardiology will sign off. Please haiku with questions or re-engage if needed.   Signed:  Creighton Doffing, PA-C  03/08/2024, 12:05 PM [facility] Cardiology

## 2024-03-08 NOTE — TOC Progression Note (Signed)
 Transition of Care [facility]) - Progression Note    Patient Details  Name: Michael Padilla MRN: [id number] Date of Birth: [birth year]  [facility] CM/SW Contact  Adeline Petitfrere A Anaiah Mcmannis, RN Phone Number: 03/08/2024, 10:53 AM  Clinical Narrative:   Chart reviewed spoke with [facility] to arrange a PCP appointment.  Information given and they will run patient's insurance and call back with appointment time.  If they are unable to verify today, they will call patient on Monday if he has been discharged.  They did inform me that their new provider Vola Grow would be the provider seeing the patient.    I have informed Bartholomew Light with [facility] of the above information.  She will follow up with patient and [facility] office to make sure patient has an appointment scheduled.  Once patient has visit with PCP home health services can began.   I have spoken with Mitch with [employer].  He informs me that patient is active with [employer] for home 02.  Mitch reports that patient has a Product manager at home.  Mitch reports that he will provide a portable tank for discharge.    Patient with with  02 desaturating today.    TOC will continue to follow for discharge planning.        Expected Discharge Plan: Home w Home Health Services Barriers to Discharge: Continued Medical Work up  Expected Discharge Plan and Services   Discharge Planning Services: CM Consult Post Acute Care Choice: Home Health Living arrangements for the past 2 months: Single Family Home                   DME Agency:  (Patient reports that he has home 02, not sure of provider, BSC, and rolling walker, and cane at home.)       HH Arranged: PT, OT   Date HH Agency Contacted: 02/29/24   Representative spoke with at [facility] Agency: Bartholomew Light   Social Determinants of Health (SDOH) Interventions SDOH Screenings   Food Insecurity: No Food Insecurity (03/03/2024)  Housing: Low Risk  (03/03/2024)  Transportation Needs:  No Transportation Needs (03/03/2024)  Utilities: Not At Risk (03/03/2024)  Social Connections: Socially Isolated (03/03/2024)  Tobacco Use: High Risk (03/03/2024)    Readmission Risk Interventions     No data to display

## 2024-03-08 NOTE — Progress Notes (Signed)
 Physical Therapy Treatment Patient Details Name: Michael Padilla MRN: [id number] DOB: December 29, 1955 Today's Date: 03/08/2024   History of Present Illness Pt is a 68 y/o M admitted on 03/03/24 after presenting with c/o increased work of breathing & SOB. Pt reports drinking 4-5 40oz beers/day & smoking 1-2 packs/day. Pt supposed to use 2L O2 chronically but has been noncompliant for several months. Pt is being treated for hyponatremia, acute on chronic respiratory failure with hypoxia, acute on chronic HFmrEF, & acute nonischemic MI.  PMH: EtOH abuse, COPD, arthritis, CVA, HTN, vitamin D  & B12 deficiencies    PT Comments  Modified PT session due to pt just transferred from ICU to floor and lunch arrived. Pt on 5L O2 with SpO2 at 89% at rest desating to 84-86% with minimal activity in room and difficulty returning to resting level. Pt educated on PLB technique and pacing since he tends to rush through tasks. Pt set up with lunch. Will continue to progress towards acute goals. HHPT remains appropriate, pt has a Rollator at home.    If plan is discharge home, recommend the following: A little help with bathing/dressing/bathroom;A little help with walking and/or transfers;Assistance with cooking/housework;Assist for transportation;Help with stairs or ramp for entrance   Can travel by private vehicle        Equipment Recommendations  Other (comment) (Pt has a Rollator at home)    Recommendations for Other Services       Precautions / Restrictions Precautions Precautions: Fall Recall of Precautions/Restrictions: Intact Precaution/Restrictions Comments: watch HR, O2 Restrictions Weight Bearing Restrictions Per Provider Order: No     Mobility  Bed Mobility Overal bed mobility: Needs Assistance Bed Mobility: Supine to Sit     Supine to sit: Supervision          Transfers Overall transfer level: Needs assistance Equipment used: None Transfers: Sit to/from Stand, Bed to  chair/wheelchair/BSC Sit to Stand: Contact guard assist   Step pivot transfers: Contact guard assist       General transfer comment: Pt able to transfer bed>chair without AD with single UE support    Ambulation/Gait Ambulation/Gait assistance: Contact guard assist Gait Distance (Feet):  (5) Assistive device: 1 person hand held assist Gait Pattern/deviations: Decreased step length - left, Decreased step length - right, Decreased stride length Gait velocity: decreased     General Gait Details: No LOB with short distance gait bed to chair with single UE support   Stairs             Wheelchair Mobility     Tilt Bed    Modified Rankin (Stroke Patients Only)       Balance Overall balance assessment: Needs assistance Sitting-balance support: Feet supported, No upper extremity supported Sitting balance-Leahy Scale: Good     Standing balance support: Single extremity supported, During functional activity Standing balance-Leahy Scale: Fair                              Hotel manager: No apparent difficulties  Cognition Arousal: Alert Behavior During Therapy: WFL for tasks assessed/performed   PT - Cognitive impairments: No apparent impairments                       PT - Cognition Comments: Pt unaware of new need for O2 and significance of keeping O2 on Following commands: Intact      Cueing Cueing Techniques: Verbal cues  Exercises Other Exercises  Other Exercises: edu re: role of PT, role of rehab, safe mobility, pacing, energy conservation, and PLB  techniques    General Comments General comments (skin integrity, edema, etc.): Pt on 5L O2 SpO2 88% at rest, dropping with minimal activity to 84%      Pertinent Vitals/Pain Pain Assessment Pain Assessment: No/denies pain    Home Living                          Prior Function            PT Goals (current goals can now be found in the care  plan section) Acute Rehab PT Goals Patient Stated Goal: Go home Progress towards PT goals: Progressing toward goals    Frequency    Min 2X/week      PT Plan      Co-evaluation              AM-PAC PT "6 Clicks" Mobility   Outcome Measure  Help needed turning from your back to your side while in a flat bed without using bedrails?: None Help needed moving from lying on your back to sitting on the side of a flat bed without using bedrails?: A Little Help needed moving to and from a bed to a chair (including a wheelchair)?: A Little Help needed standing up from a chair using your arms (e.g., wheelchair or bedside chair)?: A Little Help needed to walk in hospital room?: A Little Help needed climbing 3-5 steps with a railing? : A Little 6 Click Score: 19    End of Session Equipment Utilized During Treatment: Oxygen  Activity Tolerance: Patient tolerated treatment well Patient left: in chair;with call bell/phone within reach;with chair alarm set;with nursing/sitter in room Nurse Communication: Mobility status PT Visit Diagnosis: Unsteadiness on feet (R26.81);Muscle weakness (generalized) (M62.81);Other abnormalities of gait and mobility (R26.89);Difficulty in walking, not elsewhere classified (R26.2)     Time: 6962-9528 PT Time Calculation (min) (ACUTE ONLY): 18 min  Charges:    $Therapeutic Activity: 8-22 mins PT General Charges $$ ACUTE PT VISIT: 1 Visit                    Melvyn Stagers, PTA  Diona Franklin 03/08/2024, 1:51 PM

## 2024-03-08 NOTE — Plan of Care (Signed)
 Patient had a decent night tonight. He was able to rest after receiving xanex to "take the edge off" and help him relax. He was expressing that he couldn't turn his mind off and had taken "nerve pills" in the past for anxiety (prescribed by a doctor). He is hopeful the xanex will continue to be available to him in the future.  Otherwise he is tolerating all treatment. All questions and concerns addressed.   Problem: Education: Goal: Knowledge of General Education information will improve Description: Including pain rating scale, medication(s)/side effects and non-pharmacologic comfort measures Outcome: Progressing   Problem: Health Behavior/Discharge Planning: Goal: Ability to manage health-related needs will improve Outcome: Progressing   Problem: Clinical Measurements: Goal: Ability to maintain clinical measurements within normal limits will improve Outcome: Progressing Goal: Will remain free from infection Outcome: Progressing Goal: Diagnostic test results will improve Outcome: Progressing Goal: Respiratory complications will improve Outcome: Progressing Goal: Cardiovascular complication will be avoided Outcome: Progressing   Problem: Activity: Goal: Risk for activity intolerance will decrease Outcome: Progressing   Problem: Nutrition: Goal: Adequate nutrition will be maintained Outcome: Progressing   Problem: Elimination: Goal: Will not experience complications related to bowel motility Outcome: Progressing Goal: Will not experience complications related to urinary retention Outcome: Progressing   Problem: Pain Managment: Goal: General experience of comfort will improve and/or be controlled Outcome: Progressing   Problem: Safety: Goal: Ability to remain free from injury will improve Outcome: Progressing   Problem: Skin Integrity: Goal: Risk for impaired skin integrity will decrease Outcome: Progressing   Problem: Education: Goal: Knowledge of disease or  condition will improve Outcome: Progressing Goal: Knowledge of the prescribed therapeutic regimen will improve Outcome: Progressing Goal: Individualized Educational Video(s) Outcome: Progressing   Problem: Activity: Goal: Ability to tolerate increased activity will improve Outcome: Progressing Goal: Will verbalize the importance of balancing activity with adequate rest periods Outcome: Progressing   Problem: Respiratory: Goal: Ability to maintain a clear airway will improve Outcome: Progressing Goal: Levels of oxygenation will improve Outcome: Progressing Goal: Ability to maintain adequate ventilation will improve Outcome: Progressing

## 2024-03-09 DIAGNOSIS — I5023 Acute on chronic systolic (congestive) heart failure: Secondary | ICD-10-CM | POA: Diagnosis not present

## 2024-03-09 LAB — BASIC METABOLIC PANEL WITH GFR
Anion gap: 8 (ref 5–15)
BUN: 31 mg/dL — ABNORMAL HIGH (ref 8–23)
CO2: 34 mmol/L — ABNORMAL HIGH (ref 22–32)
Calcium: 9 mg/dL (ref 8.9–10.3)
Chloride: 93 mmol/L — ABNORMAL LOW (ref 98–111)
Creatinine, Ser: 0.55 mg/dL — ABNORMAL LOW (ref 0.61–1.24)
GFR, Estimated: >60 mL/min (ref >60)
Glucose, Bld: 104 mg/dL — ABNORMAL HIGH (ref 70–99)
Potassium: 4.1 mmol/L (ref 3.5–5.1)
Sodium: 135 mmol/L (ref 135–145)

## 2024-03-09 LAB — CBC
HCT: 43.2 % (ref 39.0–52.0)
Hemoglobin: 14.4 g/dL (ref 13.0–17.0)
MCH: 30.4 pg (ref 26.0–34.0)
MCHC: 33.3 g/dL (ref 30.0–36.0)
MCV: 91.3 fL (ref 80.0–100.0)
Platelets: 611 10*3/uL — ABNORMAL HIGH (ref 150–400)
RBC: 4.73 MIL/uL (ref 4.22–5.81)
RDW: 13.7 % (ref 11.5–15.5)
WBC: 14.1 10*3/uL — ABNORMAL HIGH (ref 4.0–10.5)
nRBC: 0 % (ref 0.0–0.2)

## 2024-03-09 MED ORDER — ALPRAZOLAM 0.25 MG PO TABS
0.2500 mg | ORAL_TABLET | Freq: Once | ORAL | Status: AC
  Administered 2024-03-09: 0.25 mg via ORAL
  Filled 2024-03-09: qty 1

## 2024-03-09 NOTE — Progress Notes (Signed)
 PHARMACY CONSULT NOTE - FOLLOW UP  Pharmacy Consult for Electrolyte Monitoring and Replacement   Recent Labs: Potassium (mmol/L)  Date Value  03/09/2024 4.1   Magnesium (mg/dL)  Date Value  66/44/0347 2.4   Calcium (mg/dL)  Date Value  42/59/5638 9.0   Albumin (g/dL)  Date Value  75/64/3329 3.8   Phosphorus (mg/dL)  Date Value  51/88/4166 2.5   Sodium (mmol/L)  Date Value  03/09/2024 135     Assessment: 68 y/o male with h/o chronic hyponatremia, etoh abuse, HTN, COPD and CVA who is admitted with volume overload and suspected heart failure. Pharmacy is asked to follow and replace electrolytes  Diuretics: furosemide  40 mg po BID, spironolactone  25 mg po daily  On losartan  50 mg daily.   Goal of Therapy:  Electrolytes WNL  Plan:  No replacement needed F/u with AM labs.   Trinidad Funk ,PharmD Clinical Pharmacist 03/09/2024 6:48 AM

## 2024-03-09 NOTE — Plan of Care (Signed)
  Problem: Education: Goal: Knowledge of General Education information will improve Description: Including pain rating scale, medication(s)/side effects and non-pharmacologic comfort measures Outcome: Progressing   Problem: Clinical Measurements: Goal: Ability to maintain clinical measurements within normal limits will improve Outcome: Progressing Goal: Respiratory complications will improve Outcome: Not Progressing Goal: Cardiovascular complication will be avoided Outcome: Progressing

## 2024-03-09 NOTE — Progress Notes (Signed)
 PROGRESS NOTE    Michael Padilla  YQM:[id number] DOB: May 14, 1956 DOA: 03/03/2024 PCP: Pcp, No    Assessment & Plan:   Principal Problem:   Hyponatremia Active Problems:   Acute on chronic respiratory failure with hypoxia (HCC)   Alcohol abuse   Generalized weakness   Essential hypertension   COPD (chronic obstructive pulmonary disease) (HCC)   Elevated troponin  Assessment and Plan: Hyponatremia: likely secondary to volume/fluid overload from CHF & beer potomania. Na level is trending up. S/p 3% saline. Back WNL. Nephro signed off 03/07/24   Acute on chronic respiratory hypoxic failure: likely secondary to CHF exacerbation. Still desaturates easily w/ movement. Continue on supplemental oxygen  and wean as tolerated, currently on 5L [place]    Acute on chronic systolic CHF: continue on lasix , metoprolol , aldactone  & losartan . Cardio signed off on 03/08/24  Alcohol abuse: received alcohol cessation counseling already. At risk for alcohol w/drawl   Acute nonischemic myocardial injury: w/ elevated troponins. Likely secondary to demand ischemia    COPD exacerbation: completed steroid course. Continue on bronchodilators & encourage incentive spirometry    HTN: continue on losartan , aldactone  & metoprolol      Generalized weakness: PT/OT recs HH   Severe malnutrition: continue on nutritional supplements       DVT prophylaxis: lovenox   Code Status: full  Family Communication: discussed pt's care w/ pt's family at bedside and answered their questions  Disposition Plan: likely d/c back home w/ HH   Level of care: Med-Surg  Status is: Inpatient Remains inpatient appropriate because: severity of illness, still desaturates easily w/ movement     Consultants:  Cardio Nephro   Procedures:  Antimicrobials:   Subjective: Pt c/o fatigue   Objective: Vitals:   03/08/24 2052 03/09/24 0318 03/09/24 0500 03/09/24 0813  BP:  (!) 137/99  122/87  Pulse:  97  (!) 101  Resp:  16   16  Temp:  98.6 F (37 C)  (!) 97.5 F (36.4 C)  TempSrc:    Oral  SpO2: 96% 94%  93%  Weight:   60.7 kg   Height:        Intake/Output Summary (Last 24 hours) at 03/09/2024 0819 Last data filed at 03/09/2024 0815 Gross per 24 hour  Intake 383 ml  Output 1700 ml  Net -1317 ml   Filed Weights   03/07/24 0500 03/08/24 0500 03/09/24 0500  Weight: 61.8 kg 60.3 kg 60.7 kg    Examination:  General exam: Appears comfortable  Respiratory system: decreased breath sounds b/l Cardiovascular system: S1/S2+. No rubs or gallops Gastrointestinal system: abd is soft, NT, ND & hypoactive bowel sounds  Central nervous system: alert & awake. Moves all extremities Psychiatry: judgement and insight appears at baseline. Flat mood and affect   Data Reviewed: I have personally reviewed following labs and imaging studies  CBC: Recent Labs  Lab 03/05/24 0419 03/06/24 0402 03/07/24 0329 03/08/24 0409 03/09/24 0313  WBC 16.3* 15.7* 11.5* 10.0 14.1*  NEUTROABS  --  13.1*  --   --   --   HGB 14.7 14.2 14.5 14.6 14.4  HCT 42.3 42.1 42.4 42.5 43.2  MCV 89.6 91.5 91.2 90.4 91.3  PLT 616* 588* 597* 565* 611*   Basic Metabolic Panel: Recent Labs  Lab 03/05/24 0419 03/05/24 0747 03/06/24 0402 03/06/24 0834 03/06/24 1156 03/07/24 0329 03/08/24 0409 03/09/24 0313  NA 126*   < > 132* 133* 132* 135 136 135  K 3.5  --  3.1*  --   --  4.1 3.4* 4.1  CL 92*  --  93*  --   --  93* 92* 93*  CO2 30  --  32  --   --  34* 35* 34*  GLUCOSE 130*  --  110*  --   --  113* 150* 104*  BUN 18  --  24*  --   --  28* 28* 31*  CREATININE 0.52*  --  0.66  --   --  0.68 0.57* 0.55*  CALCIUM 8.5*  --  8.6*  --   --  9.1 8.9 9.0  MG 2.3  --  2.2  --   --  2.2 2.4  --   PHOS 3.2  --  2.6  --   --  2.5  --   --    < > = values in this interval not displayed.   GFR: Estimated Creatinine Clearance: 75.9 mL/min (A) (by C-G formula based on SCr of 0.55 mg/dL (L)). Liver Function Tests: Recent Labs  Lab  03/03/24 0822  AST 48*  ALT 27  ALKPHOS 88  BILITOT 1.7*  PROT 7.0  ALBUMIN 3.8   No results for input(s): "LIPASE", "AMYLASE" in the last 168 hours. No results for input(s): "AMMONIA" in the last 168 hours. Coagulation Profile: No results for input(s): "INR", "PROTIME" in the last 168 hours. Cardiac Enzymes: No results for input(s): "CKTOTAL", "CKMB", "CKMBINDEX", "TROPONINI" in the last 168 hours. BNP (last 3 results) No results for input(s): "PROBNP" in the last 8760 hours. HbA1C: No results for input(s): "HGBA1C" in the last 72 hours. CBG: Recent Labs  Lab 03/04/24 0621  GLUCAP 158*   Lipid Profile: No results for input(s): "CHOL", "HDL", "LDLCALC", "TRIG", "CHOLHDL", "LDLDIRECT" in the last 72 hours. Thyroid  Function Tests: No results for input(s): "TSH", "T4TOTAL", "FREET4", "T3FREE", "THYROIDAB" in the last 72 hours.  Anemia Panel: No results for input(s): "VITAMINB12", "FOLATE", "FERRITIN", "TIBC", "IRON", "RETICCTPCT" in the last 72 hours.  Sepsis Labs: No results for input(s): "PROCALCITON", "LATICACIDVEN" in the last 168 hours.  Recent Results (from the past 240 hours)  Resp panel by RT-PCR (RSV, Flu A&B, Covid) Anterior Nasal Swab     Status: None   Collection Time: 03/03/24  8:27 AM   Specimen: Anterior Nasal Swab  Result Value Ref Range Status   SARS Coronavirus 2 by RT PCR NEGATIVE NEGATIVE Final    Comment: (NOTE) SARS-CoV-2 target nucleic acids are NOT DETECTED.  The SARS-CoV-2 RNA is generally detectable in upper respiratory specimens during the acute phase of infection. The lowest concentration of SARS-CoV-2 viral copies this assay can detect is 138 copies/mL. A negative result does not preclude SARS-Cov-2 infection and should not be used as the sole basis for treatment or other patient management decisions. A negative result may occur with  improper specimen collection/handling, submission of specimen other than nasopharyngeal swab, presence of  viral mutation(s) within the areas targeted by this assay, and inadequate number of viral copies(<138 copies/mL). A negative result must be combined with clinical observations, patient history, and epidemiological information. The expected result is Negative.  Fact Sheet for Patients:  BloggerCourse.com  Fact Sheet for Healthcare Providers:  SeriousBroker.it  This test is no t yet approved or cleared by the [place]  FDA and  has been authorized for detection and/or diagnosis of SARS-CoV-2 by FDA under an Emergency Use Authorization (EUA). This EUA will remain  in effect (meaning this test can be used) for the duration of the COVID-19 declaration under Section 564(b)(1)  of the Act, 21 U.S.C.section 360bbb-3(b)(1), unless the authorization is terminated  or revoked sooner.       Influenza A by PCR NEGATIVE NEGATIVE Final   Influenza B by PCR NEGATIVE NEGATIVE Final    Comment: (NOTE) The Xpert Xpress SARS-CoV-2/FLU/RSV plus assay is intended as an aid in the diagnosis of influenza from Nasopharyngeal swab specimens and should not be used as a sole basis for treatment. Nasal washings and aspirates are unacceptable for Xpert Xpress SARS-CoV-2/FLU/RSV testing.  Fact Sheet for Patients: BloggerCourse.com  Fact Sheet for Healthcare Providers: SeriousBroker.it  This test is not yet approved or cleared by the [place]  FDA and has been authorized for detection and/or diagnosis of SARS-CoV-2 by FDA under an Emergency Use Authorization (EUA). This EUA will remain in effect (meaning this test can be used) for the duration of the COVID-19 declaration under Section 564(b)(1) of the Act, 21 U.S.C. section 360bbb-3(b)(1), unless the authorization is terminated or revoked.     Resp Syncytial Virus by PCR NEGATIVE NEGATIVE Final    Comment: (NOTE) Fact Sheet for  Patients: BloggerCourse.com  Fact Sheet for Healthcare Providers: SeriousBroker.it  This test is not yet approved or cleared by the [place]  FDA and has been authorized for detection and/or diagnosis of SARS-CoV-2 by FDA under an Emergency Use Authorization (EUA). This EUA will remain in effect (meaning this test can be used) for the duration of the COVID-19 declaration under Section 564(b)(1) of the Act, 21 U.S.C. section 360bbb-3(b)(1), unless the authorization is terminated or revoked.  Performed at [facility], [address] [place], [place] [address]   MRSA Next Gen by PCR, Nasal     Status: None   Collection Time: 03/04/24  6:40 AM   Specimen: Nasal Mucosa; Nasal Swab  Result Value Ref Range Status   MRSA by PCR Next Gen NOT DETECTED NOT DETECTED Final    Comment: (NOTE) The GeneXpert MRSA Assay (FDA approved for NASAL specimens only), is one component of a comprehensive MRSA colonization surveillance program. It is not intended to diagnose MRSA infection nor to guide or monitor treatment for MRSA infections. Test performance is not FDA approved in patients less than 107 years old. Performed at [facility], [address], [place], [place] [address]          Radiology Studies: No results found.      Scheduled Meds:  Chlorhexidine  Gluconate Cloth  6 each Topical Daily   docusate sodium   200 mg Oral BID   enoxaparin  (LOVENOX ) injection  40 mg Subcutaneous Q24H   feeding supplement  237 mL Oral TID BM   folic acid   1 mg Oral Daily   furosemide   40 mg Oral BID   guaiFENesin   600 mg Oral BID   ipratropium-albuterol   3 mL Nebulization BID   losartan   50 mg Oral Daily   metoprolol  succinate  75 mg Oral Daily   multivitamin with minerals  1 tablet Oral Daily   nicotine   21 mg Transdermal Daily   polyethylene glycol  17 g Oral Daily   sodium chloride  flush  3 mL Intravenous Q12H    spironolactone   25 mg Oral Daily   thiamine   100 mg Oral Daily   Continuous Infusions:     LOS: 5 days      Alphonsus Jeans, MD [facility] Pager [phone]  If 7PM-7AM, please contact night-coverage [email or web address] 03/09/2024, 8:19 AM

## 2024-03-09 NOTE — TOC Initial Note (Addendum)
 Transition of Care [facility]) - Initial/Assessment Note    Patient Details  Name: Michael Padilla MRN: [id number] Date of Birth: [birth year]  [facility] CM/SW Contact:    Crayton Docker, RN 03/09/2024, 4:42 PM  Clinical Narrative:                  CM to patient's room regarding TOC screening assessment. CM introduced case management role and discharge care planning process. Patient verbalized understanding and agreement with screening interview. Patient states no prior home health or SNF experience.   CM and patient discussed home health recommendations. Patient states no preferences.  Patient states oxygen  vendor delivered portable oxygen  unit on 03/08/2024, noted at bedside.  CM will follow up.   CM call to Naoma Bacca [facility], phone: [phone] regarding home health PT/OT referral. No answer, CM left message for return call.  Expected Discharge Plan: Home/Self Care Barriers to Discharge: Continued Medical Work up   Patient Goals and CMS Choice   CMS Medicare.gov Compare Post Acute Care list provided to:: Patient     Expected Discharge Plan and Services   Discharge Planning Services: CM Consult Post Acute Care Choice: Home Health Living arrangements for the past 2 months: Single Family Home                   DME Agency:  (Patient reports that he has home 02, not sure of provider, BSC, and rolling walker, and cane at home.)    HH Arranged: PT, OT   Date HH Agency Contacted: 02/29/24   Representative spoke with at [facility] Agency: Bartholomew Light  Prior Living Arrangements/Services Living arrangements for the past 2 months: Single Family Home Lives with:: Self, Adult Children Patient language and need for interpreter reviewed:: No Do you feel safe going back to the place where you live?: Yes      Need for Family Participation in Patient Care: Yes (Comment) Care giver support system in place?: Yes (comment) Current home services: DME Criminal Activity/Legal  Involvement Pertinent to Current Situation/Hospitalization: No - Comment as needed  Activities of Daily Living   ADL Screening (condition at time of admission) Independently performs ADLs?: Yes (appropriate for developmental age) Is the patient deaf or have difficulty hearing?: No Does the patient have difficulty seeing, even when wearing glasses/contacts?: No Does the patient have difficulty concentrating, remembering, or making decisions?: No  Permission Sought/Granted Permission sought to share information with : Case Manager, Family Supports Permission granted to share information with : Yes, Verbal Permission Granted  Share Information with NAME: Brandon/Brett     Permission granted to share info w Relationship: Son/Son  Permission granted to share info w Contact Information: Yes  Emotional Assessment Appearance:: Appears older than stated age Attitude/Demeanor/Rapport: Engaged Affect (typically observed): Calm Orientation: : Oriented to Self, Oriented to Place, Oriented to  Time, Oriented to Situation Alcohol / Substance Use: Tobacco Use, Alcohol Use (Last alcohol use and cigarette use on 03/03/2024) Psych Involvement: No (comment)  Admission diagnosis:  Hyponatremia [E87.1] Dyspnea, unspecified type [R06.00] Patient Active Problem List   Diagnosis Date Noted   Elevated troponin 03/03/2024   History of CVA in adulthood 02/16/2023   Thrombocytosis 02/16/2023   COPD (chronic obstructive pulmonary disease) (HCC) 02/15/2023   Hypokalemia 01/08/2022   Vitamin B12 deficiency 01/07/2022   Vitamin D  deficiency 01/07/2022   Essential hypertension 01/07/2022   Protein-calorie malnutrition, severe 01/05/2022   Acute on chronic respiratory failure with hypoxia (HCC) 01/05/2022   Acute metabolic  encephalopathy 01/03/2022   Acute CVA (cerebrovascular accident) (HCC) 01/03/2022   Urinary tract infection 01/03/2022   Alcohol abuse 01/03/2022   Unresponsive episode 01/02/2022    Acute encephalopathy 01/02/2022   Acute hypoxemic respiratory failure due to COVID-19 [facility]) 11/19/2020   Gastroenteritis due to COVID-19 virus 11/17/2020   Pneumonia due to COVID-19 virus 11/17/2020   Hyponatremia 11/17/2020   Generalized weakness 11/17/2020   COPD with acute exacerbation (HCC) 11/17/2020   SOB (shortness of breath) 01/16/2016   PCP:  Pcp, No Pharmacy:   [id number] [place], [place] - [address] [place] [address] Phone: [phone] Fax: [phone]     Social Drivers of Health (SDOH) Social History: SDOH Screenings   Food Insecurity: No Food Insecurity (03/03/2024)  Housing: Low Risk  (03/03/2024)  Transportation Needs: No Transportation Needs (03/03/2024)  Utilities: Not At Risk (03/03/2024)  Social Connections: Socially Isolated (03/03/2024)  Tobacco Use: High Risk (03/03/2024)   SDOH Interventions:     Readmission Risk Interventions     No data to display

## 2024-03-10 DIAGNOSIS — I5023 Acute on chronic systolic (congestive) heart failure: Secondary | ICD-10-CM | POA: Diagnosis not present

## 2024-03-10 LAB — BASIC METABOLIC PANEL WITH GFR
Anion gap: 10 (ref 5–15)
BUN: 25 mg/dL — ABNORMAL HIGH (ref 8–23)
CO2: 30 mmol/L (ref 22–32)
Calcium: 8.9 mg/dL (ref 8.9–10.3)
Chloride: 94 mmol/L — ABNORMAL LOW (ref 98–111)
Creatinine, Ser: 0.5 mg/dL — ABNORMAL LOW (ref 0.61–1.24)
GFR, Estimated: >60 mL/min (ref >60)
Glucose, Bld: 86 mg/dL (ref 70–99)
Potassium: 4.2 mmol/L (ref 3.5–5.1)
Sodium: 134 mmol/L — ABNORMAL LOW (ref 135–145)

## 2024-03-10 LAB — CBC
HCT: 43.5 % (ref 39.0–52.0)
Hemoglobin: 15 g/dL (ref 13.0–17.0)
MCH: 31.1 pg (ref 26.0–34.0)
MCHC: 34.5 g/dL (ref 30.0–36.0)
MCV: 90.1 fL (ref 80.0–100.0)
Platelets: 636 10*3/uL — ABNORMAL HIGH (ref 150–400)
RBC: 4.83 MIL/uL (ref 4.22–5.81)
RDW: 14 % (ref 11.5–15.5)
WBC: 14.4 10*3/uL — ABNORMAL HIGH (ref 4.0–10.5)
nRBC: 0 % (ref 0.0–0.2)

## 2024-03-10 MED ORDER — ALPRAZOLAM 0.25 MG PO TABS
0.2500 mg | ORAL_TABLET | Freq: Two times a day (BID) | ORAL | Status: DC | PRN
  Administered 2024-03-10: 0.25 mg via ORAL
  Filled 2024-03-10: qty 1

## 2024-03-10 NOTE — Plan of Care (Signed)
 Patient Stable. On nasal canal with 5 L of oxygen . Spo2 -89%.Vitals stable.

## 2024-03-10 NOTE — Plan of Care (Signed)
   Problem: Education: Goal: Knowledge of General Education information will improve Description Including pain rating scale, medication(s)/side effects and non-pharmacologic comfort measures Outcome: Progressing

## 2024-03-10 NOTE — Progress Notes (Signed)
 PHARMACY CONSULT NOTE - FOLLOW UP  Pharmacy Consult for Electrolyte Monitoring and Replacement   Recent Labs: Potassium (mmol/L)  Date Value  03/10/2024 4.2   Magnesium (mg/dL)  Date Value  40/98/1191 2.4   Calcium (mg/dL)  Date Value  47/82/9562 8.9   Albumin (g/dL)  Date Value  13/05/6577 3.8   Phosphorus (mg/dL)  Date Value  46/96/2952 2.5   Sodium (mmol/L)  Date Value  03/10/2024 134 (L)     Assessment: 68 y/o male with h/o chronic hyponatremia, etoh abuse, HTN, COPD and CVA who is admitted with volume overload and suspected heart failure. Pharmacy is asked to follow and replace electrolytes  Diuretics: furosemide  40 mg po BID, spironolactone  25 mg po daily  On losartan  50 mg daily.   Goal of Therapy:  Electrolytes WNL  Plan:  No replacement needed. Electrolytes stable. Will sign off. Please re-consult if needed.   Trinidad Funk ,PharmD Clinical Pharmacist 03/10/2024 7:00 AM

## 2024-03-10 NOTE — Progress Notes (Signed)
 PROGRESS NOTE    Michael Padilla  NWG:[id number] DOB: 1955/11/16 DOA: 03/03/2024 PCP: Pcp, No    Assessment & Plan:   Principal Problem:   Hyponatremia Active Problems:   Acute on chronic respiratory failure with hypoxia (HCC)   Alcohol abuse   Generalized weakness   Essential hypertension   COPD (chronic obstructive pulmonary disease) (HCC)   Elevated troponin  Assessment and Plan: Hyponatremia: likely secondary to volume/fluid overload from CHF & beer potomania. Na level is trending up. S/p 3% saline. Back WNL. Nephro signed off 03/07/24   Acute on chronic respiratory hypoxic failure: likely secondary to CHF exacerbation. Still desaturates easily w/ movement. Continue on supplemental oxygen  and have been unable to wean pt back to baseline of 2L [place], likely 5L [place] will be pt's new baseline   Acute on chronic systolic CHF: continue on losartan , metoprolol , aldactone , lasix . Cardio signed off on 03/08/24  Alcohol abuse: received alcohol cessation counseling already. At risk for alcohol w/drawl   Acute nonischemic myocardial injury: w/ elevated troponins. Likely secondary to demand ischemia    COPD exacerbation: completed steroid course. Continue on bronchodilators & wean as tolerated   HTN: continue on metoprolol , losartan , & aldactone      Generalized weakness: PT/OT recs HH   Severe malnutrition: continue on nutritional supplements   Smoker: smokes 3 ppd. Received smoking cessation counseling x 5 mins    DVT prophylaxis: lovenox   Code Status: full  Family Communication: discussed pt's care w/ pt's son, Ace Holder, and answered his questions  Disposition Plan: likely d/c back home w/ HH   Level of care: Med-Surg  Status is: Inpatient Remains inpatient appropriate because: severity of illness, still desaturates easily w/ movement    Consultants:  Cardio Nephro   Procedures:  Antimicrobials:   Subjective: Pt c/o malaise   Objective: Vitals:   03/09/24 1634  03/09/24 1836 03/09/24 2039 03/10/24 0441  BP: 113/85  (!) 125/100 117/73  Pulse: 64  67 99  Resp:   18 18  Temp:   98.1 F (36.7 C) 97.8 F (36.6 C)  TempSrc:   Oral Oral  SpO2: 95% 94% 95% 93%  Weight:      Height:        Intake/Output Summary (Last 24 hours) at 03/10/2024 0800 Last data filed at 03/10/2024 0130 Gross per 24 hour  Intake 120 ml  Output 1750 ml  Net -1630 ml   Filed Weights   03/07/24 0500 03/08/24 0500 03/09/24 0500  Weight: 61.8 kg 60.3 kg 60.7 kg    Examination:  General exam: Appears calm & comfortable Respiratory system: diminished breath sounds b/l  Cardiovascular system: S1 & S2+. No rubs or clicks  Gastrointestinal system: abd is soft, NT, ND & moves all extremities  Central nervous system: alert & awake. Moves all extremities  Psychiatry: judgement and insight appears at baseline. Flat mood and affect    Data Reviewed: I have personally reviewed following labs and imaging studies  CBC: Recent Labs  Lab 03/06/24 0402 03/07/24 0329 03/08/24 0409 03/09/24 0313 03/10/24 0436  WBC 15.7* 11.5* 10.0 14.1* 14.4*  NEUTROABS 13.1*  --   --   --   --   HGB 14.2 14.5 14.6 14.4 15.0  HCT 42.1 42.4 42.5 43.2 43.5  MCV 91.5 91.2 90.4 91.3 90.1  PLT 588* 597* 565* 611* 636*   Basic Metabolic Panel: Recent Labs  Lab 03/05/24 0419 03/05/24 0747 03/06/24 0402 03/06/24 5784 03/06/24 1156 03/07/24 0329 03/08/24 0409 03/09/24 0313 03/10/24  0436  NA 126*   < > 132*   < > 132* 135 136 135 134*  K 3.5  --  3.1*  --   --  4.1 3.4* 4.1 4.2  CL 92*  --  93*  --   --  93* 92* 93* 94*  CO2 30  --  32  --   --  34* 35* 34* 30  GLUCOSE 130*  --  110*  --   --  113* 150* 104* 86  BUN 18  --  24*  --   --  28* 28* 31* 25*  CREATININE 0.52*  --  0.66  --   --  0.68 0.57* 0.55* 0.50*  CALCIUM 8.5*  --  8.6*  --   --  9.1 8.9 9.0 8.9  MG 2.3  --  2.2  --   --  2.2 2.4  --   --   PHOS 3.2  --  2.6  --   --  2.5  --   --   --    < > = values in this  interval not displayed.   GFR: Estimated Creatinine Clearance: 75.9 mL/min (A) (by C-G formula based on SCr of 0.5 mg/dL (L)). Liver Function Tests: Recent Labs  Lab 03/03/24 0822  AST 48*  ALT 27  ALKPHOS 88  BILITOT 1.7*  PROT 7.0  ALBUMIN 3.8   No results for input(s): "LIPASE", "AMYLASE" in the last 168 hours. No results for input(s): "AMMONIA" in the last 168 hours. Coagulation Profile: No results for input(s): "INR", "PROTIME" in the last 168 hours. Cardiac Enzymes: No results for input(s): "CKTOTAL", "CKMB", "CKMBINDEX", "TROPONINI" in the last 168 hours. BNP (last 3 results) No results for input(s): "PROBNP" in the last 8760 hours. HbA1C: No results for input(s): "HGBA1C" in the last 72 hours. CBG: Recent Labs  Lab 03/04/24 0621  GLUCAP 158*   Lipid Profile: No results for input(s): "CHOL", "HDL", "LDLCALC", "TRIG", "CHOLHDL", "LDLDIRECT" in the last 72 hours. Thyroid  Function Tests: No results for input(s): "TSH", "T4TOTAL", "FREET4", "T3FREE", "THYROIDAB" in the last 72 hours.  Anemia Panel: No results for input(s): "VITAMINB12", "FOLATE", "FERRITIN", "TIBC", "IRON", "RETICCTPCT" in the last 72 hours.  Sepsis Labs: No results for input(s): "PROCALCITON", "LATICACIDVEN" in the last 168 hours.  Recent Results (from the past 240 hours)  Resp panel by RT-PCR (RSV, Flu A&B, Covid) Anterior Nasal Swab     Status: None   Collection Time: 03/03/24  8:27 AM   Specimen: Anterior Nasal Swab  Result Value Ref Range Status   SARS Coronavirus 2 by RT PCR NEGATIVE NEGATIVE Final    Comment: (NOTE) SARS-CoV-2 target nucleic acids are NOT DETECTED.  The SARS-CoV-2 RNA is generally detectable in upper respiratory specimens during the acute phase of infection. The lowest concentration of SARS-CoV-2 viral copies this assay can detect is 138 copies/mL. A negative result does not preclude SARS-Cov-2 infection and should not be used as the sole basis for treatment or other  patient management decisions. A negative result may occur with  improper specimen collection/handling, submission of specimen other than nasopharyngeal swab, presence of viral mutation(s) within the areas targeted by this assay, and inadequate number of viral copies(<138 copies/mL). A negative result must be combined with clinical observations, patient history, and epidemiological information. The expected result is Negative.  Fact Sheet for Patients:  BloggerCourse.com  Fact Sheet for Healthcare Providers:  SeriousBroker.it  This test is no t yet approved or cleared by the [place]  [place] FDA and  has been authorized for detection and/or diagnosis of SARS-CoV-2 by FDA under an Emergency Use Authorization (EUA). This EUA will remain  in effect (meaning this test can be used) for the duration of the COVID-19 declaration under Section 564(b)(1) of the Act, 21 U.S.C.section 360bbb-3(b)(1), unless the authorization is terminated  or revoked sooner.       Influenza A by PCR NEGATIVE NEGATIVE Final   Influenza B by PCR NEGATIVE NEGATIVE Final    Comment: (NOTE) The Xpert Xpress SARS-CoV-2/FLU/RSV plus assay is intended as an aid in the diagnosis of influenza from Nasopharyngeal swab specimens and should not be used as a sole basis for treatment. Nasal washings and aspirates are unacceptable for Xpert Xpress SARS-CoV-2/FLU/RSV testing.  Fact Sheet for Patients: BloggerCourse.com  Fact Sheet for Healthcare Providers: SeriousBroker.it  This test is not yet approved or cleared by the [place]  FDA and has been authorized for detection and/or diagnosis of SARS-CoV-2 by FDA under an Emergency Use Authorization (EUA). This EUA will remain in effect (meaning this test can be used) for the duration of the COVID-19 declaration under Section 564(b)(1) of the Act, 21 U.S.C. section  360bbb-3(b)(1), unless the authorization is terminated or revoked.     Resp Syncytial Virus by PCR NEGATIVE NEGATIVE Final    Comment: (NOTE) Fact Sheet for Patients: BloggerCourse.com  Fact Sheet for Healthcare Providers: SeriousBroker.it  This test is not yet approved or cleared by the [place]  FDA and has been authorized for detection and/or diagnosis of SARS-CoV-2 by FDA under an Emergency Use Authorization (EUA). This EUA will remain in effect (meaning this test can be used) for the duration of the COVID-19 declaration under Section 564(b)(1) of the Act, 21 U.S.C. section 360bbb-3(b)(1), unless the authorization is terminated or revoked.  Performed at [facility], [address] [place], [place] [address]   MRSA Next Gen by PCR, Nasal     Status: None   Collection Time: 03/04/24  6:40 AM   Specimen: Nasal Mucosa; Nasal Swab  Result Value Ref Range Status   MRSA by PCR Next Gen NOT DETECTED NOT DETECTED Final    Comment: (NOTE) The GeneXpert MRSA Assay (FDA approved for NASAL specimens only), is one component of a comprehensive MRSA colonization surveillance program. It is not intended to diagnose MRSA infection nor to guide or monitor treatment for MRSA infections. Test performance is not FDA approved in patients less than 30 years old. Performed at [facility], [address], [place], [place] [address]          Radiology Studies: No results found.      Scheduled Meds:  Chlorhexidine  Gluconate Cloth  6 each Topical Daily   docusate sodium   200 mg Oral BID   enoxaparin  (LOVENOX ) injection  40 mg Subcutaneous Q24H   feeding supplement  237 mL Oral TID BM   folic acid   1 mg Oral Daily   furosemide   40 mg Oral BID   guaiFENesin   600 mg Oral BID   ipratropium-albuterol   3 mL Nebulization BID   losartan   50 mg Oral Daily   metoprolol  succinate  75 mg Oral Daily   multivitamin with  minerals  1 tablet Oral Daily   nicotine   21 mg Transdermal Daily   polyethylene glycol  17 g Oral Daily   sodium chloride  flush  3 mL Intravenous Q12H   spironolactone   25 mg Oral Daily   thiamine   100 mg Oral Daily   Continuous  Infusions:     LOS: 6 days      Alphonsus Jeans, MD [facility] Pager [phone]  If 7PM-7AM, please contact night-coverage [email or web address] 03/10/2024, 8:00 AM

## 2024-03-10 NOTE — Progress Notes (Signed)
 SATURATION QUALIFICATIONS: (This note is used to comply with regulatory documentation for home oxygen )  Patient Saturations on Room Air at Rest = 84%  Patient Saturations on Room Air while Ambulating = 79-80%  Patient Saturations on 4L Liters of oxygen  while Ambulating = 79-81% At rest O2 sat on 4L 86-87% O2 increased to 5L [place] O2 sat at rest 88-92%

## 2024-03-11 ENCOUNTER — Other Ambulatory Visit: Payer: Self-pay

## 2024-03-11 DIAGNOSIS — E871 Hypo-osmolality and hyponatremia: Secondary | ICD-10-CM | POA: Diagnosis not present

## 2024-03-11 DIAGNOSIS — I5023 Acute on chronic systolic (congestive) heart failure: Secondary | ICD-10-CM | POA: Diagnosis not present

## 2024-03-11 LAB — CBC
HCT: 41.7 % (ref 39.0–52.0)
Hemoglobin: 13.9 g/dL (ref 13.0–17.0)
MCH: 30.3 pg (ref 26.0–34.0)
MCHC: 33.3 g/dL (ref 30.0–36.0)
MCV: 90.8 fL (ref 80.0–100.0)
Platelets: 602 10*3/uL — ABNORMAL HIGH (ref 150–400)
RBC: 4.59 MIL/uL (ref 4.22–5.81)
RDW: 13.8 % (ref 11.5–15.5)
WBC: 10.7 10*3/uL — ABNORMAL HIGH (ref 4.0–10.5)
nRBC: 0 % (ref 0.0–0.2)

## 2024-03-11 LAB — BASIC METABOLIC PANEL WITH GFR
Anion gap: 8 (ref 5–15)
BUN: 29 mg/dL — ABNORMAL HIGH (ref 8–23)
CO2: 29 mmol/L (ref 22–32)
Calcium: 8.3 mg/dL — ABNORMAL LOW (ref 8.9–10.3)
Chloride: 94 mmol/L — ABNORMAL LOW (ref 98–111)
Creatinine, Ser: 0.77 mg/dL (ref 0.61–1.24)
GFR, Estimated: >60 mL/min (ref >60)
Glucose, Bld: 87 mg/dL (ref 70–99)
Potassium: 4.5 mmol/L (ref 3.5–5.1)
Sodium: 131 mmol/L — ABNORMAL LOW (ref 135–145)

## 2024-03-11 MED ORDER — TRELEGY ELLIPTA 100-62.5-25 MCG/ACT IN AEPB
1.0000 | INHALATION_SPRAY | Freq: Every day | RESPIRATORY_TRACT | 0 refills | Status: AC
  Filled 2024-03-11: qty 60, 30d supply, fill #0

## 2024-03-11 MED ORDER — LOSARTAN POTASSIUM 50 MG PO TABS
50.0000 mg | ORAL_TABLET | Freq: Every day | ORAL | 0 refills | Status: AC
  Filled 2024-03-11: qty 30, 30d supply, fill #0

## 2024-03-11 MED ORDER — METOPROLOL SUCCINATE ER 25 MG PO TB24
75.0000 mg | ORAL_TABLET | Freq: Every day | ORAL | 0 refills | Status: AC
  Filled 2024-03-11: qty 90, 30d supply, fill #0

## 2024-03-11 MED ORDER — FUROSEMIDE 40 MG PO TABS
40.0000 mg | ORAL_TABLET | Freq: Two times a day (BID) | ORAL | 0 refills | Status: AC
  Filled 2024-03-11: qty 30, 15d supply, fill #0

## 2024-03-11 MED ORDER — SPIRONOLACTONE 25 MG PO TABS
25.0000 mg | ORAL_TABLET | Freq: Every day | ORAL | 0 refills | Status: AC
  Filled 2024-03-11: qty 30, 30d supply, fill #0

## 2024-03-11 MED ORDER — IPRATROPIUM-ALBUTEROL 20-100 MCG/ACT IN AERS
1.0000 | INHALATION_SPRAY | Freq: Four times a day (QID) | RESPIRATORY_TRACT | 0 refills | Status: AC | PRN
  Filled 2024-03-11: qty 4, 30d supply, fill #0

## 2024-03-11 NOTE — Progress Notes (Signed)
 OT Cancellation Note  Patient Details Name: Michael Padilla MRN: [id number] DOB: 1956-02-26   Cancelled Treatment:    Reason Eval/Treat Not Completed: Other (comment) OT entered room to offer OOB activities.  Pt quickly declined and reported he's hoping to go home today and he just got comfortable and doesn't get to get SOB getting up.  OT attempted to initiate educ on EC strategies with discussion of challenging ADLs at home, but pt became irritated and stated, I've been through this over and over and I'm tired of answering these questions.  OT assured pt these discussions are to improve activity tolerance at home and prevent rehospitalization, specifically noting his avoidance of OOB activity to avoid SOB, but pt continued to decline tx and showed increasing frustration with attempts at education this date.  Marcus Sewer, MS, OTR/L   Casandra Claw 03/11/2024, 11:05 AM

## 2024-03-11 NOTE — Progress Notes (Signed)
 Nutrition Follow-up  DOCUMENTATION CODES:   Severe malnutrition in context of chronic illness  INTERVENTION:   -Continue regular diet -Continue Ensure Enlive po TID, each supplement provides 350 kcal and 20 grams of protein.  -Continue MVI with minerals daily -Continue 100 mg thiamine  daily -Continue 1 mg folic acid  daily  NUTRITION DIAGNOSIS:   Severe Malnutrition related to chronic illness (COPD, etoh abuse) as evidenced by severe fat depletion, severe muscle depletion.  Ongoing  GOAL:   Patient will meet greater than or equal to 90% of their needs  Progressing   MONITOR:   PO intake, Supplement acceptance, Labs, Weight trends, I & O's, Skin  REASON FOR ASSESSMENT:   Consult Assessment of nutrition requirement/status  ASSESSMENT:   68 y/o male with h/o chronic hyponatremia, etoh abuse, HTN, COPD and CVA who is admitted with volume overload and suspected heart failure.  Reviewed I/O's: -1.1 L x 24 hours and -9.6 L since admission  UOP: 1.9 L x 24 hors  Pt sitting up in bed at time of visit. No family present. Pt is pleasant and in good spirits today, stating he's "as good as I can be". Pt reports good appetite and is consuming most of the food he is served. Noted meal completions 75-100%. Pt also drinking Ensure supplements when he receives them. Discussed importance of good meal and supplement intake to promote healing. Pt with no further questions, but expressed appreciation for visit.   Reviewed wts; pt has experienced a 7.2% wt loss over the past week, which is significant for time frame, however, likely related to diuresis (-9.6 L since admission).   Per TOC notes, plan to discharge home with home health services once medically stable.   Medications reviewed and include colace, lovenox , folic acid , lasix , aldactone , miralax , and thiamine .   Labs reviewed: Na: 131, CBGS: 158 (inpatient orders for glycemic control are none). Vitamin B-12 WDL.   Diet Order:    Diet Order             Diet 2 gram sodium Room service appropriate? Yes; Fluid consistency: Thin; Fluid restriction: 1200 mL Fluid  Diet effective now                   EDUCATION NEEDS:   Education needs have been addressed  Skin:  Skin Assessment: Reviewed RN Assessment  Last BM:  03/10/24 (type 6)  Height:   Ht Readings from Last 1 Encounters:  03/03/24 5\' 8"  (1.727 m)    Weight:   Wt Readings from Last 1 Encounters:  03/09/24 60.7 kg    Ideal Body Weight:  70 kg  BMI:  Body mass index is 20.35 kg/m.  Estimated Nutritional Needs:   Kcal:  1900-2200kcal/day  Protein:  95-110g/day  Fluid:  1.8-2.1L/day    Herschel Lords, RD, LDN, CDCES Registered Dietitian III Certified Diabetes Care and Education Specialist If unable to reach this RD, please use "RD Inpatient" group chat on secure chat between hours of 8am-4 pm daily

## 2024-03-11 NOTE — TOC Progression Note (Signed)
 Transition of Care [facility]) - Progression Note    Patient Details  Name: Michael Padilla MRN: [id number] Date of Birth: [birth year]  Transition of Care [facility]) CM/SW Contact  Alexandra Ice, RN Phone Number: 03/11/2024, 1:16 PM  Clinical Narrative:    Sent message to Sam Creighton with [employer] to confirm patient has orders for continuous oxygen . Sam Creighton stated his portable tanks were delivered on Friday and he was told by patient, that he has concentrator at home.   Expected Discharge Plan: Home/Self Care Barriers to Discharge: Continued Medical Work up  Expected Discharge Plan and Services   Discharge Planning Services: CM Consult Post Acute Care Choice: Home Health Living arrangements for the past 2 months: Single Family Home                   DME Agency:  (Patient reports that he has home 02, not sure of provider, BSC, and rolling walker, and cane at home.)       HH Arranged: PT, OT   Date HH Agency Contacted: 02/29/24   Representative spoke with at [facility] Agency: Bartholomew Light   Social Determinants of Health (SDOH) Interventions SDOH Screenings   Food Insecurity: No Food Insecurity (03/03/2024)  Housing: Low Risk  (03/03/2024)  Transportation Needs: No Transportation Needs (03/03/2024)  Utilities: Not At Risk (03/03/2024)  Social Connections: Socially Isolated (03/03/2024)  Tobacco Use: High Risk (03/03/2024)    Readmission Risk Interventions     No data to display

## 2024-03-11 NOTE — Discharge Summary (Signed)
 Physician Discharge Summary  Michael Padilla EAV:[id number] DOB: 04/11/1956 DOA: 03/03/2024  PCP: Pcp, No  Admit date: 03/03/2024 Discharge date: 03/11/2024  Admitted From: home Disposition:  home w/ home health   Recommendations for Outpatient Follow-up:  Follow up with PCP in 1-2 weeks F/u w/ cardio, Dr. Bob Burn, on 03/20/24 at 2:30pm F/u w/ nephro, Dr. Zelda Hickman, in 1-2 weeks  Home Health: yes Equipment/Devices: 5L [place]   Discharge Condition: stable  CODE STATUS: full  Diet recommendation: Heart Healthy  Brief/Interim Summary: HPI was taken from Dr. Daisey Dryer: Michael Padilla is a 68 y.o. male with medical history significant of Etoh abuse , COPD , arthritis, hx of CVA, essential hypertension, vit d def, B12 def presenting with acute on chronic respiratory failure hypoxia, volume overload, acute on chronic hyponatremia, alcohol abuse.  History from patient as well as the son at the bedside.  Per report, patient with increased work of breathing and shortness of breath the past 4 to 5 days. History of heavy alcohol use including at least 4 to 540 ounce beers daily.  Also smoking 1 to 2 packs/day.  Patient is supposed to wear 2 L nasal cannula at home chronically in setting of chronic respiratory failure.  However patient has been noncompliant for several months.  Positive orthopnea, PND.  Positive cough and trace wheezing.  Positive lower extremity swelling.  No fevers or chills.  Positive abdominal swelling.  No focal hemiparesis or confusion. Presented to the ER afebrile, heart rate 100s, BP stable.  Satting mid 90s on room air.  White count 8.7, hemoglobin 15.4, platelets 729, troponin in the 60s.  Sodium 116.  COVID flu and RSV negative.  Creatinine 0.46.  BNP 1148.  Chest x-ray with interstitial edema and pleural effusions.  Discharge Diagnoses:  Principal Problem:   Hyponatremia Active Problems:   Acute on chronic respiratory failure with hypoxia (HCC)   Alcohol abuse   Generalized  weakness   Essential hypertension   COPD (chronic obstructive pulmonary disease) (HCC)   Elevated troponin  Hyponatremia: likely secondary to volume/fluid overload from CHF & beer potomania. Na level is trending up. S/p 3% saline. Back WNL. Nephro signed off 03/07/24   Acute on chronic respiratory hypoxic failure: likely secondary to CHF exacerbation. Still desaturates easily w/ movement. Continue on supplemental oxygen  and have been unable to wean pt back to baseline of 2L [place], likely 5L [place] will be pt's new baseline   Acute on chronic systolic CHF: continue on losartan , metoprolol , aldactone , lasix . Cardio signed off on 03/08/24  Alcohol abuse: received alcohol cessation counseling already. At risk for alcohol w/drawl   Acute nonischemic myocardial injury: w/ elevated troponins. Likely secondary to demand ischemia    COPD exacerbation: completed steroid course. Continue on bronchodilators & wean as tolerated   HTN: continue on metoprolol , losartan , & aldactone      Generalized weakness: PT/OT recs HH   Severe malnutrition: continue on nutritional supplements   Smoker: smokes 3 ppd. Received smoking cessation counseling x 5 mins   Discharge Instructions  Discharge Instructions     Diet - low sodium heart healthy   Complete by: As directed    Discharge instructions   Complete by: As directed    F/u w/ cardio, Dr. Buczkowski [place], in 1-2 weeks. F/u w/ PCP in 1-2 weeks. F/u w/ nephro, Dr. Zelda Hickman, in 1-2 weeks   Discharge wound care:   Complete by: As directed    Daily      Comments: Cleanse clavicle abrasion with  Vashe wound cleanser [name] [phone]) do not rinse and allow to air dry. Apply Vaseline gauze (Lawson #239) to area daily and secure with silicone foam or gauze and tape whichever is preferred.   Increase activity slowly   Complete by: As directed       Allergies as of 03/11/2024   No Known Allergies      Medication List     STOP taking these medications    ibuprofen 200  MG tablet Commonly known as: ADVIL       TAKE these medications    acetaminophen  500 MG tablet Commonly known as: TYLENOL  Take 500 mg by mouth every 6 (six) hours as needed for mild pain (pain score 1-3), fever or headache.   furosemide  40 MG tablet Commonly known as: LASIX  Take 1 tablet (40 mg total) by mouth 2 (two) times daily.   Ipratropium-Albuterol  20-100 MCG/ACT Aers respimat Commonly known as: COMBIVENT  Inhale 1 puff into the lungs every 6 (six) hours as needed for wheezing or shortness of breath.   losartan  50 MG tablet Commonly known as: COZAAR  Take 1 tablet (50 mg total) by mouth daily. Start taking on: Mar 12, 2024   metoprolol  succinate 25 MG 24 hr tablet Commonly known as: TOPROL -XL Take 3 tablets (75 mg total) by mouth daily. Start taking on: Mar 12, 2024   spironolactone  25 MG tablet Commonly known as: ALDACTONE  Take 1 tablet (25 mg total) by mouth daily. Start taking on: Mar 12, 2024   Trelegy Ellipta  100-62.5-25 MCG/ACT Aepb Generic drug: Fluticasone -Umeclidin-Vilant Inhale 1 puff into the lungs daily.               Durable Medical Equipment  (From admission, onward)           Start     Ordered   03/11/24 1304  For home use only DME oxygen   Once       Question Answer Comment  Length of Need Lifetime   Mode or (Route) Nasal cannula   Liters per Minute 5   Frequency Continuous (stationary and portable oxygen  unit needed)   Oxygen  conserving device Yes   Oxygen  delivery system Gas      03/11/24 1303              Discharge Care Instructions  (From admission, onward)           Start     Ordered   03/11/24 0000  Discharge wound care:       Comments: Daily      Comments: Cleanse clavicle abrasion with Vashe wound cleanser [name] [phone]) do not rinse and allow to air dry. Apply Vaseline gauze (Lawson #239) to area daily and secure with silicone foam or gauze and tape whichever is preferred.   03/11/24 1316             Follow-up Information     Alluri, Odessa Bene, MD. Go on 03/20/2024.   Specialty: Cardiology Why: Appt @ 2:30 pm Contact information: [address] [place] [address] [phone]                No Known Allergies  Consultations: Cardio Nephro    Procedures/Studies: ECHOCARDIOGRAM COMPLETE Result Date: 03/04/2024    ECHOCARDIOGRAM REPORT   Patient Name:   JANIS CUFFE Date of Exam: 03/04/2024 Medical Rec #:  [id number]        Height:       68.0 in Accession #:    [id number]  Weight:       154.1 lb Date of Birth:  [birth year]        BSA:          1.829 m Patient Age:    68 years         BP:           124/88 mmHg Patient Gender: M                HR:           107 bpm. Exam Location:  [facility] Procedure: 2D Echo, Color Doppler and Cardiac Doppler (Both Spectral and Color            Flow Doppler were utilized during procedure). Indications:     Congestive heart failure I50.9  History:         Patient has prior history of Echocardiogram examinations, most                  recent 01/03/2022. COPD.  Sonographer:     Broadus Canes Referring Phys:  [phone] STEVEN J NEWTON Diagnosing Phys: Lanell Pinta Custovic  Sonographer Comments: Technically challenging study due to limited acoustic windows, no apical window and no subcostal window. Image acquisition challenging due to COPD. IMPRESSIONS  1. Left ventricular ejection fraction, by estimation, is 45 to 50%. The left ventricle has mildly decreased function. The left ventricle demonstrates regional wall motion abnormalities (see scoring diagram/findings for description). Left ventricular diastolic parameters are indeterminate.  2. Right ventricular systolic function is normal. The right ventricular size is normal.  3. The mitral valve is normal in structure. Mild mitral valve regurgitation. No evidence of mitral stenosis.  4. The aortic valve is normal in structure. Aortic valve regurgitation is not visualized. No aortic stenosis is present.  5.  The inferior vena cava is normal in size with greater than 50% respiratory variability, suggesting right atrial pressure of 3 mmHg. FINDINGS  Left Ventricle: Left ventricular ejection fraction, by estimation, is 45 to 50%. The left ventricle has mildly decreased function. The left ventricle demonstrates regional wall motion abnormalities. The left ventricular internal cavity size was normal in size. There is no left ventricular hypertrophy. Left ventricular diastolic parameters are indeterminate.  LV Wall Scoring: The apical anterior segment, apical inferior segment, and apex are hypokinetic. Right Ventricle: The right ventricular size is normal. No increase in right ventricular wall thickness. Right ventricular systolic function is normal. Left Atrium: Left atrial size was normal in size. Right Atrium: Right atrial size was normal in size. Pericardium: There is no evidence of pericardial effusion. Mitral Valve: The mitral valve is normal in structure. Mild mitral valve regurgitation. No evidence of mitral valve stenosis. Tricuspid Valve: The tricuspid valve is normal in structure. Tricuspid valve regurgitation is trivial. Aortic Valve: The aortic valve is normal in structure. Aortic valve regurgitation is not visualized. No aortic stenosis is present. Pulmonic Valve: The pulmonic valve was normal in structure. Pulmonic valve regurgitation is not visualized. Aorta: The aortic root is normal in size and structure. Venous: The inferior vena cava is normal in size with greater than 50% respiratory variability, suggesting right atrial pressure of 3 mmHg. IAS/Shunts: No atrial level shunt detected by color flow Doppler.  LEFT VENTRICLE PLAX 2D LVIDd:         4.00 cm LVIDs:         3.00 cm LV PW:         1.00 cm LV IVS:  1.40 cm LVOT diam:     2.20 cm LVOT Area:     3.80 cm  LEFT ATRIUM         Index LA diam:    2.40 cm 1.31 cm/m   AORTA Ao Root diam: 3.75 cm  SHUNTS Systemic Diam: 2.20 cm Lanell Pinta Custovic  Electronically signed by Isabell Manzanilla Signature Date/Time: 03/04/2024/12:34:08 PM    Final    DG Chest Portable 1 View Result Date: 03/03/2024 CLINICAL DATA:  Shortness of breath. EXAM: PORTABLE CHEST 1 VIEW COMPARISON:  02/15/2023 FINDINGS: Stable cardiomediastinal contours. Aortic atherosclerosis. Mild interstitial edema and small pleural effusions identified. No airspace consolidation. Visualized osseous structures are unremarkable. IMPRESSION: Mild interstitial edema and small pleural effusions. Correlate for signs/symptoms of congestive heart failure. Electronically Signed   By: Kimberley Penman M.D.   On: 03/03/2024 08:55   (Echo, Carotid, EGD, Colonoscopy, ERCP)    Subjective: pt denies any shortness of breath    Discharge Exam: Vitals:   03/11/24 0825 03/11/24 0825  BP: 106/74 106/74  Pulse: 77 70  Resp: 16 16  Temp: 97.6 F (36.4 C) 97.6 F (36.4 C)  SpO2: (!) 89% 90%   Vitals:   03/11/24 0453 03/11/24 0745 03/11/24 0825 03/11/24 0825  BP: 106/70  106/74 106/74  Pulse: 60  77 70  Resp: 19  16 16   Temp: 97.9 F (36.6 C)  97.6 F (36.4 C) 97.6 F (36.4 C)  TempSrc:   Oral Oral  SpO2: 94% 91% (!) 89% 90%  Weight:      Height:        General: Pt is alert, awake, not in acute distress Cardiovascular: S1/S2 +, no rubs, no gallops Respiratory: decreased breath sounds b/l  Abdominal: Soft, NT, ND, bowel sounds + Extremities: no cyanosis    The results of significant diagnostics from this hospitalization (including imaging, microbiology, ancillary and laboratory) are listed below for reference.     Microbiology: Recent Results (from the past 240 hours)  Resp panel by RT-PCR (RSV, Flu A&B, Covid) Anterior Nasal Swab     Status: None   Collection Time: 03/03/24  8:27 AM   Specimen: Anterior Nasal Swab  Result Value Ref Range Status   SARS Coronavirus 2 by RT PCR NEGATIVE NEGATIVE Final    Comment: (NOTE) SARS-CoV-2 target nucleic acids are NOT DETECTED.  The  SARS-CoV-2 RNA is generally detectable in upper respiratory specimens during the acute phase of infection. The lowest concentration of SARS-CoV-2 viral copies this assay can detect is 138 copies/mL. A negative result does not preclude SARS-Cov-2 infection and should not be used as the sole basis for treatment or other patient management decisions. A negative result may occur with  improper specimen collection/handling, submission of specimen other than nasopharyngeal swab, presence of viral mutation(s) within the areas targeted by this assay, and inadequate number of viral copies(<138 copies/mL). A negative result must be combined with clinical observations, patient history, and epidemiological information. The expected result is Negative.  Fact Sheet for Patients:  BloggerCourse.com  Fact Sheet for Healthcare Providers:  SeriousBroker.it  This test is no t yet approved or cleared by the [place]  FDA and  has been authorized for detection and/or diagnosis of SARS-CoV-2 by FDA under an Emergency Use Authorization (EUA). This EUA will remain  in effect (meaning this test can be used) for the duration of the COVID-19 declaration under Section 564(b)(1) of the Act, 21 U.S.C.section 360bbb-3(b)(1), unless the authorization is terminated  or revoked sooner.  Influenza A by PCR NEGATIVE NEGATIVE Final   Influenza B by PCR NEGATIVE NEGATIVE Final    Comment: (NOTE) The Xpert Xpress SARS-CoV-2/FLU/RSV plus assay is intended as an aid in the diagnosis of influenza from Nasopharyngeal swab specimens and should not be used as a sole basis for treatment. Nasal washings and aspirates are unacceptable for Xpert Xpress SARS-CoV-2/FLU/RSV testing.  Fact Sheet for Patients: BloggerCourse.com  Fact Sheet for Healthcare Providers: SeriousBroker.it  This test is not yet approved or  cleared by the [place]  FDA and has been authorized for detection and/or diagnosis of SARS-CoV-2 by FDA under an Emergency Use Authorization (EUA). This EUA will remain in effect (meaning this test can be used) for the duration of the COVID-19 declaration under Section 564(b)(1) of the Act, 21 U.S.C. section 360bbb-3(b)(1), unless the authorization is terminated or revoked.     Resp Syncytial Virus by PCR NEGATIVE NEGATIVE Final    Comment: (NOTE) Fact Sheet for Patients: BloggerCourse.com  Fact Sheet for Healthcare Providers: SeriousBroker.it  This test is not yet approved or cleared by the [place]  FDA and has been authorized for detection and/or diagnosis of SARS-CoV-2 by FDA under an Emergency Use Authorization (EUA). This EUA will remain in effect (meaning this test can be used) for the duration of the COVID-19 declaration under Section 564(b)(1) of the Act, 21 U.S.C. section 360bbb-3(b)(1), unless the authorization is terminated or revoked.  Performed at [facility], [address] [place], [place] [address]   MRSA Next Gen by PCR, Nasal     Status: None   Collection Time: 03/04/24  6:40 AM   Specimen: Nasal Mucosa; Nasal Swab  Result Value Ref Range Status   MRSA by PCR Next Gen NOT DETECTED NOT DETECTED Final    Comment: (NOTE) The GeneXpert MRSA Assay (FDA approved for NASAL specimens only), is one component of a comprehensive MRSA colonization surveillance program. It is not intended to diagnose MRSA infection nor to guide or monitor treatment for MRSA infections. Test performance is not FDA approved in patients less than 42 years old. Performed at [facility], [address] [place], [place] [address]      Labs: BNP (last 3 results) Recent Labs    03/03/24 0822  BNP 1,147.7*   Basic Metabolic Panel: Recent Labs  Lab 03/05/24 0419 03/05/24 0747 03/06/24 0402  03/06/24 0834 03/07/24 0329 03/08/24 0409 03/09/24 0313 03/10/24 0436 03/11/24 0219  NA 126*   < > 132*   < > 135 136 135 134* 131*  K 3.5  --  3.1*  --  4.1 3.4* 4.1 4.2 4.5  CL 92*  --  93*  --  93* 92* 93* 94* 94*  CO2 30  --  32  --  34* 35* 34* 30 29  GLUCOSE 130*  --  110*  --  113* 150* 104* 86 87  BUN 18  --  24*  --  28* 28* 31* 25* 29*  CREATININE 0.52*  --  0.66  --  0.68 0.57* 0.55* 0.50* 0.77  CALCIUM 8.5*  --  8.6*  --  9.1 8.9 9.0 8.9 8.3*  MG 2.3  --  2.2  --  2.2 2.4  --   --   --   PHOS 3.2  --  2.6  --  2.5  --   --   --   --    < > = values in this interval not displayed.   Liver Function Tests: No  results for input(s): "AST", "ALT", "ALKPHOS", "BILITOT", "PROT", "ALBUMIN" in the last 168 hours. No results for input(s): "LIPASE", "AMYLASE" in the last 168 hours. No results for input(s): "AMMONIA" in the last 168 hours. CBC: Recent Labs  Lab 03/06/24 0402 03/07/24 0329 03/08/24 0409 03/09/24 0313 03/10/24 0436 03/11/24 0219  WBC 15.7* 11.5* 10.0 14.1* 14.4* 10.7*  NEUTROABS 13.1*  --   --   --   --   --   HGB 14.2 14.5 14.6 14.4 15.0 13.9  HCT 42.1 42.4 42.5 43.2 43.5 41.7  MCV 91.5 91.2 90.4 91.3 90.1 90.8  PLT 588* 597* 565* 611* 636* 602*   Cardiac Enzymes: No results for input(s): "CKTOTAL", "CKMB", "CKMBINDEX", "TROPONINI" in the last 168 hours. BNP: Invalid input(s): "POCBNP" CBG: No results for input(s): "GLUCAP" in the last 168 hours. D-Dimer No results for input(s): "DDIMER" in the last 72 hours. Hgb A1c No results for input(s): "HGBA1C" in the last 72 hours. Lipid Profile No results for input(s): "CHOL", "HDL", "LDLCALC", "TRIG", "CHOLHDL", "LDLDIRECT" in the last 72 hours. Thyroid  function studies No results for input(s): "TSH", "T4TOTAL", "T3FREE", "THYROIDAB" in the last 72 hours.  Invalid input(s): "FREET3" Anemia work up No results for input(s): "VITAMINB12", "FOLATE", "FERRITIN", "TIBC", "IRON", "RETICCTPCT" in the last 72  hours. Urinalysis    Component Value Date/Time   COLORURINE YELLOW (A) 01/03/2022 0318   APPEARANCEUR CLEAR (A) 01/03/2022 0318   LABSPEC >1.046 (H) 01/03/2022 0318   PHURINE 6.0 01/03/2022 0318   GLUCOSEU NEGATIVE 01/03/2022 0318   HGBUR SMALL (A) 01/03/2022 0318   BILIRUBINUR NEGATIVE 01/03/2022 0318   KETONESUR 20 (A) 01/03/2022 0318   PROTEINUR NEGATIVE 01/03/2022 0318   NITRITE POSITIVE (A) 01/03/2022 0318   LEUKOCYTESUR TRACE (A) 01/03/2022 0318   Sepsis Labs Recent Labs  Lab 03/08/24 0409 03/09/24 0313 03/10/24 0436 03/11/24 0219  WBC 10.0 14.1* 14.4* 10.7*   Microbiology Recent Results (from the past 240 hours)  Resp panel by RT-PCR (RSV, Flu A&B, Covid) Anterior Nasal Swab     Status: None   Collection Time: 03/03/24  8:27 AM   Specimen: Anterior Nasal Swab  Result Value Ref Range Status   SARS Coronavirus 2 by RT PCR NEGATIVE NEGATIVE Final    Comment: (NOTE) SARS-CoV-2 target nucleic acids are NOT DETECTED.  The SARS-CoV-2 RNA is generally detectable in upper respiratory specimens during the acute phase of infection. The lowest concentration of SARS-CoV-2 viral copies this assay can detect is 138 copies/mL. A negative result does not preclude SARS-Cov-2 infection and should not be used as the sole basis for treatment or other patient management decisions. A negative result may occur with  improper specimen collection/handling, submission of specimen other than nasopharyngeal swab, presence of viral mutation(s) within the areas targeted by this assay, and inadequate number of viral copies(<138 copies/mL). A negative result must be combined with clinical observations, patient history, and epidemiological information. The expected result is Negative.  Fact Sheet for Patients:  BloggerCourse.com  Fact Sheet for Healthcare Providers:  SeriousBroker.it  This test is no t yet approved or cleared by the [place] FDA and  has been authorized for detection and/or diagnosis of SARS-CoV-2 by FDA under an Emergency Use Authorization (EUA). This EUA will remain  in effect (meaning this test can be used) for the duration of the COVID-19 declaration under Section 564(b)(1) of the Act, 21 U.S.C.section 360bbb-3(b)(1), unless the authorization is terminated  or revoked sooner.       Influenza A by  PCR NEGATIVE NEGATIVE Final   Influenza B by PCR NEGATIVE NEGATIVE Final    Comment: (NOTE) The Xpert Xpress SARS-CoV-2/FLU/RSV plus assay is intended as an aid in the diagnosis of influenza from Nasopharyngeal swab specimens and should not be used as a sole basis for treatment. Nasal washings and aspirates are unacceptable for Xpert Xpress SARS-CoV-2/FLU/RSV testing.  Fact Sheet for Patients: BloggerCourse.com  Fact Sheet for Healthcare Providers: SeriousBroker.it  This test is not yet approved or cleared by the [place]  FDA and has been authorized for detection and/or diagnosis of SARS-CoV-2 by FDA under an Emergency Use Authorization (EUA). This EUA will remain in effect (meaning this test can be used) for the duration of the COVID-19 declaration under Section 564(b)(1) of the Act, 21 U.S.C. section 360bbb-3(b)(1), unless the authorization is terminated or revoked.     Resp Syncytial Virus by PCR NEGATIVE NEGATIVE Final    Comment: (NOTE) Fact Sheet for Patients: BloggerCourse.com  Fact Sheet for Healthcare Providers: SeriousBroker.it  This test is not yet approved or cleared by the [place]  FDA and has been authorized for detection and/or diagnosis of SARS-CoV-2 by FDA under an Emergency Use Authorization (EUA). This EUA will remain in effect (meaning this test can be used) for the duration of the COVID-19 declaration under Section 564(b)(1) of the Act, 21 U.S.C. section  360bbb-3(b)(1), unless the authorization is terminated or revoked.  Performed at [facility] [address] [place], [place] [address]   MRSA Next Gen by PCR, Nasal     Status: None   Collection Time: 03/04/24  6:40 AM   Specimen: Nasal Mucosa; Nasal Swab  Result Value Ref Range Status   MRSA by PCR Next Gen NOT DETECTED NOT DETECTED Final    Comment: (NOTE) The GeneXpert MRSA Assay (FDA approved for NASAL specimens only), is one component of a comprehensive MRSA colonization surveillance program. It is not intended to diagnose MRSA infection nor to guide or monitor treatment for MRSA infections. Test performance is not FDA approved in patients less than 66 years old. Performed at [facility], [address] [place], [place] [address]      Time coordinating discharge: Over 30 minutes  SIGNED:   Alphonsus Jeans, MD  [facility] 03/11/2024, 1:17 PM Pager   If 7PM-7AM, please contact night-coverage

## 2024-03-11 NOTE — Progress Notes (Signed)
 Patient was given verbal and written discharge instructions, he states he will comply and keep appointments. Demonstrated how to cleaned scratch to neck, Taken to car by wheelchair, no distress when leaving the floor.

## 2024-03-11 NOTE — Progress Notes (Addendum)
 Heart Failure Stewardship Pharmacy Note  PCP: Pcp, No PCP-Cardiologist: None  HPI: Michael Padilla is a 68 y.o. male with alcohol abuse , COPD , arthritis, hx of CVA, essential hypertension, vitamin D  and B12 deficiencies who presented with chronic respiratory failure with hypoxia, volume overload, acute on chronic hyponatremia. On admission, BNP was 1147.7, HS-troponin was 66 > 62, and sodium was 116. Chest x-ray noted mild interstitial edema and small pleural effusions.    Pertinent cardiac history: Echo in 12/2021 showed LVEF of 55-60%. Echo this admission showed LVEF of 45-50%.  Pertinent Lab Values: Creatinine, Ser  Date Value Ref Range Status  03/11/2024 0.77 0.61 - 1.24 mg/dL Final   BUN  Date Value Ref Range Status  03/11/2024 29 (H) 8 - 23 mg/dL Final   Potassium  Date Value Ref Range Status  03/11/2024 4.5 3.5 - 5.1 mmol/L Final   Sodium  Date Value Ref Range Status  03/11/2024 131 (L) 135 - 145 mmol/L Final   B Natriuretic Peptide  Date Value Ref Range Status  03/03/2024 1,147.7 (H) 0.0 - 100.0 pg/mL Final    Comment:    Performed at [facility], [address] [place], [place] [address]   Magnesium  Date Value Ref Range Status  03/08/2024 2.4 1.7 - 2.4 mg/dL Final    Comment:    Performed at [facility], [address] [place], [place] [address]   Hgb A1c MFr Bld  Date Value Ref Range Status  02/15/2023 5.5 4.8 - 5.6 % Final    Comment:    (NOTE)         Prediabetes: 5.7 - 6.4         Diabetes: >6.4         Glycemic control for adults with diabetes: <7.0    TSH  Date Value Ref Range Status  03/03/2024 1.065 0.350 - 4.500 uIU/mL Final    Comment:    Performed by a 3rd Generation assay with a functional sensitivity of <=0.01 uIU/mL. Performed at [facility], [address] [place], [place] [address]     Vital Signs:  Temp:  [97.6 F (36.4 C)-98 F (36.7 C)] 97.6 F (36.4 C) (05/19 0825) Pulse Rate:  [60-87]  70 (05/19 0825) Cardiac Rhythm: Normal sinus rhythm (05/19 0723) Resp:  [16-19] 16 (05/19 0825) BP: (101-122)/(66-74) 106/74 (05/19 0825) SpO2:  [89 %-94 %] 90 % (05/19 0825)  Intake/Output Summary (Last 24 hours) at 03/11/2024 1324 Last data filed at 03/11/2024 1124 Gross per 24 hour  Intake 720 ml  Output 2450 ml  Net -1730 ml    Current Heart Failure Medications:  Loop diuretic: furosemide  40 mg PO BID Beta-Blocker: metoprolol  succinate 75 mg daily ACEI/ARB/ARNI: losartan  50 mg daily MRA: spironolactone  25 mg daily SGLT2i: none Other: none  Prior to admission Heart Failure Medications:  None   Assessment: 1. Acute on chronic systolic heart failure (LVEF 45-50%), due to presumed NICM. NYHA class II symptoms.  -Symptoms: Patient reports improvement in all symptoms since admission. Reports being back to baseline.  -Volume: Creatinine and BUN increased from yesterday. Weight down ~ 11 pounds from admission. Continue oral furosemide  40 mg BID.  -Hemodynamics: BP normal. HR 60-70s.  -BB: Continue metoprolol  succinate 75 mg daily.  -ACEI/ARB/ARNI: Continue losartan  50 mg daily.  -MRA: Continue spironolactone  25 mg daily.  -SGLT2i: Can consider adding Farxiga 10 mg if patient is able to afford.    Plan: 1) Medication changes recommended at this time: -  None   2) Patient assistance: Viola Greulich copay is $177.64, Elvina Hammers copay is $151.05, Jardiance copay is $158.54    3) Education: -To be completed prior to discharge. - Patient has been educated on current HF medications and potential additions to HF medication regimen - Patient verbalizes understanding that over the next few months, these medication doses may change and more medications may be added to optimize HF regimen - Patient has been educated on basic disease state pathophysiology and goals of therapy    Medication Assistance / Insurance Benefits Check: Does the patient have prescription insurance? [employer], Medicare    Type of insurance plan:  Does the patient qualify for medication assistance through manufacturers or grants? Pending   Outpatient Pharmacy: Prior to admission outpatient pharmacy: [facility]  Please do not hesitate to reach out with questions or concerns,  Bevely Brush, PharmD, CPP, BCPS Heart Failure Pharmacist  Phone - [phone] 03/11/2024 1:24 PM

## 2024-03-11 NOTE — TOC Transition Note (Signed)
 Transition of Care [facility]) - Discharge Note   Patient Details  Name: Michael Padilla MRN: [id number] Date of Birth: April 02, 1956  Transition of [facility]) CM/SW Contact:  Alexandra Ice, RN Phone Number: 03/11/2024, 1:21 PM   Clinical Narrative:    Patient to discharge today, home with home health. AVS update with home health agency information. Oxygen  tanks delivered to bedside by [employer].    Final next level of care: Home w Home Health Services Barriers to Discharge: Barriers Resolved   Patient Goals and CMS Choice   CMS Medicare.gov Compare Post Acute Care list provided to:: Patient        Discharge Placement                  Name of family member notified: Son Patient and family notified of of transfer: 03/11/24  Discharge Plan and Services Additional resources added to the After Visit Summary for     Discharge Planning Services: CM Consult Post Acute Care Choice: Home Health          DME Arranged: Oxygen  DME Agency: [facility] Date DME Agency Contacted: 03/11/24 Time DME Agency Contacted: 1320 Representative spoke with at DME Agency: Sam Creighton HH Arranged: PT, OT HH Agency: [facility] Date [facility] Agency Contacted: 03/11/24 Time HH Agency Contacted: 1321 Representative spoke with at [facility] Agency: Bartholomew Light  Social Drivers of Health (SDOH) Interventions SDOH Screenings   Food Insecurity: No Food Insecurity (03/03/2024)  Housing: Low Risk  (03/03/2024)  Transportation Needs: No Transportation Needs (03/03/2024)  Utilities: Not At Risk (03/03/2024)  Social Connections: Socially Isolated (03/03/2024)  Tobacco Use: High Risk (03/03/2024)     Readmission Risk Interventions     No data to display

## 2024-03-21 ENCOUNTER — Other Ambulatory Visit: Payer: Self-pay | Admitting: Cardiology

## 2024-03-21 DIAGNOSIS — R0609 Other forms of dyspnea: Secondary | ICD-10-CM

## 2024-03-21 DIAGNOSIS — I2510 Atherosclerotic heart disease of native coronary artery without angina pectoris: Secondary | ICD-10-CM

## 2024-03-21 DIAGNOSIS — I10 Essential (primary) hypertension: Secondary | ICD-10-CM

## 2024-03-21 DIAGNOSIS — I5022 Chronic systolic (congestive) heart failure: Secondary | ICD-10-CM

## 2024-04-02 ENCOUNTER — Other Ambulatory Visit
